# Patient Record
Sex: Female | Born: 1940 | Race: White | Hispanic: No | Marital: Married | Smoking: Never smoker
Health system: Southern US, Community
[De-identification: ages and names within clinical notes are randomized; demographics above are authoritative.]

## PROBLEM LIST (undated history)

## (undated) DIAGNOSIS — R32 Unspecified urinary incontinence: Secondary | ICD-10-CM

## (undated) DIAGNOSIS — H269 Unspecified cataract: Secondary | ICD-10-CM

## (undated) DIAGNOSIS — I10 Essential (primary) hypertension: Secondary | ICD-10-CM

## (undated) DIAGNOSIS — C801 Malignant (primary) neoplasm, unspecified: Secondary | ICD-10-CM

## (undated) DIAGNOSIS — M1990 Unspecified osteoarthritis, unspecified site: Secondary | ICD-10-CM

## (undated) DIAGNOSIS — J9809 Other diseases of bronchus, not elsewhere classified: Secondary | ICD-10-CM

## (undated) DIAGNOSIS — G629 Polyneuropathy, unspecified: Secondary | ICD-10-CM

## (undated) DIAGNOSIS — E785 Hyperlipidemia, unspecified: Secondary | ICD-10-CM

## (undated) DIAGNOSIS — I2699 Other pulmonary embolism without acute cor pulmonale: Secondary | ICD-10-CM

## (undated) DIAGNOSIS — R269 Unspecified abnormalities of gait and mobility: Secondary | ICD-10-CM

## (undated) DIAGNOSIS — E119 Type 2 diabetes mellitus without complications: Secondary | ICD-10-CM

## (undated) HISTORY — PX: BACK SURGERY: SHX140

## (undated) HISTORY — DX: Type 2 diabetes mellitus without complications: E11.9

## (undated) HISTORY — DX: Essential (primary) hypertension: I10

## (undated) HISTORY — PX: BLADDER SURGERY: SHX569

## (undated) HISTORY — DX: Hyperlipidemia, unspecified: E78.5

## (undated) HISTORY — DX: Unspecified osteoarthritis, unspecified site: M19.90

## (undated) HISTORY — PX: ABDOMINAL HYSTERECTOMY: SHX81

## (undated) HISTORY — PX: CHOLECYSTECTOMY: SHX55

## (undated) HISTORY — PX: EYE SURGERY: SHX253

## (undated) HISTORY — DX: Unspecified cataract: H26.9

## (undated) HISTORY — PX: APPENDECTOMY: SHX54

## (undated) HISTORY — PX: JOINT REPLACEMENT: SHX530

## (undated) HISTORY — PX: KNEE SURGERY: SHX244

---

## 2003-10-02 ENCOUNTER — Other Ambulatory Visit: Payer: Self-pay

## 2005-03-09 ENCOUNTER — Other Ambulatory Visit: Payer: Self-pay

## 2005-03-09 ENCOUNTER — Inpatient Hospital Stay: Payer: Self-pay | Admitting: Internal Medicine

## 2006-03-28 ENCOUNTER — Ambulatory Visit: Payer: Self-pay | Admitting: Podiatry

## 2006-03-28 ENCOUNTER — Other Ambulatory Visit: Payer: Self-pay

## 2006-04-01 ENCOUNTER — Ambulatory Visit: Payer: Self-pay | Admitting: Podiatry

## 2008-02-08 ENCOUNTER — Emergency Department: Payer: Self-pay | Admitting: Emergency Medicine

## 2009-01-10 ENCOUNTER — Ambulatory Visit: Payer: Self-pay | Admitting: General Surgery

## 2009-06-02 ENCOUNTER — Ambulatory Visit: Payer: Self-pay | Admitting: General Surgery

## 2009-09-28 ENCOUNTER — Ambulatory Visit: Payer: Self-pay | Admitting: Gynecologic Oncology

## 2009-09-30 ENCOUNTER — Ambulatory Visit: Payer: Self-pay | Admitting: Gynecologic Oncology

## 2009-10-29 ENCOUNTER — Ambulatory Visit: Payer: Self-pay | Admitting: Gynecologic Oncology

## 2009-11-04 ENCOUNTER — Ambulatory Visit: Payer: Self-pay | Admitting: Gynecologic Oncology

## 2009-11-06 ENCOUNTER — Observation Stay: Payer: Self-pay | Admitting: Obstetrics & Gynecology

## 2009-11-15 ENCOUNTER — Inpatient Hospital Stay: Payer: Self-pay | Admitting: Internal Medicine

## 2009-11-28 ENCOUNTER — Ambulatory Visit: Payer: Self-pay | Admitting: Gynecologic Oncology

## 2009-12-16 ENCOUNTER — Ambulatory Visit: Payer: Self-pay | Admitting: Gynecologic Oncology

## 2009-12-29 ENCOUNTER — Ambulatory Visit: Payer: Self-pay | Admitting: Gynecologic Oncology

## 2010-06-04 ENCOUNTER — Ambulatory Visit: Payer: Self-pay | Admitting: General Surgery

## 2010-08-27 ENCOUNTER — Ambulatory Visit: Payer: Self-pay | Admitting: Family Medicine

## 2010-09-01 ENCOUNTER — Ambulatory Visit: Payer: Self-pay | Admitting: Family Medicine

## 2010-09-11 ENCOUNTER — Ambulatory Visit: Payer: Self-pay | Admitting: Family Medicine

## 2010-09-24 ENCOUNTER — Ambulatory Visit: Payer: Self-pay | Admitting: Pain Medicine

## 2010-10-05 ENCOUNTER — Ambulatory Visit: Payer: Self-pay | Admitting: Pain Medicine

## 2010-11-10 ENCOUNTER — Ambulatory Visit: Payer: Self-pay | Admitting: Pain Medicine

## 2010-11-16 ENCOUNTER — Ambulatory Visit: Payer: Self-pay | Admitting: Pain Medicine

## 2010-12-09 ENCOUNTER — Other Ambulatory Visit (HOSPITAL_COMMUNITY): Payer: Self-pay | Admitting: Neurological Surgery

## 2010-12-09 ENCOUNTER — Encounter (HOSPITAL_COMMUNITY)
Admission: RE | Admit: 2010-12-09 | Discharge: 2010-12-09 | Disposition: A | Discharge status: Home / Self Care | Payer: Medicare Other | Source: Ambulatory Visit | Attending: Neurological Surgery | Admitting: Neurological Surgery

## 2010-12-09 ENCOUNTER — Ambulatory Visit (HOSPITAL_COMMUNITY)
Admission: RE | Admit: 2010-12-09 | Discharge: 2010-12-09 | Disposition: A | Discharge status: Home / Self Care | Payer: Medicare Other | Source: Ambulatory Visit | Attending: Neurological Surgery | Admitting: Neurological Surgery

## 2010-12-09 DIAGNOSIS — M5416 Radiculopathy, lumbar region: Secondary | ICD-10-CM

## 2010-12-09 DIAGNOSIS — M48061 Spinal stenosis, lumbar region without neurogenic claudication: Secondary | ICD-10-CM

## 2010-12-09 DIAGNOSIS — IMO0002 Reserved for concepts with insufficient information to code with codable children: Secondary | ICD-10-CM | POA: Insufficient documentation

## 2010-12-09 DIAGNOSIS — I517 Cardiomegaly: Secondary | ICD-10-CM | POA: Insufficient documentation

## 2010-12-09 DIAGNOSIS — Z01812 Encounter for preprocedural laboratory examination: Secondary | ICD-10-CM | POA: Insufficient documentation

## 2010-12-09 DIAGNOSIS — Z01818 Encounter for other preprocedural examination: Secondary | ICD-10-CM | POA: Insufficient documentation

## 2010-12-09 LAB — CBC
Hemoglobin: 14.1 g/dL (ref 12.0–15.0)
Platelets: 325 10*3/uL (ref 150–400)
RBC: 4.77 MIL/uL (ref 3.87–5.11)
WBC: 12.7 10*3/uL — ABNORMAL HIGH (ref 4.0–10.5)

## 2010-12-09 LAB — BASIC METABOLIC PANEL
CO2: 28 mEq/L (ref 19–32)
Calcium: 9.5 mg/dL (ref 8.4–10.5)
Chloride: 101 mEq/L (ref 96–112)
Glucose, Bld: 87 mg/dL (ref 70–99)
Potassium: 4.4 mEq/L (ref 3.5–5.1)
Sodium: 139 mEq/L (ref 135–145)

## 2010-12-09 LAB — SURGICAL PCR SCREEN
MRSA, PCR: POSITIVE — AB
Staphylococcus aureus: POSITIVE — AB

## 2010-12-10 ENCOUNTER — Inpatient Hospital Stay (HOSPITAL_COMMUNITY)
Admission: RE | Admit: 2010-12-10 | Discharge: 2010-12-14 | DRG: 491 | Disposition: A | Discharge status: Rehabilitation Facility | Payer: Medicare Other | Source: Ambulatory Visit | Attending: Neurological Surgery | Admitting: Neurological Surgery

## 2010-12-10 ENCOUNTER — Inpatient Hospital Stay (HOSPITAL_COMMUNITY): Payer: Medicare Other

## 2010-12-10 DIAGNOSIS — Z7982 Long term (current) use of aspirin: Secondary | ICD-10-CM

## 2010-12-10 DIAGNOSIS — E1142 Type 2 diabetes mellitus with diabetic polyneuropathy: Secondary | ICD-10-CM | POA: Diagnosis present

## 2010-12-10 DIAGNOSIS — M47817 Spondylosis without myelopathy or radiculopathy, lumbosacral region: Principal | ICD-10-CM | POA: Diagnosis present

## 2010-12-10 DIAGNOSIS — M48062 Spinal stenosis, lumbar region with neurogenic claudication: Secondary | ICD-10-CM | POA: Diagnosis present

## 2010-12-10 DIAGNOSIS — Z79899 Other long term (current) drug therapy: Secondary | ICD-10-CM

## 2010-12-10 DIAGNOSIS — I10 Essential (primary) hypertension: Secondary | ICD-10-CM | POA: Diagnosis present

## 2010-12-10 DIAGNOSIS — E1149 Type 2 diabetes mellitus with other diabetic neurological complication: Secondary | ICD-10-CM | POA: Diagnosis present

## 2010-12-10 DIAGNOSIS — Z01812 Encounter for preprocedural laboratory examination: Secondary | ICD-10-CM

## 2010-12-10 DIAGNOSIS — R339 Retention of urine, unspecified: Secondary | ICD-10-CM | POA: Diagnosis not present

## 2010-12-10 LAB — GLUCOSE, CAPILLARY
Glucose-Capillary: 102 mg/dL — ABNORMAL HIGH (ref 70–99)
Glucose-Capillary: 104 mg/dL — ABNORMAL HIGH (ref 70–99)
Glucose-Capillary: 96 mg/dL (ref 70–99)
Glucose-Capillary: 119 mg/dL — ABNORMAL HIGH (ref 70–99)

## 2010-12-11 DIAGNOSIS — IMO0002 Reserved for concepts with insufficient information to code with codable children: Secondary | ICD-10-CM

## 2010-12-11 DIAGNOSIS — M47817 Spondylosis without myelopathy or radiculopathy, lumbosacral region: Secondary | ICD-10-CM

## 2010-12-11 LAB — GLUCOSE, CAPILLARY
Glucose-Capillary: 154 mg/dL — ABNORMAL HIGH (ref 70–99)
Glucose-Capillary: 131 mg/dL — ABNORMAL HIGH (ref 70–99)
Glucose-Capillary: 164 mg/dL — ABNORMAL HIGH (ref 70–99)

## 2010-12-12 LAB — GLUCOSE, CAPILLARY: Glucose-Capillary: 169 mg/dL — ABNORMAL HIGH (ref 70–99)

## 2010-12-13 LAB — GLUCOSE, CAPILLARY
Glucose-Capillary: 139 mg/dL — ABNORMAL HIGH (ref 70–99)
Glucose-Capillary: 124 mg/dL — ABNORMAL HIGH (ref 70–99)
Glucose-Capillary: 109 mg/dL — ABNORMAL HIGH (ref 70–99)

## 2010-12-14 ENCOUNTER — Inpatient Hospital Stay (HOSPITAL_COMMUNITY)
Admission: AD | Admit: 2010-12-14 | Discharge: 2010-12-25 | DRG: 945 | Disposition: A | Discharge status: Home Health | Payer: Medicare Other | Source: Ambulatory Visit | Attending: Physical Medicine & Rehabilitation | Admitting: Physical Medicine & Rehabilitation

## 2010-12-14 DIAGNOSIS — G609 Hereditary and idiopathic neuropathy, unspecified: Secondary | ICD-10-CM | POA: Diagnosis present

## 2010-12-14 DIAGNOSIS — M48061 Spinal stenosis, lumbar region without neurogenic claudication: Secondary | ICD-10-CM | POA: Diagnosis present

## 2010-12-14 DIAGNOSIS — E118 Type 2 diabetes mellitus with unspecified complications: Secondary | ICD-10-CM | POA: Diagnosis present

## 2010-12-14 DIAGNOSIS — R339 Retention of urine, unspecified: Secondary | ICD-10-CM | POA: Diagnosis present

## 2010-12-14 DIAGNOSIS — N390 Urinary tract infection, site not specified: Secondary | ICD-10-CM | POA: Diagnosis not present

## 2010-12-14 DIAGNOSIS — K6289 Other specified diseases of anus and rectum: Secondary | ICD-10-CM | POA: Diagnosis not present

## 2010-12-14 DIAGNOSIS — IMO0002 Reserved for concepts with insufficient information to code with codable children: Secondary | ICD-10-CM | POA: Diagnosis present

## 2010-12-14 DIAGNOSIS — A4902 Methicillin resistant Staphylococcus aureus infection, unspecified site: Secondary | ICD-10-CM | POA: Diagnosis present

## 2010-12-14 DIAGNOSIS — Z5189 Encounter for other specified aftercare: Principal | ICD-10-CM

## 2010-12-14 DIAGNOSIS — I10 Essential (primary) hypertension: Secondary | ICD-10-CM | POA: Diagnosis present

## 2010-12-14 DIAGNOSIS — Z7982 Long term (current) use of aspirin: Secondary | ICD-10-CM

## 2010-12-14 DIAGNOSIS — M1710 Unilateral primary osteoarthritis, unspecified knee: Secondary | ICD-10-CM | POA: Diagnosis present

## 2010-12-14 DIAGNOSIS — F411 Generalized anxiety disorder: Secondary | ICD-10-CM | POA: Diagnosis not present

## 2010-12-14 LAB — URINALYSIS, ROUTINE W REFLEX MICROSCOPIC
Bilirubin Urine: NEGATIVE
Glucose, UA: NEGATIVE mg/dL
Nitrite: NEGATIVE
Specific Gravity, Urine: 1.014 (ref 1.005–1.030)
pH: 6 (ref 5.0–8.0)

## 2010-12-14 LAB — CBC
MCV: 90 fL (ref 78.0–100.0)
Platelets: 290 10*3/uL (ref 150–400)
RBC: 3.89 MIL/uL (ref 3.87–5.11)
WBC: 9.3 10*3/uL (ref 4.0–10.5)

## 2010-12-14 LAB — BASIC METABOLIC PANEL
CO2: 30 mEq/L (ref 19–32)
Chloride: 102 mEq/L (ref 96–112)
Sodium: 138 mEq/L (ref 135–145)

## 2010-12-14 LAB — GLUCOSE, CAPILLARY

## 2010-12-15 DIAGNOSIS — K592 Neurogenic bowel, not elsewhere classified: Secondary | ICD-10-CM

## 2010-12-15 DIAGNOSIS — IMO0002 Reserved for concepts with insufficient information to code with codable children: Secondary | ICD-10-CM

## 2010-12-15 DIAGNOSIS — M47817 Spondylosis without myelopathy or radiculopathy, lumbosacral region: Secondary | ICD-10-CM

## 2010-12-15 DIAGNOSIS — N319 Neuromuscular dysfunction of bladder, unspecified: Secondary | ICD-10-CM

## 2010-12-15 LAB — GLUCOSE, CAPILLARY: Glucose-Capillary: 149 mg/dL — ABNORMAL HIGH (ref 70–99)

## 2010-12-15 LAB — URINE CULTURE
Culture  Setup Time: 201207161439
Culture: NO GROWTH
Special Requests: NEGATIVE

## 2010-12-15 LAB — COMPREHENSIVE METABOLIC PANEL
ALT: 25 U/L (ref 0–35)
CO2: 31 mEq/L (ref 19–32)
Calcium: 9.4 mg/dL (ref 8.4–10.5)
Creatinine, Ser: 0.75 mg/dL (ref 0.50–1.10)
GFR calc Af Amer: >60 mL/min (ref >60)
GFR calc non Af Amer: >60 mL/min (ref >60)
Glucose, Bld: 118 mg/dL — ABNORMAL HIGH (ref 70–99)

## 2010-12-15 LAB — CBC
HCT: 37.5 % (ref 36.0–46.0)
Hemoglobin: 12.4 g/dL (ref 12.0–15.0)
MCH: 29.9 pg (ref 26.0–34.0)
MCV: 90.4 fL (ref 78.0–100.0)
RBC: 4.15 MIL/uL (ref 3.87–5.11)

## 2010-12-15 LAB — DIFFERENTIAL
Lymphs Abs: 3.7 10*3/uL (ref 0.7–4.0)
Monocytes Relative: 7 % (ref 3–12)
Neutro Abs: 4.9 10*3/uL (ref 1.7–7.7)
Neutrophils Relative %: 49 % (ref 43–77)

## 2010-12-16 DIAGNOSIS — K592 Neurogenic bowel, not elsewhere classified: Secondary | ICD-10-CM

## 2010-12-16 DIAGNOSIS — M47817 Spondylosis without myelopathy or radiculopathy, lumbosacral region: Secondary | ICD-10-CM

## 2010-12-16 DIAGNOSIS — N319 Neuromuscular dysfunction of bladder, unspecified: Secondary | ICD-10-CM

## 2010-12-16 DIAGNOSIS — IMO0002 Reserved for concepts with insufficient information to code with codable children: Secondary | ICD-10-CM

## 2010-12-16 LAB — GLUCOSE, CAPILLARY
Glucose-Capillary: 133 mg/dL — ABNORMAL HIGH (ref 70–99)
Glucose-Capillary: 127 mg/dL — ABNORMAL HIGH (ref 70–99)
Glucose-Capillary: 133 mg/dL — ABNORMAL HIGH (ref 70–99)

## 2010-12-17 LAB — GLUCOSE, CAPILLARY: Glucose-Capillary: 128 mg/dL — ABNORMAL HIGH (ref 70–99)

## 2010-12-18 ENCOUNTER — Inpatient Hospital Stay (HOSPITAL_COMMUNITY): Payer: Medicare Other

## 2010-12-18 DIAGNOSIS — K592 Neurogenic bowel, not elsewhere classified: Secondary | ICD-10-CM

## 2010-12-18 DIAGNOSIS — M47817 Spondylosis without myelopathy or radiculopathy, lumbosacral region: Secondary | ICD-10-CM

## 2010-12-18 DIAGNOSIS — IMO0002 Reserved for concepts with insufficient information to code with codable children: Secondary | ICD-10-CM

## 2010-12-18 DIAGNOSIS — N319 Neuromuscular dysfunction of bladder, unspecified: Secondary | ICD-10-CM

## 2010-12-18 LAB — GLUCOSE, CAPILLARY
Glucose-Capillary: 115 mg/dL — ABNORMAL HIGH (ref 70–99)
Glucose-Capillary: 108 mg/dL — ABNORMAL HIGH (ref 70–99)

## 2010-12-19 ENCOUNTER — Inpatient Hospital Stay (HOSPITAL_COMMUNITY): Payer: Medicare Other

## 2010-12-19 LAB — CBC
HCT: 36.8 % (ref 36.0–46.0)
Hemoglobin: 12.4 g/dL (ref 12.0–15.0)
RDW: 14 % (ref 11.5–15.5)
WBC: 12.4 10*3/uL — ABNORMAL HIGH (ref 4.0–10.5)

## 2010-12-19 LAB — URINALYSIS, ROUTINE W REFLEX MICROSCOPIC
Bilirubin Urine: NEGATIVE
Glucose, UA: NEGATIVE mg/dL
Hgb urine dipstick: NEGATIVE
Specific Gravity, Urine: 1.014 (ref 1.005–1.030)
pH: 6.5 (ref 5.0–8.0)

## 2010-12-19 LAB — COMPREHENSIVE METABOLIC PANEL
ALT: 22 U/L (ref 0–35)
AST: 19 U/L (ref 0–37)
Albumin: 2.7 g/dL — ABNORMAL LOW (ref 3.5–5.2)
Alkaline Phosphatase: 66 U/L (ref 39–117)
BUN: 20 mg/dL (ref 6–23)
Chloride: 101 mEq/L (ref 96–112)
Potassium: 3.9 mEq/L (ref 3.5–5.1)
Sodium: 138 mEq/L (ref 135–145)
Total Bilirubin: 0.6 mg/dL (ref 0.3–1.2)
Total Protein: 6.1 g/dL (ref 6.0–8.3)

## 2010-12-19 LAB — SEDIMENTATION RATE: Sed Rate: 27 mm/hr — ABNORMAL HIGH (ref 0–22)

## 2010-12-19 LAB — GLUCOSE, CAPILLARY
Glucose-Capillary: 141 mg/dL — ABNORMAL HIGH (ref 70–99)
Glucose-Capillary: 145 mg/dL — ABNORMAL HIGH (ref 70–99)

## 2010-12-19 LAB — URINE MICROSCOPIC-ADD ON

## 2010-12-19 LAB — CK TOTAL AND CKMB (NOT AT ARMC): Relative Index: 1.6 (ref 0.0–2.5)

## 2010-12-20 LAB — GLUCOSE, CAPILLARY
Glucose-Capillary: 123 mg/dL — ABNORMAL HIGH (ref 70–99)
Glucose-Capillary: 122 mg/dL — ABNORMAL HIGH (ref 70–99)
Glucose-Capillary: 139 mg/dL — ABNORMAL HIGH (ref 70–99)

## 2010-12-21 DIAGNOSIS — K592 Neurogenic bowel, not elsewhere classified: Secondary | ICD-10-CM

## 2010-12-21 DIAGNOSIS — M47817 Spondylosis without myelopathy or radiculopathy, lumbosacral region: Secondary | ICD-10-CM

## 2010-12-21 DIAGNOSIS — N319 Neuromuscular dysfunction of bladder, unspecified: Secondary | ICD-10-CM

## 2010-12-21 DIAGNOSIS — IMO0002 Reserved for concepts with insufficient information to code with codable children: Secondary | ICD-10-CM

## 2010-12-21 LAB — URINALYSIS, ROUTINE W REFLEX MICROSCOPIC
Hgb urine dipstick: NEGATIVE
Nitrite: NEGATIVE
Protein, ur: NEGATIVE mg/dL
Specific Gravity, Urine: 1.023 (ref 1.005–1.030)
Urobilinogen, UA: 1 mg/dL (ref 0.0–1.0)

## 2010-12-21 LAB — GLUCOSE, CAPILLARY
Glucose-Capillary: 112 mg/dL — ABNORMAL HIGH (ref 70–99)
Glucose-Capillary: 120 mg/dL — ABNORMAL HIGH (ref 70–99)
Glucose-Capillary: 137 mg/dL — ABNORMAL HIGH (ref 70–99)

## 2010-12-22 DIAGNOSIS — N319 Neuromuscular dysfunction of bladder, unspecified: Secondary | ICD-10-CM

## 2010-12-22 DIAGNOSIS — IMO0002 Reserved for concepts with insufficient information to code with codable children: Secondary | ICD-10-CM

## 2010-12-22 DIAGNOSIS — M47817 Spondylosis without myelopathy or radiculopathy, lumbosacral region: Secondary | ICD-10-CM

## 2010-12-22 DIAGNOSIS — K592 Neurogenic bowel, not elsewhere classified: Secondary | ICD-10-CM

## 2010-12-22 LAB — GLUCOSE, CAPILLARY: Glucose-Capillary: 137 mg/dL — ABNORMAL HIGH (ref 70–99)

## 2010-12-22 LAB — BASIC METABOLIC PANEL
BUN: 20 mg/dL (ref 6–23)
CO2: 25 mEq/L (ref 19–32)
Calcium: 9.4 mg/dL (ref 8.4–10.5)
Chloride: 104 mEq/L (ref 96–112)
Creatinine, Ser: 0.68 mg/dL (ref 0.50–1.10)

## 2010-12-22 LAB — CBC
HCT: 38.2 % (ref 36.0–46.0)
MCH: 29.3 pg (ref 26.0–34.0)
MCV: 89.5 fL (ref 78.0–100.0)
RBC: 4.27 MIL/uL (ref 3.87–5.11)
RDW: 14 % (ref 11.5–15.5)
WBC: 12.4 10*3/uL — ABNORMAL HIGH (ref 4.0–10.5)

## 2010-12-22 LAB — URINE CULTURE
Culture  Setup Time: 201207231920
Culture: NO GROWTH

## 2010-12-23 DIAGNOSIS — K592 Neurogenic bowel, not elsewhere classified: Secondary | ICD-10-CM

## 2010-12-23 DIAGNOSIS — M47817 Spondylosis without myelopathy or radiculopathy, lumbosacral region: Secondary | ICD-10-CM

## 2010-12-23 DIAGNOSIS — IMO0002 Reserved for concepts with insufficient information to code with codable children: Secondary | ICD-10-CM

## 2010-12-23 DIAGNOSIS — N319 Neuromuscular dysfunction of bladder, unspecified: Secondary | ICD-10-CM

## 2010-12-23 LAB — GLUCOSE, CAPILLARY
Glucose-Capillary: 201 mg/dL — ABNORMAL HIGH (ref 70–99)
Glucose-Capillary: 109 mg/dL — ABNORMAL HIGH (ref 70–99)

## 2010-12-24 LAB — GLUCOSE, CAPILLARY
Glucose-Capillary: 106 mg/dL — ABNORMAL HIGH (ref 70–99)
Glucose-Capillary: 181 mg/dL — ABNORMAL HIGH (ref 70–99)

## 2010-12-25 DIAGNOSIS — IMO0002 Reserved for concepts with insufficient information to code with codable children: Secondary | ICD-10-CM

## 2010-12-25 DIAGNOSIS — N319 Neuromuscular dysfunction of bladder, unspecified: Secondary | ICD-10-CM

## 2010-12-25 DIAGNOSIS — M47817 Spondylosis without myelopathy or radiculopathy, lumbosacral region: Secondary | ICD-10-CM

## 2010-12-25 DIAGNOSIS — K592 Neurogenic bowel, not elsewhere classified: Secondary | ICD-10-CM

## 2011-01-04 NOTE — Op Note (Signed)
NAME:  Susan Pratt, Susan Pratt NO.:  [id number]  MEDICAL RECORD NO.:  [id number]  LOCATION:  3528                         FACILITY:  [facility]  PHYSICIAN:  Stefani Dama, M.D.  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  PROCEDURE:  Bilateral laminotomies and foraminotomies L1-2, L2-3, L3-4, decompression of L1, L2, L3, and L4 nerve roots using microdissection technique and operating microscope.  SURGEON:  Stefani Dama, MD  FIRST ASSISTANT:  Cristi Loron, MD  ANESTHESIA:  General endotracheal.  INDICATIONS:  Susan Pratt is a 70 year old individual who has had significant and progressive bilateral leg weakness and pain that has been getting worse steadily over a long number of months.  She had recently an MRI completed, demonstrated severe spondylitic stenosis at L1-2, L2-3, and L3-4 and previously at L4-5, she had a decompression and this area appears patent nonetheless because of the severity of the spondylitic stenosis.  She is advised ultimately regarding surgical decompression via bilateral laminotomies and foraminotomies.  PROCEDURE:  The patient was brought to the operating room supine on the stretcher.  After smooth induction of general endotracheal anesthesia, she was turned prone.  The back was prepped with alcohol and DuraPrep and draped in a sterile fashion.  Midline incision was created in the lower lumbar spine.  This was carried down to the lumbar dorsal fascia, which was opened on either side of the midline and dissection then was taken down to the interlaminar space, was identified as L2-L3, L3-L4, and L1-2  after two localizing radiographs.  Interlaminar spaces were then isolated and a large curette was used to remove the outer layer of thickened redundant fascial tissue and some hypertrophied capsular material from the facet joints then laminotomies were created removing the inferior margin lamina of the respective vertebrae above out to the medial wall of the facet and a partial facetectomy was performed. Yellow ligament was taken up and at the level of L3-4, there was noted be some significant spondylitic changes.  At L1-2 bilaterally, there was a significant bony ridge with what appeared to be markedly degenerated disk material anteriorly.  The disk material itself was not disrupted. A generous foraminotomy was obtained and then ultimately, the individual nerve roots at L1-2, which was L1 and in the L2 nerve roots and then at L2-3, the L3 nerve root and the L2 nerve root again, and then at L3-4, the L3 nerve root and the L4 nerve root were each decompressed.  This was done under the operating microscope with Dr. Lovell Sheehan maintaining some retraction while I worked with the drill and 2 and 3-mm Kerrison punch to an adequate decompression.  In the end, the common dural tube and the L1, L2, L3, and L4 nerve roots were well decompressed out into lateral gutters.  Hemostasis was then carefully checked.  Central canal was also noted to be well decompressed and then  after copious irrigation with antibiotic irrigating solution, the lumbodorsal fascia was closed with #1 Vicryl in interrupted fashion, 2-0 Vicryl was used subcuticularly, and 3-0 Vicryl in the subcuticular skin.  The patient tolerated the procedure well, was returned to recovery room in stable condition.     Stefani Dama, M.D.     Merla Riches  D:  12/10/2010  T:  12/11/2010  Job:  [id number]  Electronically Signed by Barnett Abu M.D. on 01/04/2011 07:31:22 AM

## 2011-01-06 NOTE — Discharge Summary (Signed)
NAME:  Susan Pratt, Susan Pratt NO.:  [id number]  MEDICAL RECORD NO.:  [id number]  LOCATION:  4008                         FACILITY:  [facility]  PHYSICIAN:  Ranelle Oyster, M.D.DATE OF BIRTH:  1941-02-03  DATE OF ADMISSION:  12/14/2010 DATE OF DISCHARGE:  12/25/2010                              DISCHARGE SUMMARY   DISCHARGE DIAGNOSES: 1. Lumbar stenosis with radiculopathy status post bilateral     laminotomies, foraminotomies and decompression, December 10, 2010. 2. Pain management. 3. Anxiety. 4. Urinary retention. 5. Malignant hypertension. 6. 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 7. Diabetes mellitus.  HISTORY:  A 70 year old white female, admitted July 12, progressive low back pain, radiating lower extremities.  X-ray showed lumbar stenosis, radiculopathy, lumbar L1, L4, underwent bilateral laminotomies, foraminotomies, decompression of lumbar L1, L3 on July 12 per Dr. Danielle Pratt.  Postoperative pain management.  Placed on contact precautions for MRSA of nasal nares as well as Bactroban treatment through July 17, bouts of urinary retention.  A catheter had been replaced due to inability to void.  She is admitted for comprehensive rehab program.  PAST MEDICAL HISTORY:  See discharge diagnoses.  No alcohol or tobacco.  SOCIAL HISTORY:  She is married, retired.  Husband can assist.  No other local family.  Three-level home with bedroom upstairs.  ALLERGIES:  None.  HOME MEDICATIONS:  Lyrica, atenolol, Glucophage, Norvasc, oxybutynin, Pravachol, tramadol, aspirin.  FUNCTIONAL MOBILITY PRIOR TO HOSPITAL ADMISSION:  Independent with cane, walker.  FUNCTIONAL STATUS UPON ADMISSION REHAB SERVICES:  Moderate to max assist for functional mobility, activities of daily living.  PHYSICAL EXAMINATION:  VITAL SIGNS:  Blood pressure 136/80, pulse 74, respirations 18, temperature 98.5. GENERAL:  This was an alert female,  anxious. LUNGS:  Clear to auscultation. CARDIAC:  Regular rate and rhythm.  Strength 4-/5, upper extremities. Lower extremities, grossly 1-2/5 proximal with lot of pain inhibition, 3/5 distally with again some pain inhibiting her mobility.  HOSPITAL REHAB COURSE:  The patient was admitted to inpatient rehab services with therapies initiated on a 3-hour daily basis consisting of physical therapy, occupational therapy, and rehabilitation nursing.  The following issues were addressed during the patient's rehabilitation stay.  Pertaining to Susan Pratt' lumbar stenosis, radiculopathy, had undergone bilateral laminotomies, foraminotomies, decompression on July 12 per Dr. Danielle Pratt.  Surgical site healing nicely.  No signs of infection.  Functional mobility continued to improve, however, remained somewhat slow.  She remained on Robaxin for muscle spasm as well as oxycodone 5 mg 1-2 tablets every 6 hours as needed for her pain with relative good results.  She was scheduled OxyContin for better pain management 20 mg every 12 hours and tapered accordingly.  Topamax added 25 mg at bedtime for headaches with good results.  She did have some urinary retention, placed on Urecholine which was tapered again accordingly as she continued to show better voids.  Her blood pressures were well controlled on atenolol and Norvasc with no orthostatic changes noted.  She had been placed on Lyrica twice daily 300 mg with aid in her overall course.  Her blood sugars were well controlled.  She did have some anxiety related to her  urinary retention, received full counseling in regards to this and her recent back surgery, which discussed with the patient and her husband.  LATEST LABORATORIES:  Urine study to be no growth.  Sodium 137, potassium 4.5, BUN 20, creatinine 0.68.  Hemoglobin of 12.5, hematocrit 38.2, platelet 359,000.  The patient received weekly collaborative interdisciplinary team conferences to discuss  estimated length of stay, family teaching and any barriers to discharge.  She is minimal assist for her mobility.  Her functional status continued to improve.  She needed some encouragement at times to participate needing minimal assist for lower body activities of daily living.  DISCHARGE MEDICATIONS: 1. Robaxin 500 mg every 6 hours as needed muscle spasms, dispense of     60 tablets. 2. Oxycodone immediate release 5 mg 1-2 tablets every 6 hours as     needed pain, dispense of 60 tablets. 3. Tylenol as needed. 4. Colace 100 mg twice daily. 5. OxyContin sustained release 20 mg every 12 hours and taper as     directed. 6. Topamax 25 mg at bedtime. 7. Urecholine 50 mg 3 times daily and taper as directed. 8. Meloxicam 15 mg daily. 9. Atenolol 50 mg daily. 10.Norvasc 10 mg daily. 11.Aspirin 81 mg daily. 12.Pravachol 40 mg daily. 13.Lyrica 300 mg twice daily. 14.Glucophage 500 mg daily.  Her diet was a diabetic diet.  SPECIAL INSTRUCTIONS:  Back precautions as advised.  The patient should follow up with Dr. Barnett Pratt, Neurosurgery, call for appointment; Dr. Faith Pratt at the outpatient rehab service office as needed.  Home health therapies had been arranged as per rehab services.     Susan Pratt, P.A.   ______________________________ Ranelle Oyster, M.D.    DA/MEDQ  D:  12/29/2010  T:  12/29/2010  Job:  [id number]  cc:   Ranelle Oyster, M.D. Stefani Dama, M.D.  Electronically Signed by Susan Pratt P.A. on 12/30/2010 07:21:45 AM Electronically Signed by Susan Pratt M.D. on 01/06/2011 09:54:00 AM

## 2011-01-06 NOTE — H&P (Signed)
NAME:  Susan Pratt, Susan Pratt NO.:  [id number]  MEDICAL RECORD NO.:  [id number]  LOCATION:  4008                         FACILITY:  [facility]  PHYSICIAN:  Ranelle Oyster, M.D.DATE OF BIRTH:  26-Sep-1940  DATE OF ADMISSION:  12/14/2010 DATE OF DISCHARGE:                             HISTORY & PHYSICAL   CHIEF COMPLAINT:  Low back and leg pain.  HISTORY OF PRESENT ILLNESS:  This is a 70 year old white female admitted on December 10, 2010, with progressive low back pain radiating to the legs. X-rays showed lumbar stenosis, radiculopathy L1-L4.  She underwent bilateral laminotomies and foraminotomies and decompression of L1-L3 on December 10, 2010, by Dr. Danielle Dess.  Postoperatively, she has had problems with pain control and she tested positive for MRSA and has been put on contact precautions.  She is treated with Bactroban until December 15, 2010. She has had bouts of urinary retention and catheter was replaced today due to inability to void.  I was asked to evaluate her for rehab admission and felt she could benefit from the inpatient stay.  REVIEW OF SYSTEMS:  Notable for weakness, numbness, low back pain, and incontinence.  Full 12-point review is in the written H and P.  PAST MEDICAL HISTORY:  Positive for diabetes, hypertension, hyperlipidemia, osteoarthritis of the knees, hysterectomy, foot surgery.  She denies alcohol and tobacco.  FAMILY HISTORY:  Positive for CAD and diabetes.  SOCIAL HISTORY:  The patient is married and retired.  Husband can assist at home.  She has no other local family to assist.  She has three-level house with bedroom upstairs.  ALLERGIES:  None.  HOME MEDICATIONS:  Lyrica, atenolol, metformin, Norvasc, oxybutynin, Pravachol, tramadol, and aspirin.  LABORATORY DATA:  Hemoglobin 14, white count 12, platelets 325.  Sodium 139, potassium 4.4, BUN 26, creatinine 0.8.  PHYSICAL EXAMINATION:  VITAL SIGNS:  Blood pressure is 136/80, pulse  74, respiratory rate 18, temperature 98.5. GENERAL:  The patient is generally pleasant and some mild distress. HEENT:  Pupils equal, round, and reactive to light.  She is obese.  Nose and throat exam is unremarkable with fair dentition.  Pink moist mucosa. Neck is supple without JVD or lymphadenopathy. CHEST:  Clear to auscultation bilaterally without wheezes, rales, or rhonchi. HEART:  Regular rate and rhythm without murmur, rubs, or gallops. ABDOMEN:  Soft, nontender.  Bowel sounds are positive. BACK:  Well-approximated intact with no drainage seen today. NEUROLOGIC:  Cranial nerves II-XII normal.  Reflexes 1+.  Sensation is grossly intact.  Judgment, orientation, and memory were all appropriate. Mood was flat. MUSCULOSKELETAL:  Strength is 4-5/5 in the upper extremities.  No gross or focal findings were noted.  Lower extremity strength is grossly 1-2/5 proximally with lot of pain inhibition.  She is around 3/5 distally with again some pain inhibition noted.  Sensation was diminished in stocking- glove distribution over the distal limbs generally from below the knee down.  POST ADMISSION PHYSICIAN EVALUATION: 1. Functional deficit secondary to lumbar stenosis with radiculopathy     status post bilateral laminotomies, foraminotomies, and     decompression on December 10, 2010.  The patient likely with     preexisting peripheral neuropathy  exacerbating this condition as     well as arthritis in both of her knees. 2. The patient is admitted to receive collaborative interdisciplinary     care between the physiatrist rehab nursing staff and therapy team. 3. The patient's level of medical complexity and substantial therapy     needs in context of that medical necessity cannot be provided at a     lesser intensity of care.  Medical complexity in this case includes     patient's significant pain, urinary retention bleed, hypertension,     MRSA testing which has been positive and diabetes. 4.  The patient has experienced substantial functional loss from her     baseline.  Upon functional assessment within last 24 hours, she is     mod assist bed mobility and min to mod assist transfers, plus 2     total assist gait 15-25 feet, rolling walker, min assist upper     body, total assist lower body ADLs.  Previously, she was     independent with walking, but had been experiencing a decline for     several months.  Judging by patient's diagnosis, physical exam, and     functional history, she has potential for functional progress which     will result in measurable gains while an inpatient rehab.  The     gains will be of substantial and practical use upon discharge to     home facilitating mobility and self-care.  Interim changes in     medical status since my consult are detailed above. 5. The physiatrist provided 24-hour management of medical needs as     well as oversight of the therapy plans/treatment and provided     guidance as appropriate regarding interaction of the two.  Medical     problem list and plan are below. 6. A 24-hour rehab nursing team will assist in the management of     patient's skin care needs as well as bowel and bladder function,     safety awareness, nutrition, and integration of therapy concepts     techniques. 7. PT will assess and treat for lower extremity strength, range of     motion, adaptive techniques, equipment, functional mobility, safety     with goals modified independent to min assist. 8. OT will assess and treat for upper extremity use, ADLs, adaptive     techniques, equipment, functional mobility, safety upper extremity     strength, ADLs with goals, modified independent to min assist. 9. Case management and social worker will assess and treat for     psychosocial issues and discharge planning. 10.Team conference will be held weekly to assess progress towards     goals and to determine barriers at discharge. 11.The patient has demonstrated  sufficient medical stability and     exercise capacity to tolerate at least 3 hours therapy per day at     least 5 days per week. 12.Estimated length of stay is 2 weeks to 2-1/2 weeks.  Prognosis is     good.  The patient seems to be fairly motivated.  MEDICAL PROBLEM LIST AND PLAN: 1. Deep vein thrombosis prophylaxis, thigh-high TED hose and aspirin.     Follow up for any signs and symptoms of thrombus.  Encourage     mobility.  Consider screening Doppler test. 2. Pain management with meloxicam, Robaxin, Lyrica, and oxycodone.     Consider long-acting agent or re-scheduling of her narcotics to     improve activity  tolerance. 3. Mood:  The patient will need some ego supportive dialogue from     staff. 4. Urinary retention:  This may be partly neurogenic.  Also may be     induced by medications.  We will stop patient's Ditropan which was     on previously.  Check PVRs.  Discontinue Foley in the morning or     shortly thereafter. 5. Malignant hypertension:  Norvasc and atenolol on board.  We will     monitor with pain levels as well as increased activity and adjust     as needed. 6. Positive methicillin-resistant Staphylococcus aureus, nares:     Contact precautions until discharge. 7. Diabetes mellitus:  Check CBCs, a.c. and at bedtime.  Follow up for     intake and activity effects on glucose levels.  Glucophage 500 mg     daily on board.     Ranelle Oyster, M.D.     ZTS/MEDQ  D:  12/14/2010  T:  12/15/2010  Job:  [id number]  Electronically Signed by Faith Rogue M.D. on 01/06/2011 09:54:16 AM

## 2011-01-12 DIAGNOSIS — I10 Essential (primary) hypertension: Secondary | ICD-10-CM | POA: Insufficient documentation

## 2011-01-12 DIAGNOSIS — M159 Polyosteoarthritis, unspecified: Secondary | ICD-10-CM | POA: Insufficient documentation

## 2011-01-12 DIAGNOSIS — E1140 Type 2 diabetes mellitus with diabetic neuropathy, unspecified: Secondary | ICD-10-CM | POA: Insufficient documentation

## 2011-01-12 DIAGNOSIS — G629 Polyneuropathy, unspecified: Secondary | ICD-10-CM | POA: Insufficient documentation

## 2011-01-12 DIAGNOSIS — R32 Unspecified urinary incontinence: Secondary | ICD-10-CM | POA: Insufficient documentation

## 2011-01-12 DIAGNOSIS — M549 Dorsalgia, unspecified: Secondary | ICD-10-CM | POA: Insufficient documentation

## 2011-01-12 DIAGNOSIS — G609 Hereditary and idiopathic neuropathy, unspecified: Secondary | ICD-10-CM | POA: Insufficient documentation

## 2011-01-12 DIAGNOSIS — G4700 Insomnia, unspecified: Secondary | ICD-10-CM | POA: Insufficient documentation

## 2011-01-12 DIAGNOSIS — E1142 Type 2 diabetes mellitus with diabetic polyneuropathy: Secondary | ICD-10-CM | POA: Insufficient documentation

## 2011-03-23 ENCOUNTER — Ambulatory Visit: Payer: Self-pay | Admitting: Family Medicine

## 2011-04-20 ENCOUNTER — Ambulatory Visit: Payer: Self-pay | Admitting: Family Medicine

## 2011-06-08 ENCOUNTER — Ambulatory Visit: Payer: Self-pay | Admitting: General Surgery

## 2011-07-13 ENCOUNTER — Ambulatory Visit: Payer: Self-pay | Admitting: Orthopedic Surgery

## 2011-08-09 ENCOUNTER — Ambulatory Visit: Payer: Self-pay | Admitting: Orthopedic Surgery

## 2011-08-09 LAB — CBC
Platelet: 283 10*3/uL (ref 150–440)
RBC: 4.74 10*6/uL (ref 3.80–5.20)
RDW: 14.3 % (ref 11.5–14.5)
WBC: 10.2 10*3/uL (ref 3.6–11.0)

## 2011-08-09 LAB — BASIC METABOLIC PANEL
Anion Gap: 11 (ref 7–16)
BUN: 24 mg/dL — ABNORMAL HIGH (ref 7–18)
Co2: 27 mmol/L (ref 21–32)
Creatinine: 0.7 mg/dL (ref 0.60–1.30)
EGFR (African American): >60
EGFR (Non-African Amer.): >60
Glucose: 100 mg/dL — ABNORMAL HIGH (ref 65–99)

## 2011-08-09 LAB — PROTIME-INR: INR: 0.8

## 2011-08-26 ENCOUNTER — Inpatient Hospital Stay: Payer: Self-pay | Admitting: Orthopedic Surgery

## 2011-08-26 LAB — CREATININE, SERUM
Creatinine: 0.86 mg/dL (ref 0.60–1.30)
EGFR (Non-African Amer.): >60

## 2011-08-26 LAB — HEMOGLOBIN: HGB: 12.7 g/dL (ref 12.0–16.0)

## 2011-08-27 LAB — BASIC METABOLIC PANEL
Anion Gap: 10 (ref 7–16)
Co2: 24 mmol/L (ref 21–32)
Osmolality: 277 (ref 275–301)
Potassium: 4.7 mmol/L (ref 3.5–5.1)

## 2011-08-27 LAB — HEMOGLOBIN: HGB: 11.8 g/dL — ABNORMAL LOW (ref 12.0–16.0)

## 2011-08-30 ENCOUNTER — Encounter: Payer: Self-pay | Admitting: Internal Medicine

## 2011-09-29 ENCOUNTER — Encounter: Payer: Self-pay | Admitting: Internal Medicine

## 2011-12-07 ENCOUNTER — Encounter: Payer: Self-pay | Admitting: Orthopedic Surgery

## 2011-12-13 ENCOUNTER — Emergency Department: Payer: Self-pay | Admitting: Emergency Medicine

## 2011-12-13 LAB — URINALYSIS, COMPLETE
Blood: NEGATIVE
Nitrite: NEGATIVE
Ph: 8 (ref 4.5–8.0)
Protein: 30
Specific Gravity: 1.011 (ref 1.003–1.030)
WBC UR: 11 /HPF (ref 0–5)

## 2011-12-15 LAB — URINE CULTURE

## 2011-12-18 ENCOUNTER — Ambulatory Visit: Payer: Self-pay | Admitting: Urology

## 2011-12-18 ENCOUNTER — Inpatient Hospital Stay: Payer: Self-pay | Admitting: Internal Medicine

## 2011-12-18 LAB — URINALYSIS, COMPLETE
Glucose,UR: NEGATIVE mg/dL (ref 0–75)
Ketone: NEGATIVE
Nitrite: NEGATIVE
Specific Gravity: 1.004 (ref 1.003–1.030)
Squamous Epithelial: 30
WBC UR: 2737 /HPF (ref 0–5)

## 2011-12-18 LAB — CBC
Platelet: 390 10*3/uL (ref 150–440)
RDW: 15 % — ABNORMAL HIGH (ref 11.5–14.5)
WBC: 12.1 10*3/uL — ABNORMAL HIGH (ref 3.6–11.0)

## 2011-12-18 LAB — COMPREHENSIVE METABOLIC PANEL
Alkaline Phosphatase: 94 U/L (ref 50–136)
Bilirubin,Total: 0.8 mg/dL (ref 0.2–1.0)
Calcium, Total: 9.1 mg/dL (ref 8.5–10.1)
Co2: 33 mmol/L — ABNORMAL HIGH (ref 21–32)
EGFR (Non-African Amer.): >60
SGPT (ALT): 22 U/L
Sodium: 141 mmol/L (ref 136–145)

## 2011-12-19 LAB — CBC WITH DIFFERENTIAL/PLATELET
Basophil #: 0.1 10*3/uL (ref 0.0–0.1)
Eosinophil #: 0.2 10*3/uL (ref 0.0–0.7)
Lymphocyte %: 25.5 %
MCH: 28.2 pg (ref 26.0–34.0)
MCHC: 31.9 g/dL — ABNORMAL LOW (ref 32.0–36.0)
MCV: 88 fL (ref 80–100)
Neutrophil %: 63.9 %
Platelet: 352 10*3/uL (ref 150–440)
RBC: 4.62 10*6/uL (ref 3.80–5.20)
RDW: 14.7 % — ABNORMAL HIGH (ref 11.5–14.5)

## 2011-12-24 LAB — CULTURE, BLOOD (SINGLE)

## 2012-02-01 DIAGNOSIS — N3946 Mixed incontinence: Secondary | ICD-10-CM | POA: Insufficient documentation

## 2012-02-01 DIAGNOSIS — R339 Retention of urine, unspecified: Secondary | ICD-10-CM | POA: Insufficient documentation

## 2012-02-07 DIAGNOSIS — N3000 Acute cystitis without hematuria: Secondary | ICD-10-CM | POA: Insufficient documentation

## 2012-06-26 ENCOUNTER — Emergency Department: Payer: Self-pay | Admitting: Emergency Medicine

## 2012-06-26 LAB — COMPREHENSIVE METABOLIC PANEL
Calcium, Total: 9.4 mg/dL (ref 8.5–10.1)
EGFR (Non-African Amer.): >60
Glucose: 99 mg/dL (ref 65–99)
Osmolality: 279 (ref 275–301)
Sodium: 139 mmol/L (ref 136–145)
Total Protein: 7.5 g/dL (ref 6.4–8.2)

## 2012-06-26 LAB — URINALYSIS, COMPLETE
Glucose,UR: NEGATIVE mg/dL (ref 0–75)
Ketone: NEGATIVE
Nitrite: NEGATIVE
Ph: 7 (ref 4.5–8.0)
Protein: NEGATIVE

## 2012-06-26 LAB — CBC
MCHC: 32.6 g/dL (ref 32.0–36.0)
MCV: 90 fL (ref 80–100)
Platelet: 359 10*3/uL (ref 150–440)
WBC: 12.1 10*3/uL — ABNORMAL HIGH (ref 3.6–11.0)

## 2012-09-04 ENCOUNTER — Emergency Department: Payer: Self-pay | Admitting: Emergency Medicine

## 2012-09-04 LAB — COMPREHENSIVE METABOLIC PANEL
Albumin: 3.1 g/dL — ABNORMAL LOW (ref 3.4–5.0)
Alkaline Phosphatase: 79 U/L (ref 50–136)
Bilirubin,Total: 1 mg/dL (ref 0.2–1.0)
Creatinine: 0.69 mg/dL (ref 0.60–1.30)
Osmolality: 278 (ref 275–301)
SGOT(AST): 17 U/L (ref 15–37)
Total Protein: 6.8 g/dL (ref 6.4–8.2)

## 2012-09-04 LAB — MAGNESIUM: Magnesium: 1.9 mg/dL

## 2012-09-04 LAB — CBC
RBC: 4.9 10*6/uL (ref 3.80–5.20)
RDW: 13.9 % (ref 11.5–14.5)
WBC: 10.7 10*3/uL (ref 3.6–11.0)

## 2012-09-04 LAB — PRO B NATRIURETIC PEPTIDE: B-Type Natriuretic Peptide: 105 pg/mL (ref 0–125)

## 2012-11-09 DIAGNOSIS — Z8542 Personal history of malignant neoplasm of other parts of uterus: Secondary | ICD-10-CM | POA: Insufficient documentation

## 2012-11-09 DIAGNOSIS — E785 Hyperlipidemia, unspecified: Secondary | ICD-10-CM | POA: Insufficient documentation

## 2012-11-09 DIAGNOSIS — R7303 Prediabetes: Secondary | ICD-10-CM | POA: Insufficient documentation

## 2014-01-24 DIAGNOSIS — M7710 Lateral epicondylitis, unspecified elbow: Secondary | ICD-10-CM | POA: Insufficient documentation

## 2014-01-24 DIAGNOSIS — M75100 Unspecified rotator cuff tear or rupture of unspecified shoulder, not specified as traumatic: Secondary | ICD-10-CM | POA: Insufficient documentation

## 2014-01-24 DIAGNOSIS — J9809 Other diseases of bronchus, not elsewhere classified: Secondary | ICD-10-CM

## 2014-01-24 DIAGNOSIS — J398 Other specified diseases of upper respiratory tract: Secondary | ICD-10-CM | POA: Insufficient documentation
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
## 2014-09-17 NOTE — H&P (Signed)
PATIENT NAME:  Susan Pratt, Susan Pratt MR#:  [id number] DATE OF BIRTH:  01/17/1941  DATE OF ADMISSION:  12/18/2011  PRIMARY CARE PHYSICIAN: Ezequiel Kayser, MD  CHIEF COMPLAINT: Urinary incontinence.   HISTORY OF PRESENT ILLNESS: This is a 74 year old female who is pretty much bedbound after back surgery and knee surgery who presented actually on 12/13/2011 to the ER with urinary incontinence. She was diagnosed with a urinary tract infection and sent home on ciprofloxacin. Urine cultures from that time are growing out Escherichia coli and Providencia, both sensitive to ciprofloxacin. However, she continues to have ongoing urinary incontinence so was sent here for further evaluation. In the Emergency Room, a urinalysis was performed. It does appear that she still has a urinary tract infection and she has more white blood cells than previously.   In the Emergency Room, a Foley was placed and she had 50 mL of frank pus with approximately 300 mL of retained urine.   REVIEW OF SYSTEMS: CONSTITUTIONAL: She denies any fever or fatigue. EYES: No blurred or double vision. Positive cataracts. ENT: No ear pain, hearing loss, seasonal allergies, postnasal drip, or snoring. RESPIRATORY: Denies any coughing, wheezing, hemoptysis, dyspnea, or chronic obstructive pulmonary disease. CARDIOVASCULAR: Denies chest pain, orthopnea, edema, arrhythmia, palpitations, or syncope. GI: No nausea, vomiting, diarrhea, abdominal pain, melena, or ulcers. GENITOURINARY: She has no dysuria. She cannot feel from her belly button down. She does have frank pus, as mentioned, and urinary incontinence. ENDOCRINE: No polyuria or polydipsia. HEME/LYMPH: No easy bruising. SKIN: No rash or lesions. MUSCULOSKELETAL: She is bedbound. She has peripheral neuropathy. NEURO: No history of CVA, transient ischemic attack, or seizures.   PAST MEDICAL HISTORY:  1. Diabetes.  2. Bedbound.  3. Hyperlipidemia.  4. History of MRSA.  5. Endometrial cancer.   6. Arthritis.  7. Hypertension.  8. Peripheral neuropathy.   PAST SURGICAL HISTORY:  1. Right total knee.  2. Carpal tunnel release.  3. Cataract surgery.  4. Foot surgery, both right and left.  5. Cholecystectomy.  6. Tubal ligation.  7. Three back surgeries.   ALLERGIES: Lisinopril causes swelling.   SOCIAL HISTORY: No tobacco, alcohol, or drug use. She lives with her husband.   FAMILY HISTORY: Positive for coronary artery disease at age 4. Her mother lived to be 56.   PHYSICAL EXAMINATION:   VITAL SIGNS: Temperature 97.7, pulse 88, respirations 18, blood pressure 94/66, and saturation 98% on room air.   GENERAL: The patient is alert and oriented, not in acute distress.   HEENT: Head is atraumatic. Pupils are round and reactive. Sclerae anicteric. Mucous membranes are moist. Oropharynx is clear.   NECK: Supple without JVD, carotid bruit, or enlarged thyroid.  CARDIOVASCULAR: Regular rate and rhythm. No murmurs, gallops, or rubs. PMI is not displaced.  ABDOMEN: Obese, bowel sounds are positive, nontender. Hard to appreciate organomegaly due to body habitus.   EXTREMITIES: No clubbing, cyanosis, or edema.   NEURO: Cranial nerves II through XII grossly intact.   SENSATION: The patient has no sensation from her waist down. She is unable to move her legs or wiggle her toes. Upper extremities are 4/5 bilaterally and symmetrically.   SKIN: No rash or lesions.   LABORATORY, DIAGNOSTIC AND RADIOLOGIC DATA: Urinalysis shows 1+ leukocyte esterase, 2737 white blood cells, 110 red blood cells, and 3+ bacteria.  Sodium 141, potassium 3.8, chloride 103, bicarbonate 33, BUN 11, creatinine 0.74, and glucose 124. ALT 22, AST 25, total protein 7.2, and albumin 2.9.   White blood cells  12, hemoglobin 15.1, hematocrit 46, and platelets 390.   Urine cultures from 12/13/2011 were positive for Escherichia coli and Providencia sensitive to ciprofloxacin and Rocephin.   ASSESSMENT AND  PLAN: This is a 74 year old female who is bed bound, who came in on 12/13/2011 with urinary continence and found to have Escherichia coli and Providencia urinary tract infection and was sent home on ciprofloxacin. She presents again with urinary incontinence and again found to have a urinary tract infection and SIRS syndrome.  1. SIRS secondary to urinary tract infection. We will provide some IV fluids and antibiotics to treat the urinary tract infection.  2. Urinary tract infection. The patient had urine cultures just on 12/13/2011. She was on ciprofloxacin which both of these seemed sensitive to. I will go ahead and start Rocephin and repeat the urine culture and due to her low blood pressure we will also go ahead and order two blood cultures. We will also consult urology since she has some pus in her urine. She may need a chronic Foley. This is to be decided by the urologist.  3. Diabetes. For now I will place the patient on an ADA diet and sliding scale insulin. She does not know her medications. So, once we obtain those, we can restart her medications.  4. Hypertension. The patient is now hypotensive. Obviously we will hold any medications and provide IV fluids for her low blood pressure.  5. Peripheral neuropathy. We will continue her outpatient medication once known.  6. Hyperlipidemia. Again once we know the medication we may restart this.      CODE STATUS: The patient is DO NOT RESUSCITATE status.   TIME SPENT: Approximately 45 minutes.  ____________________________ Donell Beers. Benjie Karvonen, MD spm:slb D: 12/18/2011 14:26:40 ET T: 12/18/2011 14:53:41 ET JOB#: [id number]  cc: Marquavis Hannen P. Benjie Karvonen, MD, <Dictator> Christena Flake. Raechel Ache, MD Donell Beers Demetra Moya MD ELECTRONICALLY SIGNED 12/18/2011 18:32

## 2014-09-17 NOTE — Consult Note (Signed)
Brief Urology Consultation Report for Consultation: "UTI with Pus in the Bladder" MD: Sital P. Electra, M.D.Ezequiel Kayser, M.D. MD: Darcella Cheshire, M.D.  UTI/Pyocystis - may be due to chronic voiding inefficiency. Urinary Incontinence - this is the reason the pt presented to the ER today (worsening enuresis last night)with a 2 month h/o progressive urge urinary incontinence (4ppd), exacerbated by the E. coli UTI for the past 2 weeks -pt reports incomplete bladder emptying with an I&O cath 2 months ago, as well as a h/o incomplete bladder emptying following her 3rd lumbar back surgery last summer  -the incomplete bladder emptying is likely multifactorial (DM, DDD, immobility) Inefficient Voiding (see #2)  Keep foley for now.remains afebrile, consider voiding trial in the AM with serial bladder scans (or I&O cath's).remain catheter-free in the PVR is <184mL and the PVR is <=50% of the voided volume. the pt does not pass the voiding trial, then consider intermittent catheterization bid (or more often as needed to prevent urge incontinence and prevent bladder overdistention >564mL (voided volume + PVR); husband already knows how to perform) Complete 7-10 days of appropriate antibacterial therapy. F/U with Dr. Charyl Dancer of [place] [phone]) you for the opportunity to participate in the care of your patient.follow with you.  Electronic Signatures: Darcella Cheshire (MD)  (Signed on 20-Jul-13 18:59)  Authored  Last Updated: 20-Jul-13 18:59 by Darcella Cheshire (MD)

## 2014-09-17 NOTE — Consult Note (Signed)
Chief Complaint:   Subjective/Chief Complaint Urology F/U  Pt without new complaints.   VITAL SIGNS/ANCILLARY NOTES: **Vital Signs.:   21-Jul-13 07:15   Vital Signs Type Routine   Temperature Temperature (F) 98.2   Celsius 36.7   Temperature Source oral   Pulse Pulse 82   Respirations Respirations 18   Systolic BP Systolic BP 580   Diastolic BP (mmHg) Diastolic BP (mmHg) 82   Mean BP 102   Pulse Ox % Pulse Ox % 96   Pulse Ox Activity Level  At rest   Oxygen Delivery Room Air/ 21 %  *Intake and Output.:   Daily 21-Jul-13 07:00   Grand Totals Intake:   Output:  1250    Net:  -1250 24 Hr.:  -1250   Urine ml     Out:  1250   Length of Stay Totals Intake:   Output:  1250    Net:  -1250    Shift 15:00   Grand Totals Intake:   Output:  1375    Net:  -9983 38 Hr.:  -1375   Urine ml     Out:  1375   Length of Stay Totals Intake:   Output:  2625    Net:  -2625   Brief Assessment:   Additional Physical Exam WDWN WF in NAD  nL Resp Effort   Lab Results: Hepatic:  20-Jul-13 11:37    Bilirubin, Total 0.8   Alkaline Phosphatase 94   SGPT (ALT) 22 (12-78 NOTE: NEW REFERENCE RANGE 04/23/2011)   SGOT (AST) 25   Total Protein, Serum 7.2   Albumin, Serum  2.9  Routine Micro:  20-Jul-13 12:02    Micro Text Report URINE CULTURE   ORGANISM 1                >100,000 CFU/ML UNIDENTIFIED ORGANISM   COMMENT                   ID TO FOLLOW SENSITIVITIES TO FOLLOW   ANTIBIOTIC                        Culture Comment ID TO FOLLOW SENSITIVITIES TO FOLLOW  Result(s) reported on 19 Dec 2011 at 01:28PM.   Organism Name UNIDENTIFIED ORGANISM   Specimen Source CLEAN CATCH URINE   Organism 1 >100,000 CFU/ML UNIDENTIFIED ORGANISM    13:25    Micro Text Report BLOOD CULTURE   COMMENT                   NO GROWTH IN 18-24 HOURS   ANTIBIOTIC                        Culture Comment NO GROWTH IN 18-24 HOURS  Result(s) reported on 19 Dec 2011 at 07:50AM.    13:35    Micro Text  Report BLOOD CULTURE   COMMENT                   NO GROWTH IN 18-24 HOURS   ANTIBIOTIC                        Culture Comment NO GROWTH IN 18-24 HOURS  Result(s) reported on 19 Dec 2011 at 07:50AM.  Routine Chem:  20-Jul-13 11:37    Glucose, Serum  124   BUN 11   Creatinine (comp) 0.74   Sodium, Serum 141   Potassium, Serum 3.8  Chloride, Serum 103   CO2, Serum  33   Calcium (Total), Serum 9.1   Osmolality (calc) 282   eGFR (African American) >60   eGFR (Non-African American) >60 (eGFR values <52m/min/1.73 m2 may be an indication of chronic kidney disease (CKD). Calculated eGFR is useful in patients with stable renal function. The eGFR calculation will not be reliable in acutely ill patients when serum creatinine is changing rapidly. It is not useful in  patients on dialysis. The eGFR calculation may not be applicable to patients at the low and high extremes of body sizes, pregnant women, and vegetarians.)   Anion Gap  5  Routine UA:  20-Jul-13 12:02    Color (UA) Colorless   Clarity (UA) Turbid   Glucose (UA) Negative   Bilirubin (UA) Negative   Ketones (UA) Negative   Specific Gravity (UA) 1.004   Blood (UA) 1+   pH (UA) 7.0   Protein (UA) 100 mg/dL   Nitrite (UA) Negative   Leukocyte Esterase (UA) 1+ (Result(s) reported on 18 Dec 2011 at 12:35PM.)   RBC (UA) 110 /HPF   WBC (UA) 2737 /HPF   Bacteria (UA) 3+   Epithelial Cells (UA) 30 /HPF   Mucous (UA) PRESENT   Amorphous Crystal (UA) PRESENT (Result(s) reported on 18 Dec 2011 at 12:35PM.)  Routine Hem:  20-Jul-13 11:37    WBC (CBC)  12.1   RBC (CBC) 5.19   Hemoglobin (CBC) 15.1   Hematocrit (CBC) 45.9   Platelet Count (CBC) 390 (Result(s) reported on 18 Dec 2011 at 12:08PM.)   MCV 88   MCH 29.0   MCHC 32.8   RDW  15.0   Assessment/Plan:  Invasive Device Daily Assessment of Necessity:   Does the patient currently have any of the following indwelling devices? foley    Indwelling Urinary Catheter  continued, requirement due to    Reason to continue Indwelling Urinary Catheter inability to void as documented by bladder scanning  long term catheterization has already been initiated   Assessment/Plan:   Assessment 1. UTI/Pyocystis - responding well to antibiotics with foley catheter bladder decompression.  2. Urinary Incontinence - likely due to chronic voiding inefficiency and exacerbated by acute cystitis  3. Inefficient Voiding - likely multifactorial (DM, DDD, immobility, pharmacologic)     -pt now has her medications that she had been taking at home prior to admission      -these include Toviaz 810mpo qHS, Imipramine 2539mo qHS, and Cyclobenzaprine 5mg8m q8hrs prn       -d/w the pt, the potential negative effect of these medications on bladder emptying and recommended gradual, serial discontinuation of these medications (medications on discharge after her TR[facility] [place]013 that she is no longer taking include Dulculax supp, Antacid, Senekot, Xarelto, Oxycontin, Oxycodone, Tylenol prn) -re-d/w pt, the management of her urinary retention as the medications that inhibit bladder emptying are gradually discontinued - she has decided to stay with an indwelling foley with monthly changes/voiding trials  -she understands that the risk of recurrent UTI's is greater with an indwelling catheter vs intermittent catheterization   -the pt also informs me today that she had seen Dr. BriaEdrick Oh month ago and would like to continue long-term f/u with him after discharge.    Plan 1. Keep foley (pt opts for a chronic, indwelling foley catheter for now) 2. Complete a 14 day course of antibiotics (please change the foley right before discharge) 3. Discontinue Toviaz (d/w pt) 4. Follow up with Dr.  Edrick Oh after discharge.  Will sign off.   Electronic Signatures: Darcella Cheshire (MD)  (Signed 21-Jul-13 14:34)  Authored: Chief Complaint, VITAL SIGNS/ANCILLARY NOTES, Brief Assessment, Lab Results,  Assessment/Plan   Last Updated: 21-Jul-13 14:34 by Darcella Cheshire (MD)

## 2014-09-17 NOTE — Discharge Summary (Signed)
PATIENT NAME:  Susan Pratt, Susan Pratt MR#:  [id number] DATE OF BIRTH:  1941-04-19  DATE OF ADMISSION:  12/18/2011 DATE OF DISCHARGE:  12/21/2011  PRIMARY CARE PHYSICIAN:  Ezequiel Kayser, MD  CONSULTANTS: Delma Officer, MD - Urology.  DISCHARGE DIAGNOSES:  1. Urinary tract infection and pyocystitis. 2. Urinary incontinence.  3. Hypertension.  4. Diabetes.   CONDITION: Stable.   MEDICATIONS: Please refer to the [facility] physician discharge instructions.  NEW MEDICATION: Augmentin 500 mg p.o. twice a day for 14 days.  DISCHARGE INSTRUCTIONS: The patient will need home health.   ACTIVITY: As tolerated.   DISCHARGE FOLLOWUP: Followup with PCP within 1 to 2 weeks. Follow-up with Dr. Jacqlyn Larsen, urology, within 1 to 2 weeks.   HOSPITAL COURSE: The patient is a 74 year old Caucasian female with history of back and knee surgery, hypertension, and bedbound who presented to the ED with urinary incontinence. The patient was diagnosed with UTI and sent home with Cipro. Urine culture showed Escherichia coli and Providencia both sensitive to Cipro; however, she continues to have ongoing urinary incontinence so she came to the ED for further evaluation. She had pus in the urine, in the ED, and has been treated with Rocephin IVPB. Dr. Maudie Mercury evaluated the patient and recommended chronic Foley catheter placement and follow up with Dr. Jacqlyn Larsen as an outpatient. In addition, the patient needs to change the Foley once a month. After admission the patient's urine became clear.  She denies any fever or chills. Her initial urinalysis showed WBCs 2737 with 110 red cells and 3+ bacteria. Urine culture showed resistant to Cipro but sensitive to ampicillin so the patient will be discharged with Augmentin for two weeks.   For diabetes the patient has been treated with ADA diet and sliding scale.   Hypertension has been controlled. The patient is clinically stable and will be discharged home today. I discussed the discharge plan with the patient,  the patient's husband, case Freight forwarder and the nurse.   TIME SPENT: About 35 minutes.  ____________________________ Demetrios Loll, MD qc:slb D: 12/21/2011 13:58:38 ET T: 12/22/2011 10:21:16 ET JOB#: [id number]  cc: Demetrios Loll, MD, <Dictator> Christena Flake. Raechel Ache, MD Demetrios Loll MD ELECTRONICALLY SIGNED 12/22/2011 14:50

## 2014-09-22 NOTE — Op Note (Signed)
PATIENT NAME:  Susan Pratt, Susan Pratt MR#:  [id number] DATE OF BIRTH:  06/27/1940  DATE OF PROCEDURE:  08/26/2011  PREOPERATIVE DIAGNOSIS: Severe right knee osteoarthritis.   POSTOPERATIVE DIAGNOSIS: Severe right knee osteoarthritis.   PROCEDURE: Right total knee replacement.   ANESTHESIA: General.   SURGEON: Laurene Footman, MD   ASSISTANT: Dorthula Matas, PA-C   DESCRIPTION OF PROCEDURE: The patient was brought to the operating room and after adequate anesthesia was obtained, the right leg was prepped and draped in the usual sterile fashion with a tourniquet applied to the upper thigh and the Alvarado legholder in place. After appropriate patient identification and time-out procedures were completed, the leg was exsanguinated with use of an Esmarch and the tourniquet raised to 300 mmHg. A midline skin incision was made followed by medial parapatellar arthrotomy. Inspection of the knee revealed extensive degenerative change particularly the medial compartment with eburnated bone and bone loss in the posterior medial aspect of the tibia and significant bone erosion of the patella and lateral femoral trochlear and facet. ACL was intact. The anterior horns of the menisci and ACL were excised and the proximal tibia exposed to allow for placement of the Medacta cutting block. With Medacta block in place, pins were placed and checking alignment appeared appropriate, appropriate resection. Proximal tibia cut was carried out with residual menisci excised. Next, the distal femur drill holes were made after making sure the cutting block was applied appropriately with distal femoral drill holes as well for the subsequent cutting block. The distal femoral cut was carried out and the #4 cutting block applied. It appeared to be the appropriate size and the anterior, posterior, and chamfer cuts carried out with no notching. The tibia trial was then inserted and placed in the appropriate rotation based on a pin left in  place with the cutting guide. The pins were used to hold it in place and the V-cut made to hold it for trialing. The #4 femoral component was impacted into place and that with the 10 mm insert there was good stability and full extension possible. The distal femoral drill holes were then made along with the notch and the trochlea with the appropriate guide. These trial components were removed at this point and the toe addressed first removing extensive spurs and then using the cutting guide to perform resection. The lateral facet of the patella was very sclerotic and drill holes were made and bone fixation. It was sized to a size 2 after drill holes were made. Bony surfaces were thoroughly irrigated and dried at this point. Bone cement was used with antibiotics and the tibial component was cemented into place first at the end of the polyethylene insert followed by the femoral component being impacted and excess cement removed. The knee was held in extension as the patellar button was clamped into place. Again, all components cemented. After the cement had set and the knee was thoroughly irrigated, 0.5% Sensorcaine 30 mL and 10 mg of morphine were infiltrated around the capsule to aid in postop analgesia. The wound was then closed with a heavy quill suture for the capsule, 2-0 quill subcutaneously, and skin staples. A Wound VAC was applied along with a knee immobilizer and Polar Care. The patient was then sent to the recovery room in stable condition. Tourniquet was let down after dressing was applied. Tourniquet time was 89 minutes at 300 mmHg.   ESTIMATED BLOOD LOSS: 50.   COMPLICATIONS: None.   IMPLANTS: Medacta GMK size 4 right standard  femoral component, size 3 right fixed tibial tray from the [place] primary system, a 10 mm size 3 standard tibial insert with a size 2 resurfacing patella with Simplex P with antibiotic bone cement.   SPECIMEN: Cut ends of bone.   ____________________________ Laurene Footman, MD mjm:drc D: 08/26/2011 19:26:40 ET T: 08/28/2011 10:36:55 ET JOB#: [id number]  cc: Laurene Footman, MD, <Dictator> Laurene Footman MD ELECTRONICALLY SIGNED 08/29/2011 12:29

## 2014-09-22 NOTE — Discharge Summary (Signed)
PATIENT NAME:  Susan Pratt, Susan Pratt MR#:  [id number] DATE OF BIRTH:  06/13/1940  DATE OF ADMISSION:  08/26/2011 DATE OF DISCHARGE:  08/30/2011  ADMITTING DIAGNOSIS: Status post right total knee arthroplasty for degenerative arthritis.   DISCHARGE DIAGNOSES:  1. Status post right total knee arthroplasty for degenerative arthritis.  2. Complaint of left knee, shoulder, and rib and back pain. The patient has chronic pain.   ATTENDING: Hessie Knows, M.D. ([place])   PROCEDURES: On 08/26/2011 the patient underwent right total knee replacement by Dr. Rudene Christians with assistant Dorthula Matas, PA-C.   ANESTHESIA: General.   ESTIMATED BLOOD LOSS: 50 mL.  TOURNIQUET TIME: 89 minutes.   SPECIMENS SENT: Cut ends of bone.   OPERATIVE FINDINGS: Severe degenerative joint disease with bone loss medially and patella eburnation.   IMPLANTS: Medacta My Knee.   DRAINS: Wound VAC drain was placed.  COMPLICATIONS: There were no complications encountered.  PATIENT HISTORY: Mrs. Kirksey is a 74 year old with chronic knee pain. She has had significant back and leg pain and underwent previous spinal fusion with resolution of lower back and leg pain but continued to have severe knee pain. It kept her from being an ambulator outside of her home for the last year and a half. She has been using a walker to get around her home and uses a wheelchair when traveling in the community. She has pain at rest and pain with activity. She had very short-term relief with steroid injection. She has failed exercise program as well as other nonoperative measures, including pain medication, anti-inflammatory medicine, and narcotic pain medication, she is now being admitted for knee replacement.   ALLERGIES AND ADVERSE REACTIONS: Salicylates which cause GI upset, also lisinopril.   PAST MEDICAL HISTORY:  1. Osteoarthritis. 2. Hypertension. 3. Non-insulin-dependent diabetes. 4. Peripheral neuropathy. 5. Reactive  airway disease.  6. Previous cholecystectomy. 7. Appendectomy.  8. Left foot hammertoe correction. 9. Hysterectomy for uterine cancer.   PHYSICAL EXAMINATION: GENERAL: Well-developed, well-nourished white female who appears her stated age in mild distress secondary to knee pain. HEENT: Remarkable for dentures. HEART: Regular rate and rhythm. LUNGS: Clear to auscultation. RIGHT KNEE: Positive varus deformity. Range of motion is 10 to 95 degrees with marked crepitation at the medial and patellofemoral joints. The varus deformity is passively correctable. There is moderate effusion with a Baker's cyst present. Distally she has trace dorsalis pedis pulse with some diminished sensation secondary to neuropathy.   RADIOLOGIC DATA: Prior x-rays revealed severe tricompartmental osteoarthritis and preoperative CT shows severe bone erosion in the medial compartment on both the femoral and tibial sides with extensive spurring and wear of the patellofemoral joint as well.   HOSPITAL COURSE: The patient underwent the aforementioned procedure on 26/83/4196 without complication and was transferred to the postanesthesia care unit and then the orthopedic floor in stable condition. She had quite a bit of pain initially postoperatively, but then she would complain that her left knee was hurting worse than her right. She does have a history of left knee arthritis. While she was in the hospital, I did inject her left knee with a mixture of about 4 mL bupivacaine and 1 mL of Depo-Medrol. A Band-Aid was applied and there was no significant complication. She underwent removal of her wound VAC by Dr. Rudene Christians on 08/30/2011 and he found her incision intact with staples and no sign of infection. Covaderm would be applied. The patient's Foley catheter was removed in good time. The patient really did have a stable hospital  course, but complained often of pain in multiple areas. The patient's hemoglobin was 11.8 on 08/26/2009 and no  transfusion was deemed necessary. Most of her blood sugars have been well under 200. A chest x-ray taken for rib pain was read as showing no definite acute cardiopulmonary disease. The patient was given magnesium citrate on 08/30/2011 in order to try to have a bowel movement. The patient worked with physical therapy on multiple occasions and worked on transfers, but really was not ambulating while here. She will need encouragement to do this. Active assisted range of motion of the surgical knee was 6 to 86 degrees, on 08/29/2011.   CONDITION AT DISCHARGE: Stable.   DISPOSITION: [facility].   DISCHARGE MEDICATIONS:  1. Dulcolax suppository 10 mg rectal daily as needed for constipation.  2. Milk of Magnesia 30 mL oral twice a day as needed for constipation.  3. Antacid DS suspension 30 mL oral every six hours as needed for heartburn.  4. Senokot-S 1 tablet oral twice a day. 5. Norvasc 10 mg oral daily.  6. Pravastatin 40 mg oral at bedtime. 7. Xarelto 10 mg daily, stop in 10 days from 08/30/2011 and then start aspirin 81 mg daily.  8. Tramadol 50 mg 1 to 2 every six hours as needed for pain.  9. Celebrex 200 mg oral twice a day. This may be stopped when leaving rehab, but she can go to taking her home meloxicam prescription. 10. Metformin 500 mg oral with breakfast.  11. Lyrica 300 mg oral twice a day. 12. Atenolol 50 mg oral daily. 13. Nystatin topical powder apply to affected area several times a day. 14. OxyContin ER 10 mg every 12 hours. 15. Oxycodone 5 to 10 mg every six hours as needed for pain. 16. One multivitamin daily.  17. Tylenol 500 to 1000 mg every six hours as needed for pain or fever.   DISCHARGE INSTRUCTIONS AND FOLLOW-UP: 1. Physical therapy and occupational therapy consult. She is weight-bearing as tolerated on her right knee. She may also need some exercises for her back.  2. 2000 calorie carbohydrate controlled diet.  3. She is to wear TED hose thigh-high daily for  four weeks status post surgery. These may be removed at night or for bathing.  4. Polar Care is to be applied to her right knee while sitting or lying down.  5. Right knee dressing changes as needed, in sterile fashion, as needed for drainage.  6. Soapsuds enema as needed. 7. Apply a K pad or heating pad to the low back as desired for pain.  8. Of note, the patient was on isolation contact while at [facility] because of the positive MRSA naris screen.  9. Universal skin precautions.  10. Please call [facility] to confirm a two week follow-up appointment for right knee staple removal or for any further questions please do call our office. ____________________________ Jerrel Ivory. [place], [place] jrp:slb D: 08/30/2011 09:23:34 ET T: 08/30/2011 09:35:06 ET JOB#: [id number]  cc: Jerrel Ivory. Charlett Nose, [place], <Dictator> [place] PA ELECTRONICALLY SIGNED 09/13/2011 12:52

## 2014-09-22 NOTE — H&P (Signed)
PATIENT NAME:  Susan Pratt, Susan Pratt MR#:  [id number] DATE OF BIRTH:  [birth year]  DATE OF ADMISSION:  08/26/2011  CHIEF COMPLAINT: Right knee pain.   HISTORY OF PRESENT ILLNESS: The patient is a 74 year old who has had chronic knee pain. She has had significant back and leg pain and underwent spinal fusion with resolution of most of her back and leg pain but continued to have severe knee pain.  It has kept her from being an ambulator outside of her home for the last year and a half. She has been using a walker to get around her home and uses a wheelchair when traveling in the community. She has pain at rest and pain with activity. She had very short-term relief with corticosteroid injection and unrelenting pain. She has failed exercise program as well as other nonoperative measures including pain medication, anti-inflammatory, narcotic pain medication, corticosteroid injection, and is now being admitted for knee replacement.   MEDICATIONS ON ADMISSION:   1. Amlodipine 10 mg daily.  2. Atenolol 50 mg daily.  3. Pravastatin 40 mg p.o. daily.  4. Tramadol 50 mg every six hours p.r.n.  5. Metformin 500 mg daily.  6. Aspirin 81 mg daily. 7. Lyrica 300 mg t.i.d.  8. Multivitamin daily.    9. Folic acid 1 mg daily.  10. Meloxicam 15 mg daily with meals.  ALLERGIES:  Salicylates- GI upset, drug reaction and not really true allergy.   PAST MEDICAL HISTORY:  1. Osteoarthritis.  2. Hypertension.  3. Non-insulin-dependent diabetes.  4. Peripheral neuropathy.  5. Reactive airway disease.   PAST SURGICAL HISTORY: 1. Lumbar spine surgery times two. 2. Cholecystectomy.  3. Appendectomy.  4. Left foot hammertoe correction.  5. Hysterectomy for uterine cancer.   SOCIAL HISTORY: The patient is married. She is a nondrinker, nonsmoker. She is disabled and now retired.   FAMILY HISTORY: Positive for diabetes in her parents and her father dying of a heart attack. She has siblings with diabetes and breast  cancer.  REVIEW OF SYSTEMS:  Negative except for her significant leg pain.   PHYSICAL EXAMINATION:  GENERAL: Well-developed, well-nourished white female who appears her stated age in mild distress secondary to knee pain.   HEENT: Remarkable for dentures.   NECK: Range of motion is normal.   LUNGS: Clear to auscultation.   HEART: Regular rate and rhythm.   ABDOMEN: Soft, nontender.   EXTREMITIES: Remarkable for the varus deformity to the right knee. Range of motion is 10 to 95 degrees with marked crepitation at the medial and patellofemoral joints. The varus deformity is mostly passively correctable. There is a moderate effusion with a Baker's cyst present. Distally she has trace dorsalis pedis pulse with some diminished sensation secondary to neuropathy.   Prior x-rays reveal severe tricompartmental osteoarthritis and preoperative CT shows severe erosion of bone in the medial compartment on both femoral and tibial sides with extensive spurring and wear at the patellofemoral joint as well.   CLINICAL IMPRESSION: Severe right knee osteoarthritis. Failure of nonoperative measures necessitates total knee replacement to try to regain community ambulator status.   PLAN: Right knee replacement today and probably will need rehab postoperatively because of her deconditioning related to being unable to ambulate much over the last year.    ____________________________ Laurene Footman, MD mjm:bjt D: 08/26/2011 06:19:18 ET T: 08/26/2011 06:57:37 ET JOB#: [id number]  cc: Laurene Footman, MD, <Dictator> Laurene Footman MD ELECTRONICALLY SIGNED 08/26/2011 7:18

## 2015-04-01 LAB — HM DIABETES EYE EXAM

## 2015-06-18 DIAGNOSIS — E7800 Pure hypercholesterolemia, unspecified: Secondary | ICD-10-CM | POA: Diagnosis not present

## 2015-06-18 DIAGNOSIS — R7302 Impaired glucose tolerance (oral): Secondary | ICD-10-CM | POA: Diagnosis not present

## 2015-06-18 DIAGNOSIS — E1340 Other specified diabetes mellitus with diabetic neuropathy, unspecified: Secondary | ICD-10-CM | POA: Diagnosis not present

## 2015-06-18 DIAGNOSIS — Z9181 History of falling: Secondary | ICD-10-CM | POA: Insufficient documentation

## 2015-06-18 DIAGNOSIS — I10 Essential (primary) hypertension: Secondary | ICD-10-CM | POA: Diagnosis not present

## 2015-06-18 LAB — HEMOGLOBIN A1C: Hemoglobin A1C: 5.9

## 2015-06-19 ENCOUNTER — Ambulatory Visit (INDEPENDENT_AMBULATORY_CARE_PROVIDER_SITE_OTHER): Payer: Medicare Other | Admitting: Family Medicine

## 2015-06-19 ENCOUNTER — Encounter: Payer: Self-pay | Admitting: Family Medicine

## 2015-06-19 VITALS — BP 128/78 | HR 76 | Resp 16 | Ht 68.0 in | Wt 257.0 lb

## 2015-06-19 DIAGNOSIS — E785 Hyperlipidemia, unspecified: Secondary | ICD-10-CM

## 2015-06-19 DIAGNOSIS — M1990 Unspecified osteoarthritis, unspecified site: Secondary | ICD-10-CM | POA: Insufficient documentation

## 2015-06-19 DIAGNOSIS — Z23 Encounter for immunization: Secondary | ICD-10-CM | POA: Diagnosis not present

## 2015-06-19 DIAGNOSIS — J9809 Other diseases of bronchus, not elsewhere classified: Secondary | ICD-10-CM | POA: Diagnosis not present

## 2015-06-19 DIAGNOSIS — E1142 Type 2 diabetes mellitus with diabetic polyneuropathy: Secondary | ICD-10-CM

## 2015-06-19 DIAGNOSIS — E538 Deficiency of other specified B group vitamins: Secondary | ICD-10-CM

## 2015-06-19 DIAGNOSIS — M069 Rheumatoid arthritis, unspecified: Secondary | ICD-10-CM | POA: Diagnosis not present

## 2015-06-19 DIAGNOSIS — M159 Polyosteoarthritis, unspecified: Secondary | ICD-10-CM

## 2015-06-19 DIAGNOSIS — E669 Obesity, unspecified: Secondary | ICD-10-CM

## 2015-06-19 DIAGNOSIS — E559 Vitamin D deficiency, unspecified: Secondary | ICD-10-CM

## 2015-06-19 DIAGNOSIS — I10 Essential (primary) hypertension: Secondary | ICD-10-CM | POA: Diagnosis not present

## 2015-06-19 DIAGNOSIS — Z8542 Personal history of malignant neoplasm of other parts of uterus: Secondary | ICD-10-CM | POA: Diagnosis not present

## 2015-06-19 DIAGNOSIS — N3946 Mixed incontinence: Secondary | ICD-10-CM | POA: Diagnosis not present

## 2015-06-19 NOTE — Patient Instructions (Signed)
Stop aspirin and Zanaflex. Increase gabapentin to 300 mg 2 tablets three times daily.

## 2015-06-20 DIAGNOSIS — J9809 Other diseases of bronchus, not elsewhere classified: Secondary | ICD-10-CM | POA: Insufficient documentation

## 2015-06-20 LAB — LIPID PANEL
CHOLESTEROL TOTAL: 182 mg/dL (ref 100–199)
Chol/HDL Ratio: 2.9 ratio units (ref 0.0–4.4)
HDL: 63 mg/dL (ref >39)
LDL Calculated: 93 mg/dL (ref 0–99)
Triglycerides: 128 mg/dL (ref 0–149)
VLDL Cholesterol Cal: 26 mg/dL (ref 5–40)

## 2015-06-20 LAB — COMPREHENSIVE METABOLIC PANEL
A/G RATIO: 1.7 (ref 1.1–2.5)
ALBUMIN: 4.4 g/dL (ref 3.5–4.8)
ALK PHOS: 70 IU/L (ref 39–117)
ALT: 13 IU/L (ref 0–32)
AST: 14 IU/L (ref 0–40)
BILIRUBIN TOTAL: 0.8 mg/dL (ref 0.0–1.2)
BUN / CREAT RATIO: 31 — AB (ref 11–26)
BUN: 27 mg/dL (ref 8–27)
CHLORIDE: 98 mmol/L (ref 96–106)
CO2: 25 mmol/L (ref 18–29)
Calcium: 10.4 mg/dL — ABNORMAL HIGH (ref 8.7–10.3)
Creatinine, Ser: 0.88 mg/dL (ref 0.57–1.00)
GFR calc Af Amer: 74 mL/min/{1.73_m2} (ref >59)
GFR calc non Af Amer: 64 mL/min/{1.73_m2} (ref >59)
GLUCOSE: 110 mg/dL — AB (ref 65–99)
Globulin, Total: 2.6 g/dL (ref 1.5–4.5)
POTASSIUM: 4.8 mmol/L (ref 3.5–5.2)
SODIUM: 140 mmol/L (ref 134–144)
Total Protein: 7 g/dL (ref 6.0–8.5)

## 2015-06-20 LAB — TSH: TSH: 1.73 u[IU]/mL (ref 0.450–4.500)

## 2015-06-20 LAB — VITAMIN B12: VITAMIN B 12: 1345 pg/mL — AB (ref 211–946)

## 2015-06-20 LAB — VITAMIN D 25 HYDROXY (VIT D DEFICIENCY, FRACTURES): Vit D, 25-Hydroxy: 22.9 ng/mL — ABNORMAL LOW (ref 30.0–100.0)

## 2015-06-20 NOTE — Progress Notes (Signed)
Date:  06/19/2015   Name:  Susan Pratt   DOB:  08-08-1940   MRN:  [id number]  PCP:  PROVIDER NOT IN SYSTEM    Chief Complaint: Establish Care   History of Present Illness:  This is a 75 y.o. female with MMP to establish primary care. Walked out of [facility] yesterday because they made her wait too long. Hx T2DM, last a1c 5.9% yesterday, told to stop metformin but still takes on occasion, checking BG's 3x/wk. HTN well controlled on amlodpine/atenolol. Weight improves when she avoid bread. RA/OA followed by Dr. Jefm Bryant on MTX and Plaquenil, takes tramadol 5-6/d to control pain, also on Mobic. S/p back surgery 2013 and uterine ca surgery 2012. Hx asthma well controlled on Symbicort bid and prn albuterol, can usually stop meds during summer, nonsmoker but lots of secondhand smoke in past. Gets CP at night but thinks radiating from L shoulder which has been injected in past. Takes gabapentin for diabetic neuropathy, marginal control, has not tried higher dose. Uses Nystatin/Kenalog bid to prevent yeast infections under breasts and pannus. Hx MRSA, none recently. Not taking pravachol, unsure if was helping. Takes oxybutynin XL for incontinence, bladder stimulator placed in past but doesn't help. Sees podiatrist for foot care, saw optho in Nov. Takes vit D and B12 supplements, on B12 shots in past. No flu imm this year, unsure of pneumo imms, tetanus imm 2012. Denies known cardiac or cerebrovascular dz.  Review of Systems:  Review of Systems  Constitutional: Negative for fever.  Respiratory: Negative for shortness of breath.   Gastrointestinal: Negative for abdominal pain, diarrhea and constipation.  Endocrine: Negative for polyuria.  Genitourinary: Negative for dysuria.  Skin: Negative for rash.  Neurological: Negative for syncope and light-headedness.    Patient Active Problem List   Diagnosis Date Noted  . Bronchospastic airway disease 06/20/2015  . Arthritis, degenerative 06/19/2015  .  Rheumatoid arthritis ([place]) 06/19/2015  . Obesity, Class II, BMI 35-39.9 06/19/2015  . At risk for falling 06/18/2015  . Calcification of bronchus or trachea 01/24/2014  . Rotator cuff syndrome 01/24/2014  . Backhand tennis elbow 01/24/2014  . 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 Hyperlipidemia 11/09/2012  . Acute cystitis 02/07/2012  . Incomplete bladder emptying 02/01/2012  . Mixed incontinence 02/01/2012  . Back ache 01/12/2011  . Diabetes mellitus ([place]) 01/12/2011  . Generalized OA 01/12/2011  . Hereditary and idiopathic neuropathy 01/12/2011  . BP (high blood pressure) 01/12/2011  . Cannot sleep 01/12/2011  . Diabetic peripheral neuropathy ([place]) 01/12/2011    Prior to Admission medications   Medication Sig Start Date End Date Taking? Authorizing Provider  albuterol (PROAIR HFA) 108 (90 Base) MCG/ACT inhaler Inhale into the lungs. 12/31/13  Yes Historical Provider, MD  amLODipine (NORVASC) 10 MG tablet Take by mouth. 04/15/11  Yes Historical Provider, MD  atenolol (TENORMIN) 50 MG tablet Take by mouth. 04/15/11  Yes Historical Provider, MD  budesonide-formoterol (SYMBICORT) 160-4.5 MCG/ACT inhaler  12/11/13  Yes Historical Provider, MD  folic acid (FOLVITE) 1 MG tablet Take by mouth. 10/14/14 10/14/15 Yes Historical Provider, MD  gabapentin (NEURONTIN) 300 MG capsule Take 2 capsules by mouth 3 (three) times daily. 10/10/13  Yes Historical Provider, MD  glucose blood (BAYER CONTOUR NEXT TEST) test strip  01/19/13  Yes Historical Provider, MD  hydroxychloroquine (PLAQUENIL) 200 MG tablet Take by mouth. 10/14/14  Yes Historical Provider, MD  meloxicam (MOBIC) 15 MG tablet TAKE 1 TABLEY BY MOUTH ONCE DAILY AS NEEDED FOR PAIN 10/10/13  Yes Historical Provider, MD  metFORMIN (GLUCOPHAGE) 500 MG tablet TAKE ONE (1) TABLET EACH DAY FOR SUGAR  CONTROL 06/01/12  Yes Historical Provider, MD  methotrexate (RHEUMATREX) 2.5 MG tablet Take by mouth. 03/25/15  Yes Historical Provider, MD   nystatin cream (MYCOSTATIN) Apply topically. 03/12/15 03/11/16 Yes Historical Provider, MD  oxybutynin (DITROPAN) 5 MG tablet Take 5 mg by mouth. 03/20/15  Yes Historical Provider, MD  traMADol (ULTRAM) 50 MG tablet 1-2 BY MOUTH EVERY 6 HOURS PRN PAIN. NO MORE THAN 8 PILLS PER DAY. 01/26/11  Yes Historical Provider, MD  triamcinolone cream (KENALOG) 0.1 % Apply topically. 03/12/15 03/11/16 Yes Historical Provider, MD    Allergies  Allergen Reactions  . Lisinopril Swelling    Causes tongue swelling    Past Surgical History  Procedure Laterality Date  . Knee surgery    . Eye surgery    . Abdominal hysterectomy      Social History  Substance Use Topics  . Smoking status: Never Smoker   . Smokeless tobacco: Never Used  . Alcohol Use: No    Family History  Problem Relation Age of Onset  . Cancer Mother   . Diabetes Mother   . Heart attack Father     Medication list has been reviewed and updated.  Physical Examination: BP 128/78 mmHg  Pulse 76  Resp 16  Ht 5\' 8"  (1.727 m)  Wt 257 lb (116.574 kg)  BMI 39.09 kg/m2  SpO2 97%  Physical Exam  Constitutional: She is oriented to person, place, and time. She appears well-developed and well-nourished.  HENT:  Head: Normocephalic and atraumatic.  Right Ear: External ear normal.  Left Ear: External ear normal.  Nose: Nose normal.  Mouth/Throat: Oropharynx is clear and moist.  TM's clear  Eyes: Conjunctivae and EOM are normal. Pupils are equal, round, and reactive to light. No scleral icterus.  Neck: Neck supple. No thyromegaly present.  Cardiovascular: Normal rate, regular rhythm and normal heart sounds.   Pulmonary/Chest: Effort normal and breath sounds normal.  Abdominal: Soft. She exhibits no distension and no mass. There is no tenderness.  Musculoskeletal:  1+ BLE edema  Lymphadenopathy:    She has no cervical adenopathy.  Neurological: She is alert and oriented to person, place, and time. Coordination normal.   Skin: Skin is warm and dry.  Psychiatric: She has a normal mood and affect. Her behavior is normal.  Nursing note and vitals reviewed.   Assessment and Plan:  1. Type 2 diabetes mellitus with diabetic polyneuropathy, without long-term current use of insulin (HCC) Well controlled, agree with d/c metformin  2. Rheumatoid arthritis, involving unspecified site, unspecified rheumatoid factor presence ([place]) Followed by Dr. Jefm Bryant, cont MTX/Plaquenil/Mobic/tramadol per his office, d/c Zanaflex  3. Essential hypertension Well controlled, cont atenolo/amlodipine, d/c asa given no established CV dz - Comprehensive Metabolic Panel (CMET)  4. Vitamin D deficiency On supplementation - Vitamin D (25 hydroxy)  5. B12 deficiency On supplementation - B12  6. Hyperlipidemia Off Pravachol - Lipid Profile  7. Generalized OA Followed by Dr. Jefm Bryant  8. Diabetic peripheral neuropathy (HCC) Marginal control, increase gabapentin to 600 mg tid  9. Mixed incontinence Followed [facility] urology, cont oxybutynin XL  10. Bronchospastic airway disease Asthma vs. COPD unclear but well controlled, cont Symbicort/prn albuterol  11. Obesity, Class II, BMI 35-39.9 - TSH  12. Hx of cancer of endometrium  13. Need for influenza vaccination - Flu Vaccine QUAD 36+ mos PF IM (Fluarix & Fluzone Quad PF)  14.  Need for pneumococcal vaccination - Pneumococcal conjugate vaccine 13-valent  Return in about 4 weeks (around 07/17/2015). 45 minutes spent with patient, over half of time in counseling.  Satira Anis. Burt Ek MD [facility]  06/20/2015

## 2015-06-23 DIAGNOSIS — L03032 Cellulitis of left toe: Secondary | ICD-10-CM | POA: Diagnosis not present

## 2015-06-23 DIAGNOSIS — L02612 Cutaneous abscess of left foot: Secondary | ICD-10-CM | POA: Diagnosis not present

## 2015-07-04 ENCOUNTER — Ambulatory Visit (INDEPENDENT_AMBULATORY_CARE_PROVIDER_SITE_OTHER): Payer: Medicare Other | Admitting: Family Medicine

## 2015-07-04 ENCOUNTER — Encounter: Payer: Self-pay | Admitting: Family Medicine

## 2015-07-04 VITALS — BP 143/84 | HR 96 | Resp 16 | Ht 69.0 in | Wt 262.2 lb

## 2015-07-04 DIAGNOSIS — I10 Essential (primary) hypertension: Secondary | ICD-10-CM

## 2015-07-04 DIAGNOSIS — E559 Vitamin D deficiency, unspecified: Secondary | ICD-10-CM | POA: Diagnosis not present

## 2015-07-04 DIAGNOSIS — N3946 Mixed incontinence: Secondary | ICD-10-CM | POA: Diagnosis not present

## 2015-07-04 DIAGNOSIS — M069 Rheumatoid arthritis, unspecified: Secondary | ICD-10-CM | POA: Diagnosis not present

## 2015-07-04 DIAGNOSIS — E538 Deficiency of other specified B group vitamins: Secondary | ICD-10-CM | POA: Insufficient documentation

## 2015-07-04 DIAGNOSIS — J9809 Other diseases of bronchus, not elsewhere classified: Secondary | ICD-10-CM | POA: Diagnosis not present

## 2015-07-04 DIAGNOSIS — E1142 Type 2 diabetes mellitus with diabetic polyneuropathy: Secondary | ICD-10-CM | POA: Diagnosis not present

## 2015-07-04 DIAGNOSIS — E669 Obesity, unspecified: Secondary | ICD-10-CM

## 2015-07-04 DIAGNOSIS — E66812 Obesity, class 2: Secondary | ICD-10-CM

## 2015-07-04 DIAGNOSIS — Z86711 Personal history of pulmonary embolism: Secondary | ICD-10-CM | POA: Diagnosis not present

## 2015-07-04 NOTE — Progress Notes (Signed)
Date:  07/04/2015   Name:  Susan Pratt   DOB:  12/17/1940   MRN:  [id number]  PCP:  PROVIDER NOT IN SYSTEM    Chief Complaint: Arthritis   History of Present Illness:  This is a 75 y.o. female followed by Dr. Jefm Bryant for RA/OA on MTX/Mobic/Plaquenil/tramadol, has appt with him in two weeks. Requesting tramadol refill here today. Blood work last visit ok except for vit D def, was not taking vit D supp but is now, unsure dose. Weight up 5# since last month but says wearing heavier shoes. Neuropathic pain improved on increased gabapentin, a1c 5.9 last month, off metformin. Reports hx PE two years ago she didn't mention last visit so stayed on asa. Occ BLE edema improves at night. Urinary and respiratory sxs about the same. No new concerns.  Review of Systems:  Review of Systems  Respiratory: Negative for shortness of breath.   Cardiovascular: Negative for chest pain.  Endocrine: Negative for polyuria.  Genitourinary: Negative for difficulty urinating.  Neurological: Negative for syncope and light-headedness.    Patient Active Problem List   Diagnosis Date Noted  . Vitamin D deficiency 07/04/2015  . Vitamin B12 deficiency 07/04/2015  . Bronchospastic airway disease 06/20/2015  . Rheumatoid arthritis ([place]) 06/19/2015  . Obesity, Class II, BMI 35-39.9 06/19/2015  . At risk for falling 06/18/2015  . Calcification of bronchus or trachea 01/24/2014  . Hx of cancer of endometrium 11/09/2012  . Hyperlipidemia 11/09/2012  . Incomplete bladder emptying 02/01/2012  . Mixed incontinence 02/01/2012  . Diabetes mellitus ([place]) 01/12/2011  . Generalized OA 01/12/2011  . BP (high blood pressure) 01/12/2011  . Diabetic peripheral neuropathy ([place]) 01/12/2011    Prior to Admission medications   Medication Sig Start Date End Date Taking? Authorizing Provider  albuterol (PROAIR HFA) 108 (90 Base) MCG/ACT inhaler Inhale into the lungs. 12/31/13  Yes Historical Provider, MD  amLODipine (NORVASC) 10  MG tablet Take by mouth. 04/15/11  Yes Historical Provider, MD  atenolol (TENORMIN) 50 MG tablet Take by mouth. 04/15/11  Yes Historical Provider, MD  budesonide-formoterol (SYMBICORT) 160-4.5 MCG/ACT inhaler  12/11/13  Yes Historical Provider, MD  cholecalciferol (VITAMIN D) 1000 units tablet Take 1,000 Units by mouth daily.   Yes Historical Provider, MD  folic acid (FOLVITE) 1 MG tablet Take by mouth. 10/14/14 10/14/15 Yes Historical Provider, MD  gabapentin (NEURONTIN) 300 MG capsule Take 2 capsules by mouth 3 (three) times daily. 10/10/13  Yes Historical Provider, MD  glucose blood (BAYER CONTOUR NEXT TEST) test strip  01/19/13  Yes Historical Provider, MD  hydroxychloroquine (PLAQUENIL) 200 MG tablet Take 1 tablet by mouth 2 (two) times daily. 10/14/14  Yes Historical Provider, MD  meloxicam (MOBIC) 15 MG tablet TAKE 1 TABLEY BY MOUTH ONCE DAILY AS NEEDED FOR PAIN 10/10/13  Yes Historical Provider, MD  methotrexate (RHEUMATREX) 2.5 MG tablet Take 6 tablets by mouth once a week. 03/25/15  Yes Historical Provider, MD  Multiple Vitamin (MULTIVITAMIN) capsule Take 1 capsule by mouth daily.   Yes Historical Provider, MD  nystatin cream (MYCOSTATIN) Apply topically. 03/12/15 03/11/16 Yes Historical Provider, MD  oxybutynin (DITROPAN) 5 MG tablet Take 5 mg by mouth. 03/20/15  Yes Historical Provider, MD  triamcinolone cream (KENALOG) 0.1 % Apply topically. 03/12/15 03/11/16 Yes Historical Provider, MD  vitamin B-12 (CYANOCOBALAMIN) 1000 MCG tablet Take 1,000 mcg by mouth daily.   Yes Historical Provider, MD  traMADol Veatrice Bourbon) 50 MG tablet Reported on 07/04/2015 01/26/11   Historical Provider, MD  Allergies  Allergen Reactions  . Lisinopril Swelling    Causes tongue swelling    Past Surgical History  Procedure Laterality Date  . Knee surgery    . Eye surgery    . Abdominal hysterectomy      Social History  Substance Use Topics  . Smoking status: Never Smoker   . Smokeless tobacco: Never Used   . Alcohol Use: No    Family History  Problem Relation Age of Onset  . Cancer Mother   . Diabetes Mother   . Heart attack Father     Medication list has been reviewed and updated.  Physical Examination: BP 143/84 mmHg  Pulse 96  Resp 16  Ht 5\' 9"  (1.753 m)  Wt 262 lb 3.2 oz (118.933 kg)  BMI 38.70 kg/m2  SpO2 95%  Physical Exam  Constitutional: She appears well-developed and well-nourished.  Cardiovascular: Normal rate, regular rhythm and normal heart sounds.   Pulmonary/Chest: Effort normal and breath sounds normal.  Musculoskeletal:  Trace BLE edema  Neurological: She is alert.  Skin: Skin is warm and dry.  Psychiatric: She has a normal mood and affect. Her behavior is normal.  Nursing note and vitals reviewed.   Assessment and Plan:  1. Essential hypertension Marginal control today, well controlled last visit, cont atenolol/amlodipine  2. Bronchospastic airway disease Stable, cont Symbicort/albuterol  3. Type 2 diabetes mellitus with diabetic polyneuropathy, without long-term current use of insulin (HCC) Well controlled, neuropathy sxs improved, monitor off metformin, cont gabapentin  4. Rheumatoid arthritis, involving unspecified site, unspecified rheumatoid factor presence ([place]) Followed by Dr. Jefm Bryant, advised pt to contact his office for tramadol refill  5. Obesity, Class II, BMI 35-39.9 Weight loss discussed  6. Mixed incontinence Stable, followed by [facility] urology, cont Ditropan  7. Vitamin D deficiency On supplement  8. Vitamin B12 deficiency On supplement, consider d/c folate  9. Hx PE per pt Cont asa  Return in about 3 months (around 10/01/2015).  Satira Anis. [facility]  07/04/2015

## 2015-07-16 DIAGNOSIS — M0579 Rheumatoid arthritis with rheumatoid factor of multiple sites without organ or systems involvement: Secondary | ICD-10-CM | POA: Diagnosis not present

## 2015-07-17 ENCOUNTER — Ambulatory Visit: Payer: Medicare Other | Admitting: Family Medicine

## 2015-07-18 ENCOUNTER — Ambulatory Visit: Payer: Medicare Other | Admitting: Family Medicine

## 2015-07-23 DIAGNOSIS — G8929 Other chronic pain: Secondary | ICD-10-CM | POA: Diagnosis not present

## 2015-07-23 DIAGNOSIS — M25512 Pain in left shoulder: Secondary | ICD-10-CM | POA: Diagnosis not present

## 2015-07-23 DIAGNOSIS — M0579 Rheumatoid arthritis with rheumatoid factor of multiple sites without organ or systems involvement: Secondary | ICD-10-CM | POA: Diagnosis not present

## 2015-07-23 DIAGNOSIS — M542 Cervicalgia: Secondary | ICD-10-CM | POA: Diagnosis not present

## 2015-07-23 DIAGNOSIS — M150 Primary generalized (osteo)arthritis: Secondary | ICD-10-CM | POA: Diagnosis not present

## 2015-08-11 DIAGNOSIS — M1712 Unilateral primary osteoarthritis, left knee: Secondary | ICD-10-CM | POA: Diagnosis not present

## 2015-08-11 DIAGNOSIS — M25562 Pain in left knee: Secondary | ICD-10-CM | POA: Diagnosis not present

## 2015-08-14 ENCOUNTER — Other Ambulatory Visit: Payer: Self-pay | Admitting: Orthopedic Surgery

## 2015-08-14 DIAGNOSIS — M1712 Unilateral primary osteoarthritis, left knee: Secondary | ICD-10-CM

## 2015-08-14 DIAGNOSIS — M19012 Primary osteoarthritis, left shoulder: Secondary | ICD-10-CM

## 2015-08-22 DIAGNOSIS — J9801 Acute bronchospasm: Secondary | ICD-10-CM | POA: Insufficient documentation

## 2015-09-02 ENCOUNTER — Ambulatory Visit: Admission: RE | Admit: 2015-09-02 | Payer: Medicare Other | Source: Ambulatory Visit

## 2015-09-03 DIAGNOSIS — M150 Primary generalized (osteo)arthritis: Secondary | ICD-10-CM | POA: Diagnosis not present

## 2015-09-03 DIAGNOSIS — M25512 Pain in left shoulder: Secondary | ICD-10-CM | POA: Diagnosis not present

## 2015-09-03 DIAGNOSIS — Z79899 Other long term (current) drug therapy: Secondary | ICD-10-CM | POA: Diagnosis not present

## 2015-09-03 DIAGNOSIS — L723 Sebaceous cyst: Secondary | ICD-10-CM | POA: Diagnosis not present

## 2015-09-03 DIAGNOSIS — M0579 Rheumatoid arthritis with rheumatoid factor of multiple sites without organ or systems involvement: Secondary | ICD-10-CM | POA: Diagnosis not present

## 2015-09-04 ENCOUNTER — Other Ambulatory Visit: Payer: Self-pay | Admitting: Orthopedic Surgery

## 2015-09-04 DIAGNOSIS — M25512 Pain in left shoulder: Secondary | ICD-10-CM

## 2015-09-11 DIAGNOSIS — M795 Residual foreign body in soft tissue: Secondary | ICD-10-CM | POA: Diagnosis not present

## 2015-09-11 DIAGNOSIS — N3941 Urge incontinence: Secondary | ICD-10-CM | POA: Diagnosis not present

## 2015-09-11 DIAGNOSIS — L304 Erythema intertrigo: Secondary | ICD-10-CM | POA: Diagnosis not present

## 2015-09-18 ENCOUNTER — Ambulatory Visit
Admission: RE | Admit: 2015-09-18 | Discharge: 2015-09-18 | Disposition: A | Discharge status: Home / Self Care | Payer: Medicare Other | Source: Ambulatory Visit | Attending: Orthopedic Surgery | Admitting: Orthopedic Surgery

## 2015-09-18 DIAGNOSIS — M25512 Pain in left shoulder: Secondary | ICD-10-CM | POA: Diagnosis not present

## 2015-09-18 DIAGNOSIS — M75102 Unspecified rotator cuff tear or rupture of left shoulder, not specified as traumatic: Secondary | ICD-10-CM | POA: Diagnosis not present

## 2015-09-18 DIAGNOSIS — M65812 Other synovitis and tenosynovitis, left shoulder: Secondary | ICD-10-CM | POA: Insufficient documentation

## 2015-09-18 DIAGNOSIS — M25412 Effusion, left shoulder: Secondary | ICD-10-CM | POA: Diagnosis not present

## 2015-10-02 ENCOUNTER — Ambulatory Visit: Payer: Medicare Other | Admitting: Family Medicine

## 2015-10-08 ENCOUNTER — Telehealth: Payer: Self-pay

## 2015-10-08 MED ORDER — AMLODIPINE BESYLATE 10 MG PO TABS: 10.0000 mg | ORAL_TABLET | Freq: Every day | ORAL | Status: AC

## 2015-10-08 NOTE — Telephone Encounter (Signed)
Sent to [place] 

## 2015-10-08 NOTE — Addendum Note (Signed)
Addended by: Adline Potter on: 10/08/2015 11:41 AM   Modules accepted: Orders

## 2015-10-10 DIAGNOSIS — M19019 Primary osteoarthritis, unspecified shoulder: Secondary | ICD-10-CM | POA: Insufficient documentation

## 2015-10-10 DIAGNOSIS — M12812 Other specific arthropathies, not elsewhere classified, left shoulder: Secondary | ICD-10-CM | POA: Diagnosis not present

## 2015-10-10 DIAGNOSIS — M19012 Primary osteoarthritis, left shoulder: Secondary | ICD-10-CM | POA: Diagnosis not present

## 2015-10-10 DIAGNOSIS — M12819 Other specific arthropathies, not elsewhere classified, unspecified shoulder: Secondary | ICD-10-CM | POA: Insufficient documentation

## 2015-10-16 ENCOUNTER — Encounter
Admission: RE | Admit: 2015-10-16 | Discharge: 2015-10-16 | Disposition: A | Discharge status: Home / Self Care | Payer: Medicare Other | Source: Ambulatory Visit | Attending: Surgery | Admitting: Surgery

## 2015-10-16 DIAGNOSIS — I10 Essential (primary) hypertension: Secondary | ICD-10-CM | POA: Diagnosis not present

## 2015-10-16 DIAGNOSIS — Z01810 Encounter for preprocedural cardiovascular examination: Secondary | ICD-10-CM | POA: Insufficient documentation

## 2015-10-16 DIAGNOSIS — Z01812 Encounter for preprocedural laboratory examination: Secondary | ICD-10-CM | POA: Diagnosis not present

## 2015-10-16 HISTORY — DX: Unspecified abnormalities of gait and mobility: R26.9

## 2015-10-16 HISTORY — DX: Polyneuropathy, unspecified: G62.9

## 2015-10-16 HISTORY — DX: Malignant (primary) neoplasm, unspecified: C80.1

## 2015-10-16 HISTORY — DX: Unspecified urinary incontinence: R32

## 2015-10-16 LAB — CBC
HEMATOCRIT: 45 % (ref 35.0–47.0)
HEMOGLOBIN: 14.8 g/dL (ref 12.0–16.0)
MCH: 29.6 pg (ref 26.0–34.0)
MCHC: 32.9 g/dL (ref 32.0–36.0)
MCV: 89.8 fL (ref 80.0–100.0)
Platelets: 371 10*3/uL (ref 150–440)
RBC: 5.01 MIL/uL (ref 3.80–5.20)
RDW: 13.8 % (ref 11.5–14.5)
WBC: 10.4 10*3/uL (ref 3.6–11.0)

## 2015-10-16 LAB — URINALYSIS COMPLETE WITH MICROSCOPIC (ARMC ONLY)
BILIRUBIN URINE: NEGATIVE
Glucose, UA: NEGATIVE mg/dL
HGB URINE DIPSTICK: NEGATIVE
Ketones, ur: NEGATIVE mg/dL
Nitrite: POSITIVE — AB
PH: 5 (ref 5.0–8.0)
Protein, ur: NEGATIVE mg/dL
Specific Gravity, Urine: 1.017 (ref 1.005–1.030)

## 2015-10-16 LAB — BASIC METABOLIC PANEL
Anion gap: 8 (ref 5–15)
BUN: 19 mg/dL (ref 6–20)
CALCIUM: 9.4 mg/dL (ref 8.9–10.3)
CO2: 28 mmol/L (ref 22–32)
CREATININE: 0.97 mg/dL (ref 0.44–1.00)
Chloride: 106 mmol/L (ref 101–111)
GFR calc Af Amer: >60 mL/min (ref >60)
GFR, EST NON AFRICAN AMERICAN: 55 mL/min — AB (ref >60)
GLUCOSE: 118 mg/dL — AB (ref 65–99)
POTASSIUM: 4.3 mmol/L (ref 3.5–5.1)
Sodium: 142 mmol/L (ref 135–145)

## 2015-10-16 LAB — MRSA PCR SCREENING: MRSA by PCR: NEGATIVE

## 2015-10-16 NOTE — Pre-Procedure Instructions (Addendum)
REQUEST FOR MEDICAL CLEARANCE/EKG AS INSTRUCTED BY DR Kayleen Memos CALLED AND FAXED TO DR Reeves.  SPOKE  WITH LINDA AT SURGEONS OFFICE AND LANDON AT DR [facility]. FAXED UA RESULTS TO DR Roland Rack

## 2015-10-16 NOTE — Patient Instructions (Signed)
  Your procedure is scheduled on:10/23/15 Report to Day Surgery. [facility] SECOND FLOOR To find out your arrival time please call [phone] between 1PM - 3PM on  10/22/15  Remember: Instructions that are not followed completely may result in serious medical risk, up to and including death, or upon the discretion of your surgeon and anesthesiologist your surgery may need to be rescheduled.    __X__ 1. Do not eat food or drink liquids after midnight. No gum chewing or hard candies.     __X__ 2. No Alcohol for 24 hours before or after surgery.   ____ 3. Bring all medications with you on the day of surgery if instructed.    _X___ 4. Notify your doctor if there is any change in your medical condition     (cold, fever, infections).     Do not wear jewelry, make-up, hairpins, clips or nail polish.  Do not wear lotions, powders, or perfumes. You may wear deodorant.  Do not shave 48 hours prior to surgery. Men may shave face and neck.  Do not bring valuables to the hospital.    [facility] is not responsible for any belongings or valuables.               Contacts, dentures or bridgework may not be worn into surgery.  Leave your suitcase in the car. After surgery it may be brought to your room.  For patients admitted to the hospital, discharge time is determined by your                treatment team.   Patients discharged the day of surgery will not be allowed to drive home.   Please read over the following fact sheets that you were given:   Surgical Site Infection Prevention/MRSA   __X__ Take these medicines the morning of surgery with A SIP OF WATER:    1.ATENOLOL  2. AMLODIPINE  3. GABAPENTIN  4.  5.  6.  ____ Fleet Enema (as directed)   _X___ Use CHG Soap as directed  _X___ Use inhalers on the day of surgery  __X__ Stop metformin 2 days prior to surgery    ____ Take 1/2 of usual insulin dose the night before surgery and none on the morning of surgery.   ____ Stop  Coumadin/Plavix/aspirin on         ALREADY STOPPED ASPIRIN AND MELOXICAM ____ Stop Anti-inflammatories on     ____ Stop supplements until after surgery.    ____ Bring C-Pap to the hospital.

## 2015-10-17 ENCOUNTER — Encounter: Payer: Self-pay | Admitting: Family Medicine

## 2015-10-17 ENCOUNTER — Ambulatory Visit (INDEPENDENT_AMBULATORY_CARE_PROVIDER_SITE_OTHER): Payer: Medicare Other | Admitting: Family Medicine

## 2015-10-17 VITALS — BP 128/76 | HR 77 | Temp 98.1°F | Resp 16 | Ht 69.0 in | Wt 251.0 lb

## 2015-10-17 DIAGNOSIS — E538 Deficiency of other specified B group vitamins: Secondary | ICD-10-CM | POA: Diagnosis not present

## 2015-10-17 DIAGNOSIS — R339 Retention of urine, unspecified: Secondary | ICD-10-CM | POA: Diagnosis not present

## 2015-10-17 DIAGNOSIS — I10 Essential (primary) hypertension: Secondary | ICD-10-CM | POA: Diagnosis not present

## 2015-10-17 DIAGNOSIS — Z86711 Personal history of pulmonary embolism: Secondary | ICD-10-CM | POA: Diagnosis not present

## 2015-10-17 DIAGNOSIS — E669 Obesity, unspecified: Secondary | ICD-10-CM | POA: Diagnosis not present

## 2015-10-17 DIAGNOSIS — E559 Vitamin D deficiency, unspecified: Secondary | ICD-10-CM | POA: Diagnosis not present

## 2015-10-17 DIAGNOSIS — Z01818 Encounter for other preprocedural examination: Secondary | ICD-10-CM | POA: Diagnosis not present

## 2015-10-17 DIAGNOSIS — N3000 Acute cystitis without hematuria: Secondary | ICD-10-CM

## 2015-10-17 DIAGNOSIS — E66812 Obesity, class 2: Secondary | ICD-10-CM

## 2015-10-17 DIAGNOSIS — J9809 Other diseases of bronchus, not elsewhere classified: Secondary | ICD-10-CM

## 2015-10-17 MED ORDER — SULFAMETHOXAZOLE-TRIMETHOPRIM 800-160 MG PO TABS: 1.0000 | ORAL_TABLET | Freq: Two times a day (BID) | ORAL | Status: DC

## 2015-10-17 NOTE — Progress Notes (Signed)
Date:  10/17/2015   Name:  Susan Pratt   DOB:  1940-10-02   MRN:  [id number]  PCP:  Adline Potter, MD    Chief Complaint: Medical Clearance   History of Present Illness:  This is a 75 y.o. female who is scheduled for shoulder replacement next week presents for medical clearance. Her T2DM is stable with a1c of 5.9% in Jan on daily metformin. Her asthma is well controlled on Symbicort and prn albuterol and HTN well controlled on amlodipine and atenolol. RA followed by Dr. Jefm Bryant and urinary retention by [facility] urology on bladder stimulator and Ditropan. Takes vit D and B12 supplements. Hx PE 2015 on chronic asa which she is holding for surgery. Blood work yesterday unremarkable but UA showed pos nitrite and 2+ leuk, cx pending.  Review of Systems:  Review of Systems  Constitutional: Negative for fever and fatigue.  Respiratory: Negative for cough and shortness of breath.   Cardiovascular: Negative for chest pain and leg swelling.  Endocrine: Negative for polyuria.  Neurological: Negative for syncope and light-headedness.    Patient Active Problem List   Diagnosis Date Noted  . Arthritis of shoulder region, degenerative 10/10/2015  . Rotator cuff arthropathy 10/10/2015  . Acute bronchospasm 08/22/2015  . Vitamin D deficiency 07/04/2015  . Vitamin B12 deficiency 07/04/2015  . Hx pulmonary embolism 07/04/2015  . Bronchospastic airway disease 06/20/2015  . Rheumatoid arthritis ([place]) 06/19/2015  . Obesity, Class II, BMI 35-39.9 06/19/2015  . At risk for falling 06/18/2015  . Calcification of bronchus or trachea 01/24/2014  . Hx of cancer of endometrium 11/09/2012  . Hyperlipidemia 11/09/2012  . Incomplete bladder emptying 02/01/2012  . Mixed incontinence 02/01/2012  . Type 2 diabetes, controlled, with neuropathy ([place]) 01/12/2011  . Generalized OA 01/12/2011  . Hypertension 01/12/2011  . Back ache 01/12/2011  . 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 Authorizing Provider  albuterol (PROAIR HFA) 108 (90 Base) MCG/ACT inhaler Inhale into the lungs. 12/31/13  Yes Historical Provider, MD  amLODipine (NORVASC) 10 MG tablet Take 1 tablet (10 mg total) by mouth daily. 10/08/15  Yes Adline Potter, MD  aspirin EC 81 MG tablet Take 81 mg by mouth daily.   Yes Historical Provider, MD  atenolol (TENORMIN) 50 MG tablet Take by mouth. 04/15/11  Yes Historical Provider, MD  cholecalciferol (VITAMIN D) 1000 units tablet Take 1,000 Units by mouth daily.   Yes Historical Provider, MD  glucose blood (BAYER CONTOUR NEXT TEST) test strip  01/19/13  Yes Historical Provider, MD  meloxicam (MOBIC) 15 MG tablet TAKE 1 TABLEY BY MOUTH ONCE DAILY AS NEEDED FOR PAIN 10/10/13  Yes Historical Provider, MD  metFORMIN (GLUCOPHAGE) 500 MG tablet Take by mouth daily with breakfast. If gluose more than 130 takes 0.5 pill   Yes Historical Provider, MD  Multiple Vitamin (MULTIVITAMIN) capsule Take 1 capsule by mouth daily.   Yes Historical Provider, MD  oxybutynin (DITROPAN) 5 MG tablet Take 5 mg by mouth. 03/20/15  Yes Historical Provider, MD  traMADol Veatrice Bourbon) 50 MG tablet Reported on 07/04/2015 01/26/11  Yes Historical Provider, MD  triamcinolone cream (KENALOG) 0.1 % Apply topically. 03/12/15 03/11/16 Yes Historical Provider, MD  vitamin B-12 (CYANOCOBALAMIN) 1000 MCG tablet Take 1,000 mcg by mouth daily.   Yes Historical Provider, MD  gabapentin (NEURONTIN) 300 MG capsule Take 2 capsules by mouth 3 (three) times daily. 10/10/13   Historical Provider, MD  nystatin cream (MYCOSTATIN) Apply topically. 03/12/15 03/11/16  Historical Provider, MD  sulfamethoxazole-trimethoprim (BACTRIM DS,SEPTRA DS) 800-160 MG tablet Take 1 tablet by mouth 2 (two) times daily. 10/17/15   Adline Potter, MD    Allergies  Allergen Reactions  . Lisinopril Swelling    Causes tongue swelling    Past Surgical History  Procedure Laterality Date  .  Knee surgery    . Eye surgery    . Abdominal hysterectomy    . Appendectomy    . Cholecystectomy    . Joint replacement    . Back surgery      2010  . Bladder surgery      STIMULATOR    Social History  Substance Use Topics  . Smoking status: Never Smoker   . Smokeless tobacco: Never Used  . Alcohol Use: No    Family History  Problem Relation Age of Onset  . Cancer Mother   . Diabetes Mother   . Heart attack Father     Medication list has been reviewed and updated.  Physical Examination: BP 128/76 mmHg  Pulse 77  Temp(Src) 98.1 F (36.7 C) (Oral)  Resp 16  Ht 5\' 9"  (1.753 m)  Wt 251 lb (113.853 kg)  BMI 37.05 kg/m2  SpO2 95%  Physical Exam  Constitutional: She appears well-developed and well-nourished.  Cardiovascular: Normal rate, regular rhythm and normal heart sounds.   Pulmonary/Chest: Effort normal and breath sounds normal.  Musculoskeletal: She exhibits no edema.  Neurological: She is alert.  Skin: Skin is warm and dry.  Psychiatric: She has a normal mood and affect. Her behavior is normal.  Nursing note and vitals reviewed.   Assessment and Plan:  1. Pre-op evaluation EKG unreadable due to bladder stimulator interference but chronic problems appear stable placing pt a moderate risk. With hx PE would consider anticoagulation during hospital stay. - EKG 12-Lead  2. Acute cystitis without hematuria Bactrim DS x 3d to clear prior to surgery, cx pending  3. Essential hypertension Well controlled on atenolol and amlodipine  4. Bronchospastic airway disease Stable on Symbicort/albuterol  5. Incomplete bladder emptying Bladder stim in place, followed by [facility] urology  6. Obesity, Class II, BMI 35-39.9 Weight down 11# from last visit  7. Vitamin D deficiency On supplement  8. Vitamin B12 deficiency On supplement  9. Hx pulmonary embolism Cont asa long term  Return in about 4 weeks (around 11/14/2015).  Satira Anis. [facility]  10/17/2015

## 2015-10-18 LAB — URINE CULTURE: Culture: >=100000 — AB

## 2015-10-20 ENCOUNTER — Encounter: Payer: Self-pay | Admitting: *Deleted

## 2015-10-20 NOTE — Pre-Procedure Instructions (Addendum)
CLEARED MODERATE RISK BY DR Vicente Masson 10/17/15. URINE CULTURE FAXED TO DR POGGI. OR NOTIFIED BLADDER STIMULATOR

## 2015-10-22 ENCOUNTER — Other Ambulatory Visit: Payer: Self-pay | Admitting: Family Medicine

## 2015-10-23 ENCOUNTER — Telehealth: Payer: Self-pay

## 2015-10-23 ENCOUNTER — Inpatient Hospital Stay: Payer: Medicare Other | Admitting: Anesthesiology

## 2015-10-23 ENCOUNTER — Encounter: Payer: Self-pay | Admitting: *Deleted

## 2015-10-23 ENCOUNTER — Encounter
Admission: RE | Disposition: A | Discharge status: Home Health | Payer: Self-pay | Source: Ambulatory Visit | Attending: Surgery

## 2015-10-23 ENCOUNTER — Inpatient Hospital Stay
Admission: RE | Admit: 2015-10-23 | Discharge: 2015-10-24 | DRG: 483 | Disposition: A | Discharge status: Home Health | Payer: Medicare Other | Source: Ambulatory Visit | Attending: Surgery | Admitting: Surgery

## 2015-10-23 ENCOUNTER — Inpatient Hospital Stay: Payer: Medicare Other

## 2015-10-23 DIAGNOSIS — M6281 Muscle weakness (generalized): Secondary | ICD-10-CM | POA: Diagnosis not present

## 2015-10-23 DIAGNOSIS — M25512 Pain in left shoulder: Secondary | ICD-10-CM | POA: Diagnosis not present

## 2015-10-23 DIAGNOSIS — Z96612 Presence of left artificial shoulder joint: Secondary | ICD-10-CM

## 2015-10-23 DIAGNOSIS — E785 Hyperlipidemia, unspecified: Secondary | ICD-10-CM | POA: Diagnosis present

## 2015-10-23 DIAGNOSIS — H269 Unspecified cataract: Secondary | ICD-10-CM | POA: Diagnosis present

## 2015-10-23 DIAGNOSIS — Z471 Aftercare following joint replacement surgery: Secondary | ICD-10-CM | POA: Diagnosis not present

## 2015-10-23 DIAGNOSIS — I10 Essential (primary) hypertension: Secondary | ICD-10-CM | POA: Diagnosis not present

## 2015-10-23 DIAGNOSIS — Z8541 Personal history of malignant neoplasm of cervix uteri: Secondary | ICD-10-CM | POA: Diagnosis not present

## 2015-10-23 DIAGNOSIS — E538 Deficiency of other specified B group vitamins: Secondary | ICD-10-CM | POA: Diagnosis not present

## 2015-10-23 DIAGNOSIS — M19012 Primary osteoarthritis, left shoulder: Secondary | ICD-10-CM | POA: Diagnosis not present

## 2015-10-23 DIAGNOSIS — Z7984 Long term (current) use of oral hypoglycemic drugs: Secondary | ICD-10-CM | POA: Diagnosis not present

## 2015-10-23 DIAGNOSIS — M75122 Complete rotator cuff tear or rupture of left shoulder, not specified as traumatic: Secondary | ICD-10-CM | POA: Diagnosis not present

## 2015-10-23 DIAGNOSIS — M1991 Primary osteoarthritis, unspecified site: Secondary | ICD-10-CM | POA: Diagnosis not present

## 2015-10-23 DIAGNOSIS — M069 Rheumatoid arthritis, unspecified: Secondary | ICD-10-CM | POA: Diagnosis not present

## 2015-10-23 DIAGNOSIS — Z96619 Presence of unspecified artificial shoulder joint: Secondary | ICD-10-CM

## 2015-10-23 DIAGNOSIS — J984 Other disorders of lung: Secondary | ICD-10-CM | POA: Diagnosis not present

## 2015-10-23 DIAGNOSIS — Z79899 Other long term (current) drug therapy: Secondary | ICD-10-CM | POA: Diagnosis not present

## 2015-10-23 DIAGNOSIS — Z888 Allergy status to other drugs, medicaments and biological substances status: Secondary | ICD-10-CM | POA: Diagnosis not present

## 2015-10-23 DIAGNOSIS — Z86711 Personal history of pulmonary embolism: Secondary | ICD-10-CM | POA: Diagnosis not present

## 2015-10-23 DIAGNOSIS — M75102 Unspecified rotator cuff tear or rupture of left shoulder, not specified as traumatic: Principal | ICD-10-CM | POA: Diagnosis present

## 2015-10-23 DIAGNOSIS — Z7401 Bed confinement status: Secondary | ICD-10-CM | POA: Diagnosis not present

## 2015-10-23 DIAGNOSIS — R262 Difficulty in walking, not elsewhere classified: Secondary | ICD-10-CM | POA: Diagnosis not present

## 2015-10-23 DIAGNOSIS — M1288 Other specific arthropathies, not elsewhere classified, other specified site: Secondary | ICD-10-CM | POA: Diagnosis not present

## 2015-10-23 DIAGNOSIS — E1140 Type 2 diabetes mellitus with diabetic neuropathy, unspecified: Secondary | ICD-10-CM | POA: Diagnosis present

## 2015-10-23 DIAGNOSIS — M12812 Other specific arthropathies, not elsewhere classified, left shoulder: Secondary | ICD-10-CM | POA: Diagnosis not present

## 2015-10-23 DIAGNOSIS — E119 Type 2 diabetes mellitus without complications: Secondary | ICD-10-CM | POA: Diagnosis present

## 2015-10-23 HISTORY — DX: Other pulmonary embolism without acute cor pulmonale: I26.99

## 2015-10-23 HISTORY — PX: REVERSE SHOULDER ARTHROPLASTY: SHX5054

## 2015-10-23 HISTORY — DX: Other diseases of bronchus, not elsewhere classified: J98.09

## 2015-10-23 LAB — GLUCOSE, CAPILLARY
Glucose-Capillary: 122 mg/dL — ABNORMAL HIGH (ref 65–99)
Glucose-Capillary: 135 mg/dL — ABNORMAL HIGH (ref 65–99)
Glucose-Capillary: 108 mg/dL — ABNORMAL HIGH (ref 65–99)

## 2015-10-23 SURGERY — ARTHROPLASTY, SHOULDER, TOTAL, REVERSE
Anesthesia: General | Laterality: Left | Wound class: Clean

## 2015-10-23 MED ORDER — TRANEXAMIC ACID 1000 MG/10ML IV SOLN
INTRAVENOUS | Status: AC
  Filled 2015-10-23: qty 10

## 2015-10-23 MED ORDER — SODIUM CHLORIDE 0.9 % IV SOLN
INTRAVENOUS | Status: DC | PRN
  Administered 2015-10-23: 60 mL

## 2015-10-23 MED ORDER — ONDANSETRON HCL 4 MG PO TABS: 4.0000 mg | ORAL_TABLET | Freq: Four times a day (QID) | ORAL | Status: DC | PRN

## 2015-10-23 MED ORDER — ACETAMINOPHEN 650 MG RE SUPP: 650.0000 mg | Freq: Four times a day (QID) | RECTAL | Status: DC | PRN

## 2015-10-23 MED ORDER — ONDANSETRON HCL 4 MG/2ML IJ SOLN: 4.0000 mg | Freq: Four times a day (QID) | INTRAMUSCULAR | Status: DC | PRN

## 2015-10-23 MED ORDER — CEFAZOLIN SODIUM-DEXTROSE 2-4 GM/100ML-% IV SOLN: 2.0000 g | Freq: Once | INTRAVENOUS | Status: DC

## 2015-10-23 MED ORDER — BUPIVACAINE LIPOSOME 1.3 % IJ SUSP
INTRAMUSCULAR | Status: AC
  Filled 2015-10-23: qty 20

## 2015-10-23 MED ORDER — BUPIVACAINE-EPINEPHRINE (PF) 0.5% -1:200000 IJ SOLN
INTRAMUSCULAR | Status: AC
  Filled 2015-10-23: qty 30

## 2015-10-23 MED ORDER — MIDAZOLAM HCL 2 MG/2ML IJ SOLN
INTRAMUSCULAR | Status: DC | PRN
  Administered 2015-10-23: 1 mg via INTRAVENOUS

## 2015-10-23 MED ORDER — ASPIRIN EC 81 MG PO TBEC
81.0000 mg | DELAYED_RELEASE_TABLET | Freq: Every day | ORAL | Status: DC
Start: 2015-10-23 — End: 2015-10-24
  Administered 2015-10-24: 81 mg via ORAL
  Filled 2015-10-23: qty 1

## 2015-10-23 MED ORDER — FENTANYL CITRATE (PF) 100 MCG/2ML IJ SOLN
INTRAMUSCULAR | Status: AC
  Filled 2015-10-23: qty 2

## 2015-10-23 MED ORDER — SULFAMETHOXAZOLE-TRIMETHOPRIM 800-160 MG PO TABS
1.0000 | ORAL_TABLET | Freq: Two times a day (BID) | ORAL | Status: DC
  Administered 2015-10-23 – 2015-10-24 (×2): 1 via ORAL
  Filled 2015-10-23 (×3): qty 1

## 2015-10-23 MED ORDER — PANTOPRAZOLE SODIUM 40 MG PO TBEC
40.0000 mg | DELAYED_RELEASE_TABLET | Freq: Two times a day (BID) | ORAL | Status: DC
  Administered 2015-10-23 – 2015-10-24 (×3): 40 mg via ORAL
  Filled 2015-10-23 (×3): qty 1

## 2015-10-23 MED ORDER — BUPIVACAINE LIPOSOME 1.3 % IJ SUSP
INTRAMUSCULAR | Status: AC
Start: 2015-10-23 — End: 2015-10-23
  Filled 2015-10-23: qty 20

## 2015-10-23 MED ORDER — HYDROMORPHONE HCL 1 MG/ML IJ SOLN
0.2500 mg | INTRAMUSCULAR | Status: DC | PRN
  Administered 2015-10-23: 0.5 mg via INTRAVENOUS

## 2015-10-23 MED ORDER — TRANEXAMIC ACID 1000 MG/10ML IV SOLN
INTRAVENOUS | Status: DC | PRN
  Administered 2015-10-23: 1000 mg via INTRAVENOUS

## 2015-10-23 MED ORDER — HYDROMORPHONE HCL 1 MG/ML IJ SOLN
1.0000 mg | INTRAMUSCULAR | Status: DC | PRN
  Administered 2015-10-23 (×2): 1 mg via INTRAVENOUS
  Filled 2015-10-23 (×2): qty 1

## 2015-10-23 MED ORDER — ONDANSETRON HCL 4 MG/2ML IJ SOLN
INTRAMUSCULAR | Status: DC | PRN
  Administered 2015-10-23: 4 mg via INTRAVENOUS

## 2015-10-23 MED ORDER — EPHEDRINE SULFATE 50 MG/ML IJ SOLN
INTRAMUSCULAR | Status: DC | PRN
  Administered 2015-10-23: 10 mg via INTRAVENOUS

## 2015-10-23 MED ORDER — MAGNESIUM HYDROXIDE 400 MG/5ML PO SUSP
30.0000 mL | Freq: Every day | ORAL | Status: DC | PRN
  Filled 2015-10-23: qty 30

## 2015-10-23 MED ORDER — FENTANYL CITRATE (PF) 100 MCG/2ML IJ SOLN
25.0000 ug | INTRAMUSCULAR | Status: DC | PRN
  Administered 2015-10-23 (×2): 50 ug via INTRAVENOUS

## 2015-10-23 MED ORDER — OXYCODONE HCL 5 MG/5ML PO SOLN: 5.0000 mg | Freq: Once | ORAL | Status: DC | PRN

## 2015-10-23 MED ORDER — HYDROMORPHONE HCL 1 MG/ML IJ SOLN
0.2500 mg | INTRAMUSCULAR | Status: DC | PRN
  Administered 2015-10-23 (×3): 0.5 mg via INTRAVENOUS

## 2015-10-23 MED ORDER — NEOMYCIN-POLYMYXIN B GU 40-200000 IR SOLN
Status: AC
  Filled 2015-10-23: qty 20

## 2015-10-23 MED ORDER — PROPOFOL 10 MG/ML IV BOLUS
INTRAVENOUS | Status: DC | PRN
  Administered 2015-10-23: 200 mg via INTRAVENOUS

## 2015-10-23 MED ORDER — ALBUTEROL SULFATE (2.5 MG/3ML) 0.083% IN NEBU: 2.5000 mg | INHALATION_SOLUTION | RESPIRATORY_TRACT | Status: DC | PRN

## 2015-10-23 MED ORDER — METOCLOPRAMIDE HCL 10 MG PO TABS: 5.0000 mg | ORAL_TABLET | Freq: Three times a day (TID) | ORAL | Status: DC | PRN

## 2015-10-23 MED ORDER — VITAMIN B-12 1000 MCG PO TABS
1000.0000 ug | ORAL_TABLET | Freq: Every day | ORAL | Status: DC
  Administered 2015-10-24: 1000 ug via ORAL
  Filled 2015-10-23: qty 1

## 2015-10-23 MED ORDER — ROCURONIUM BROMIDE 100 MG/10ML IV SOLN
INTRAVENOUS | Status: DC | PRN
  Administered 2015-10-23: 10 mg via INTRAVENOUS
  Administered 2015-10-23: 50 mg via INTRAVENOUS
  Administered 2015-10-23: 5 mg via INTRAVENOUS

## 2015-10-23 MED ORDER — ADULT MULTIVITAMIN W/MINERALS CH
1.0000 | ORAL_TABLET | Freq: Every day | ORAL | Status: DC
  Administered 2015-10-24: 1 via ORAL
  Filled 2015-10-23: qty 1

## 2015-10-23 MED ORDER — ARTIFICIAL TEARS OP OINT
TOPICAL_OINTMENT | OPHTHALMIC | Status: DC | PRN
  Administered 2015-10-23: 1 via OPHTHALMIC

## 2015-10-23 MED ORDER — CEFAZOLIN SODIUM-DEXTROSE 2-4 GM/100ML-% IV SOLN
2.0000 g | Freq: Four times a day (QID) | INTRAVENOUS | Status: AC
  Administered 2015-10-23 – 2015-10-24 (×3): 2 g via INTRAVENOUS
  Filled 2015-10-23 (×3): qty 100

## 2015-10-23 MED ORDER — METOCLOPRAMIDE HCL 5 MG/ML IJ SOLN: 5.0000 mg | Freq: Three times a day (TID) | INTRAMUSCULAR | Status: DC | PRN

## 2015-10-23 MED ORDER — METHOCARBAMOL 500 MG PO TABS
500.0000 mg | ORAL_TABLET | Freq: Three times a day (TID) | ORAL | Status: DC | PRN
  Administered 2015-10-24: 500 mg via ORAL
  Filled 2015-10-23: qty 1

## 2015-10-23 MED ORDER — SODIUM CHLORIDE 0.9 % IJ SOLN
INTRAMUSCULAR | Status: AC
  Filled 2015-10-23: qty 50

## 2015-10-23 MED ORDER — FAMOTIDINE 20 MG PO TABS
20.0000 mg | ORAL_TABLET | Freq: Once | ORAL | Status: AC
  Administered 2015-10-23: 20 mg via ORAL

## 2015-10-23 MED ORDER — BUPIVACAINE-EPINEPHRINE (PF) 0.5% -1:200000 IJ SOLN
INTRAMUSCULAR | Status: DC | PRN
  Administered 2015-10-23: 30 mL via PERINEURAL

## 2015-10-23 MED ORDER — SODIUM CHLORIDE 0.9 % IV SOLN
INTRAVENOUS | Status: DC
  Administered 2015-10-23 (×2): via INTRAVENOUS

## 2015-10-23 MED ORDER — ATENOLOL 50 MG PO TABS
50.0000 mg | ORAL_TABLET | Freq: Every day | ORAL | Status: DC
  Filled 2015-10-23: qty 1

## 2015-10-23 MED ORDER — HYDROMORPHONE HCL 1 MG/ML IJ SOLN
INTRAMUSCULAR | Status: AC
  Filled 2015-10-23: qty 1

## 2015-10-23 MED ORDER — OXYCODONE HCL 5 MG PO TABS: 5.0000 mg | ORAL_TABLET | Freq: Once | ORAL | Status: DC | PRN

## 2015-10-23 MED ORDER — AMLODIPINE BESYLATE 10 MG PO TABS
10.0000 mg | ORAL_TABLET | Freq: Every day | ORAL | Status: DC
  Filled 2015-10-23: qty 1

## 2015-10-23 MED ORDER — CEFAZOLIN SODIUM-DEXTROSE 2-4 GM/100ML-% IV SOLN
INTRAVENOUS | Status: AC
  Filled 2015-10-23: qty 100

## 2015-10-23 MED ORDER — FENTANYL CITRATE (PF) 100 MCG/2ML IJ SOLN
INTRAMUSCULAR | Status: DC | PRN
  Administered 2015-10-23: 25 ug via INTRAVENOUS
  Administered 2015-10-23: 50 ug via INTRAVENOUS
  Administered 2015-10-23: 25 ug via INTRAVENOUS

## 2015-10-23 MED ORDER — FAMOTIDINE 20 MG PO TABS
ORAL_TABLET | ORAL | Status: AC
  Filled 2015-10-23: qty 1

## 2015-10-23 MED ORDER — GLUCOSE BLOOD VI STRP: ORAL_STRIP | Status: AC

## 2015-10-23 MED ORDER — METFORMIN HCL 500 MG PO TABS
500.0000 mg | ORAL_TABLET | Freq: Every day | ORAL | Status: DC
  Administered 2015-10-24: 500 mg via ORAL
  Filled 2015-10-23: qty 1

## 2015-10-23 MED ORDER — ENOXAPARIN SODIUM 40 MG/0.4ML ~~LOC~~ SOLN
40.0000 mg | SUBCUTANEOUS | Status: DC
  Administered 2015-10-24: 40 mg via SUBCUTANEOUS
  Filled 2015-10-23: qty 0.4

## 2015-10-23 MED ORDER — CEFAZOLIN SODIUM-DEXTROSE 2-3 GM-% IV SOLR
INTRAVENOUS | Status: DC | PRN
  Administered 2015-10-23: 2 g via INTRAVENOUS

## 2015-10-23 MED ORDER — OXYCODONE HCL 5 MG PO TABS
5.0000 mg | ORAL_TABLET | ORAL | Status: DC | PRN
  Administered 2015-10-23: 5 mg via ORAL
  Administered 2015-10-24: 10 mg via ORAL
  Administered 2015-10-24 (×3): 5 mg via ORAL
  Filled 2015-10-23: qty 2
  Filled 2015-10-23 (×4): qty 1

## 2015-10-23 MED ORDER — KCL IN DEXTROSE-NACL 20-5-0.9 MEQ/L-%-% IV SOLN
INTRAVENOUS | Status: DC
  Administered 2015-10-23 – 2015-10-24 (×2): via INTRAVENOUS
  Filled 2015-10-23 (×4): qty 1000

## 2015-10-23 MED ORDER — KETOROLAC TROMETHAMINE 30 MG/ML IJ SOLN
INTRAMUSCULAR | Status: DC | PRN
  Administered 2015-10-23: 30 mg via INTRAVENOUS

## 2015-10-23 MED ORDER — LIDOCAINE HCL (CARDIAC) 20 MG/ML IV SOLN
INTRAVENOUS | Status: DC | PRN
  Administered 2015-10-23: 100 mg via INTRAVENOUS

## 2015-10-23 MED ORDER — ACETAMINOPHEN 325 MG PO TABS: 650.0000 mg | ORAL_TABLET | Freq: Four times a day (QID) | ORAL | Status: DC | PRN

## 2015-10-23 MED ORDER — BISACODYL 10 MG RE SUPP: 10.0000 mg | Freq: Every day | RECTAL | Status: DC | PRN

## 2015-10-23 MED ORDER — GABAPENTIN 300 MG PO CAPS
600.0000 mg | ORAL_CAPSULE | Freq: Three times a day (TID) | ORAL | Status: DC
  Administered 2015-10-23 – 2015-10-24 (×3): 600 mg via ORAL
  Filled 2015-10-23 (×3): qty 2

## 2015-10-23 MED ORDER — DIPHENHYDRAMINE HCL 12.5 MG/5ML PO ELIX
12.5000 mg | ORAL_SOLUTION | ORAL | Status: DC | PRN
  Filled 2015-10-23: qty 10

## 2015-10-23 MED ORDER — HYDROMORPHONE HCL 1 MG/ML IJ SOLN
INTRAMUSCULAR | Status: AC
Start: 2015-10-23 — End: 2015-10-24
  Filled 2015-10-23: qty 1

## 2015-10-23 MED ORDER — ALBUTEROL SULFATE HFA 108 (90 BASE) MCG/ACT IN AERS: 2.0000 | INHALATION_SPRAY | RESPIRATORY_TRACT | Status: DC | PRN

## 2015-10-23 MED ORDER — VITAMIN D 1000 UNITS PO TABS
1000.0000 [IU] | ORAL_TABLET | Freq: Every day | ORAL | Status: DC
  Administered 2015-10-24: 1000 [IU] via ORAL
  Filled 2015-10-23: qty 1

## 2015-10-23 MED ORDER — DOCUSATE SODIUM 100 MG PO CAPS
100.0000 mg | ORAL_CAPSULE | Freq: Two times a day (BID) | ORAL | Status: DC
Start: 2015-10-23 — End: 2015-10-24
  Administered 2015-10-23 – 2015-10-24 (×2): 100 mg via ORAL
  Filled 2015-10-23 (×2): qty 1

## 2015-10-23 MED ORDER — GLYCOPYRROLATE 0.2 MG/ML IJ SOLN
INTRAMUSCULAR | Status: DC | PRN
  Administered 2015-10-23: 0.6 mg via INTRAVENOUS

## 2015-10-23 MED ORDER — PHENYLEPHRINE HCL 10 MG/ML IJ SOLN
INTRAMUSCULAR | Status: DC | PRN
  Administered 2015-10-23 (×3): 50 ug via INTRAVENOUS
  Administered 2015-10-23: 200 ug via INTRAVENOUS

## 2015-10-23 MED ORDER — NEOMYCIN-POLYMYXIN B GU 40-200000 IR SOLN
Status: DC | PRN
  Administered 2015-10-23: 16 mL

## 2015-10-23 MED ORDER — FLEET ENEMA 7-19 GM/118ML RE ENEM: 1.0000 | ENEMA | Freq: Once | RECTAL | Status: DC | PRN

## 2015-10-23 MED ORDER — NEOSTIGMINE METHYLSULFATE 10 MG/10ML IV SOLN
INTRAVENOUS | Status: DC | PRN
Start: 2015-10-23 — End: 2015-10-23
  Administered 2015-10-23: 3 mg via INTRAVENOUS

## 2015-10-23 MED ORDER — OXYBUTYNIN CHLORIDE 5 MG PO TABS
5.0000 mg | ORAL_TABLET | Freq: Three times a day (TID) | ORAL | Status: DC
  Administered 2015-10-23 – 2015-10-24 (×2): 5 mg via ORAL
  Filled 2015-10-23 (×2): qty 1

## 2015-10-23 MED ORDER — TRAMADOL HCL 50 MG PO TABS
50.0000 mg | ORAL_TABLET | Freq: Four times a day (QID) | ORAL | Status: DC | PRN
  Administered 2015-10-23 – 2015-10-24 (×3): 50 mg via ORAL
  Filled 2015-10-23 (×3): qty 1

## 2015-10-23 MED ORDER — ACETAMINOPHEN 500 MG PO TABS
1000.0000 mg | ORAL_TABLET | Freq: Four times a day (QID) | ORAL | Status: AC
  Administered 2015-10-23 – 2015-10-24 (×3): 1000 mg via ORAL
  Filled 2015-10-23 (×3): qty 2

## 2015-10-23 SURGICAL SUPPLY — 71 items
BAG DECANTER FOR FLEXI CONT (MISCELLANEOUS) IMPLANT
BIT DRILL F/CENTRAL SCRW 3.2 (BIT) ×1
BIT DRILL F/CENTRAL SCRW 3.2MM (BIT) ×1 IMPLANT
BIT DRILL TWIST 2.7 (BIT) IMPLANT
BIT DRILL TWIST 2.7MM (BIT)
BLADE SAGITTAL WIDE XTHICK NO (BLADE) ×3 IMPLANT
BOWL CEMENT MIX W/ADAPTER (MISCELLANEOUS) IMPLANT
CANISTER SUCT 1200ML W/VALVE (MISCELLANEOUS) ×3 IMPLANT
CANISTER SUCT 3000ML PPV (MISCELLANEOUS) ×6 IMPLANT
CAPT SHLDR REVTOTAL 2 ×3 IMPLANT
CATH TRAY METER 16FR LF (MISCELLANEOUS) ×3 IMPLANT
CHLORAPREP W/TINT 26ML (MISCELLANEOUS) ×6 IMPLANT
COOLER POLAR GLACIER W/PUMP (MISCELLANEOUS) ×3 IMPLANT
DECANTER SPIKE VIAL GLASS SM (MISCELLANEOUS) ×9 IMPLANT
DRAPE FLUOR MINI C-ARM 54X84 (DRAPES) IMPLANT
DRAPE IMP U-DRAPE 54X76 (DRAPES) ×6 IMPLANT
DRAPE INCISE IOBAN 66X45 STRL (DRAPES) ×6 IMPLANT
DRAPE INCISE IOBAN 66X60 STRL (DRAPES) ×3 IMPLANT
DRAPE SHEET LG 3/4 BI-LAMINATE (DRAPES) ×6 IMPLANT
DRAPE TABLE BACK 80X90 (DRAPES) ×3 IMPLANT
DRILL BIT F/CENTRAL SCRW 3.2MM (BIT) ×2
DRSG OPSITE POSTOP 4X8 (GAUZE/BANDAGES/DRESSINGS) ×3 IMPLANT
ELECT CAUTERY BLADE 6.4 (BLADE) ×3 IMPLANT
GAUZE PACK 2X3YD (MISCELLANEOUS) ×3 IMPLANT
GLOVE BIO SURGEON STRL SZ7.5 (GLOVE) ×9 IMPLANT
GLOVE BIO SURGEON STRL SZ8 (GLOVE) ×6 IMPLANT
GLOVE BIOGEL PI IND STRL 8 (GLOVE) ×5 IMPLANT
GLOVE BIOGEL PI INDICATOR 8 (GLOVE) ×10
GLOVE INDICATOR 8.0 STRL GRN (GLOVE) ×3 IMPLANT
GOWN STRL REUS W/ TWL LRG LVL3 (GOWN DISPOSABLE) ×2 IMPLANT
GOWN STRL REUS W/ TWL XL LVL3 (GOWN DISPOSABLE) ×1 IMPLANT
GOWN STRL REUS W/TWL LRG LVL3 (GOWN DISPOSABLE) ×4
GOWN STRL REUS W/TWL XL LVL3 (GOWN DISPOSABLE) ×2
HANDPIECE SUCTION TUBG SURGILV (MISCELLANEOUS) ×3 IMPLANT
HOOD PEEL AWAY FLYTE STAYCOOL (MISCELLANEOUS) ×9 IMPLANT
KIT RM TURNOVER STRD PROC AR (KITS) ×3 IMPLANT
KIT STABILIZATION SHOULDER (MISCELLANEOUS) ×3 IMPLANT
MASK FACE SPIDER DISP (MASK) ×3 IMPLANT
NDL MAYO CATGUT SZ1 (NEEDLE) ×3
NDL MAYO CATGUT SZ5 (NEEDLE) ×2
NDL SAFETY 22GX1.5 (NEEDLE) ×3 IMPLANT
NDL SUT 5 .5 CRC TPR PNT MAYO (NEEDLE) ×1 IMPLANT
NEEDLE 18GX1X1/2 (RX/OR ONLY) (NEEDLE) ×3 IMPLANT
NEEDLE HYPO 25X1 1.5 SAFETY (NEEDLE) ×3 IMPLANT
NEEDLE MAYO CATGUT SZ1 (NEEDLE) ×1 IMPLANT
NEEDLE MAYO CATGUT SZ4 (NEEDLE) ×3 IMPLANT
NEEDLE SPNL 20GX3.5 QUINCKE YW (NEEDLE) ×3 IMPLANT
NS IRRIG 1000ML POUR BTL (IV SOLUTION) ×3 IMPLANT
PACK ARTHROSCOPY SHOULDER (MISCELLANEOUS) ×3 IMPLANT
PAD WRAPON POLAR SHDR UNIV (MISCELLANEOUS) ×1 IMPLANT
PIN THREADED REVERSE (PIN) ×3 IMPLANT
SLING ULTRA II LG (MISCELLANEOUS) ×3 IMPLANT
SLING ULTRA II M (MISCELLANEOUS) IMPLANT
SOL .9 NS 3000ML IRR  AL (IV SOLUTION) ×2
SOL .9 NS 3000ML IRR UROMATIC (IV SOLUTION) ×1 IMPLANT
SPONGE LAP 18X18 5 PK (GAUZE/BANDAGES/DRESSINGS) IMPLANT
STAPLER SKIN PROX 35W (STAPLE) ×3 IMPLANT
SUT ETHIBOND 0 MO6 C/R (SUTURE) ×3 IMPLANT
SUT ETHIBOND NAB CT1 #1 30IN (SUTURE) IMPLANT
SUT FIBERWIRE #2 38 BLUE 1/2 (SUTURE) ×12
SUT STEEL 5 (SUTURE) IMPLANT
SUT VIC AB 0 CT1 36 (SUTURE) ×3 IMPLANT
SUT VIC AB 2-0 CT1 27 (SUTURE) ×4
SUT VIC AB 2-0 CT1 TAPERPNT 27 (SUTURE) ×2 IMPLANT
SUTURE FIBERWR #2 38 BLUE 1/2 (SUTURE) ×4 IMPLANT
SYR 30ML LL (SYRINGE) ×9 IMPLANT
SYR TB 1ML LUER SLIP (SYRINGE) IMPLANT
SYRINGE 10CC LL (SYRINGE) ×3 IMPLANT
TUBE CONNECTING  6'X3/16 (MISCELLANEOUS) ×1
TUBE CONNECTING 6X3/16 (MISCELLANEOUS) ×2 IMPLANT
WRAPON POLAR PAD SHDR UNIV (MISCELLANEOUS) ×3

## 2015-10-23 NOTE — Transfer of Care (Signed)
Immediate Anesthesia Transfer of Care Note  Patient: Susan Pratt  Procedure(s) Performed: Procedure(s): REVERSE SHOULDER ARTHROPLASTY (Left)  Patient Location: PACU  Anesthesia Type:General  Level of Consciousness: awake, oriented and patient cooperative  Airway & Oxygen Therapy: Patient Spontanous Breathing and Patient connected to face mask oxygen  Post-op Assessment: Report given to RN and Post -op Vital signs reviewed and stable  Post vital signs: Reviewed and stable  Last Vitals:  Filed Vitals:   10/23/15 0734 10/23/15 1256  BP: 147/75 128/66  Pulse: 66 83  Temp: 35.7 C 36.3 C  Resp: 16 10    Last Pain:  Filed Vitals:   10/23/15 1258  PainSc: 10-Worst pain ever         Complications: No apparent anesthesia complications

## 2015-10-23 NOTE — Progress Notes (Signed)
Discussion with Winifred Olive, RN  and Legrand Pitts, RN - aware pt's bladder stim is on and she did not bring control to do so.  OK to proceed to OR per Winifred Olive, RN

## 2015-10-23 NOTE — Op Note (Signed)
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  Post-Op Diagnosis:   Same.  Procedure:   Reverse left total shoulder arthroplasty.  Surgeon:   Pascal Lux, MD  Assistant:   Cameron Proud, PA-C  Anesthesia:   General endotracheal with an interscalene block placed preoperatively by the anesthesiologist.  Findings:   As above.  Complications:   None  EBL:  600 cc  Fluids:   900 cc crystalloid  UOP:  200 cc  TT:   None  Drains:   None  Closure:   Staples  Implants:   All press-fit Biomet Comprehensive system with a #12 mini-humeral stem, a 44 mm humeral tray with a standard insert, and a standard baseplate with a 36 mm glenosphere.  Brief Clinical Note:   The patient is a 75 year old female with a long history of progressively worsening right shoulder pain. Her symptoms have progressed despite medications, activity modification, injections, etc. Her history and examination were consistent with severe cuff arthropathy with a high riding humeral head and significant osteophyte formation. These findings were verified by an arthro/CT scan. The patient presents at this time for a reverse left total shoulder arthroplasty.  Procedure:   The patient was brought into the operating room and lain in the supine position on the OR table. After adequate general endotracheal intubation and anesthesia was obtained, a Foley catheter was inserted and the patient repositioned in the beach chair position using the beach chair positioner. The patient was not deemed to be a candidate for a supraclavicular block by the anesthesiologist due to her underlying pulmonary issues. The left shoulder and upper extremity were prepped with ChloraPrep solution before being draped sterilely. Preoperative antibiotics were administered. A standard anterior approach to the shoulder was made through an  approximately 4-5 inch incision. The incision was carried down through the subcutaneous tissues to expose the deltopectoral fascia. The interval between the deltoid and pectoralis muscles was identified and this plane developed, retracting the cephalic vein laterally with the deltoid muscle. The conjoined tendon was identified. Its lateral margin was dissected and the Kolbel self-retraining retractor inserted. The "three sisters" were identified and cauterized. Bursal tissues were removed to improve visualization. The subscapularis tendon was released from its attachment to the lesser tuberosity 1 cm proximal to its insertion and several tagging sutures placed. The inferior capsule was released with care after identifying and protecting the axillary nerve. The proximal humeral cut was made at approximately 30 of retroversion using the extra-medullary guide.   Attention was redirected to the glenoid. The labrum was debrided circumferentially before the center of the glenoid was marked with electrocautery. The guidewire was drilled into the glenoid neck using the appropriate guide. After verifying its position, it was overreamed with the standard baseplate reamer to create a flat surface. The permanent standard baseplate was impacted into place. It was stabilized with a 25 x 6.5 mm central screw and four peripheral screws. Locking screws were placed superiorly and inferiorly while nonlocking screws were placed anteriorly and posteriorly. The permanent 36 mm glenosphere was then impacted into place and its Morse taper locking mechanism verified using manual distraction.  Attention was directed to the humeral side. The humeral canal was reamed sequentially beginning with the end-cutting reamer then progressing from a 4 mm reamer up to a 12 mm reamer. This provided excellent circumferential chatter. The canal was broached beginning with  a #5 broach and progressing to a #12 broach. This was left in place and a trial  reduction performed using the standard trial humeral platform. The arm demonstrated excellent range of motion as the hand could be brought across the chest to the opposite shoulder and brought to the top of the patient's head and to the patient's ear. The shoulder appeared stable throughout this range of motion. The joint was dislocated and the trial components removed. The permanent #12 mini-stem was impacted into place with care taken to maintain the appropriate version. The permanent 44 mm humeral platform with the standard insert was put together on the back table and impacted into place. Again, the [facility] taper locking mechanism was verified using manual distraction. The shoulder was relocated using two finger pressure and again placed through a range of motion with the findings as described above.  The wound was copiously irrigated with bacitracin saline solution using the jet lavage system before a total of 20 cc of Exparel diluted out to 60 cc with normal saline and 30 cc of 0.5% Sensorcaine with epinephrine was injected into the pericapsular and peri-incisional tissues to help with postoperative analgesia. The subscapularis tendon was reapproximated using #2 FiberWire interrupted sutures. The deltopectoral interval was closed using 2-0 Vicryl interrupted sutures before the subcutaneous tissues also were closed using 2-0 Vicryl interrupted sutures. The skin was closed using staples. Prior to closing the skin, 1 g of transexemic acid in 10 cc of normal saline was injected intra-articularly to help with postoperative bleeding. A sterile occlusive dressing was applied to the wound before the arm was placed into a shoulder immobilizer with an abduction pillow. A Polar Care system also was applied to the shoulder. The patient was then transferred back to a hospital bed before being awakened, extubated, and returned to the recovery room in satisfactory condition after tolerating the procedure well.

## 2015-10-23 NOTE — Anesthesia Procedure Notes (Signed)
Procedure Name: Intubation Date/Time: 10/23/2015 10:09 AM Performed by: Courtney Paris Pre-anesthesia Checklist: Patient identified, Emergency Drugs available, Suction available, Patient being monitored and Timeout performed Patient Re-evaluated:Patient Re-evaluated prior to inductionOxygen Delivery Method: Circle system utilized Preoxygenation: Pre-oxygenation with 100% oxygen Intubation Type: Combination inhalational/ intravenous induction and IV induction Ventilation: Mask ventilation without difficulty Laryngoscope Size: Miller and 3 Grade View: Grade II Tube type: Oral Tube size: 7.0 mm Number of attempts: 1 Airway Equipment and Method: Stylet Placement Confirmation: ETT inserted through vocal cords under direct vision,  positive ETCO2,  CO2 detector and breath sounds checked- equal and bilateral Secured at: 21 cm Tube secured with: Tape Dental Injury: Teeth and Oropharynx as per pre-operative assessment

## 2015-10-23 NOTE — Anesthesia Preprocedure Evaluation (Signed)
Anesthesia Evaluation  Patient identified by MRN, date of birth, ID band Patient awake    Reviewed: Allergy & Precautions, H&P , NPO status , Patient's Chart, lab work & pertinent test results  History of Anesthesia Complications Negative for: history of anesthetic complications  Airway Mallampati: III  TM Distance: >3 FB Neck ROM: limited    Dental  (+) Poor Dentition, Chipped, Upper Dentures, Partial Lower, Missing   Pulmonary neg shortness of breath, asthma , COPD,    Pulmonary exam normal breath sounds clear to auscultation       Cardiovascular Exercise Tolerance: Good hypertension, (-) angina(-) Past MI and (-) DOE Normal cardiovascular exam Rhythm:regular Rate:Normal     Neuro/Psych  Neuromuscular disease negative psych ROS   GI/Hepatic negative GI ROS, Neg liver ROS,   Endo/Other  diabetes, Type 2  Renal/GU negative Renal ROS  negative genitourinary   Musculoskeletal  (+) Arthritis ,   Abdominal   Peds  Hematology negative hematology ROS (+)   Anesthesia Other Findings Past Medical History:   Hypertension                                                 Hyperlipidemia                                               Cataract                                                     Diabetes mellitus without complication (HCC)                 Arthritis                                                      Comment:ra   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   Incontinence in female                                         Comment:urinary   Cancer ([place])                                                   Comment:cervical   Gait difficulty  Comment:limited mobility due to nerve damage   uses               walker   PE (pulmonary embolism)                                        Comment:2015   Bronchospastic airway disease                                Past Surgical History:   KNEE SURGERY                                                  EYE SURGERY                                                   ABDOMINAL HYSTERECTOMY                                        APPENDECTOMY                                                  CHOLECYSTECTOMY                                               JOINT REPLACEMENT                                             BACK SURGERY                                                    Comment:2010   BLADDER SURGERY                                                 Comment:STIMULATOR  BMI    Body Mass Index   37.04 kg/m 2    Patient endorses weakness in operative arm and shoulder  Reproductive/Obstetrics negative OB ROS                             Anesthesia Physical Anesthesia Plan  ASA: III  Anesthesia Plan: General ETT   Post-op Pain Management:  Regional for Post-op pain   Induction:   Airway Management Planned:   Additional Equipment:   Intra-op Plan:  Post-operative Plan:   Informed Consent: I have reviewed the patients History and Physical, chart, labs and discussed the procedure including the risks, benefits and alternatives for the proposed anesthesia with the patient or authorized representative who has indicated his/her understanding and acceptance.   Dental Advisory Given  Plan Discussed with: Anesthesiologist, CRNA and Surgeon  Anesthesia Plan Comments: (Concern for placing interscalene 2/2 to pulmonary history.  Consented for post op block prn.)        Anesthesia Quick Evaluation

## 2015-10-23 NOTE — Telephone Encounter (Signed)
Sent to [place] 

## 2015-10-23 NOTE — Progress Notes (Signed)
TC to [facility] for muscle spasms, new order for robaxin prn. Will continue to monitor

## 2015-10-23 NOTE — H&P (Signed)
Paper H&P to be scanned into permanent record. H&P reviewed. No changes. 

## 2015-10-23 NOTE — Addendum Note (Signed)
Addended by: Adline Potter on: 10/23/2015 01:40 PM   Modules accepted: Orders

## 2015-10-24 ENCOUNTER — Encounter: Payer: Self-pay | Admitting: Surgery

## 2015-10-24 ENCOUNTER — Encounter
Admission: RE | Admit: 2015-10-24 | Discharge: 2015-10-24 | Disposition: A | Discharge status: Home / Self Care | Payer: Medicare Other | Source: Ambulatory Visit | Attending: Internal Medicine | Admitting: Internal Medicine

## 2015-10-24 DIAGNOSIS — E119 Type 2 diabetes mellitus without complications: Secondary | ICD-10-CM | POA: Insufficient documentation

## 2015-10-24 DIAGNOSIS — M25512 Pain in left shoulder: Secondary | ICD-10-CM | POA: Diagnosis not present

## 2015-10-24 DIAGNOSIS — E538 Deficiency of other specified B group vitamins: Secondary | ICD-10-CM | POA: Diagnosis not present

## 2015-10-24 DIAGNOSIS — Z471 Aftercare following joint replacement surgery: Secondary | ICD-10-CM | POA: Diagnosis not present

## 2015-10-24 DIAGNOSIS — I10 Essential (primary) hypertension: Secondary | ICD-10-CM | POA: Diagnosis not present

## 2015-10-24 DIAGNOSIS — R252 Cramp and spasm: Secondary | ICD-10-CM | POA: Diagnosis not present

## 2015-10-24 DIAGNOSIS — E785 Hyperlipidemia, unspecified: Secondary | ICD-10-CM | POA: Diagnosis not present

## 2015-10-24 DIAGNOSIS — Z7984 Long term (current) use of oral hypoglycemic drugs: Secondary | ICD-10-CM | POA: Diagnosis not present

## 2015-10-24 DIAGNOSIS — E1140 Type 2 diabetes mellitus with diabetic neuropathy, unspecified: Secondary | ICD-10-CM | POA: Diagnosis not present

## 2015-10-24 DIAGNOSIS — J984 Other disorders of lung: Secondary | ICD-10-CM | POA: Diagnosis not present

## 2015-10-24 DIAGNOSIS — M6281 Muscle weakness (generalized): Secondary | ICD-10-CM | POA: Diagnosis not present

## 2015-10-24 DIAGNOSIS — M159 Polyosteoarthritis, unspecified: Secondary | ICD-10-CM | POA: Diagnosis not present

## 2015-10-24 DIAGNOSIS — M069 Rheumatoid arthritis, unspecified: Secondary | ICD-10-CM | POA: Diagnosis not present

## 2015-10-24 DIAGNOSIS — R262 Difficulty in walking, not elsewhere classified: Secondary | ICD-10-CM | POA: Diagnosis not present

## 2015-10-24 DIAGNOSIS — Z7401 Bed confinement status: Secondary | ICD-10-CM | POA: Diagnosis not present

## 2015-10-24 LAB — CBC WITH DIFFERENTIAL/PLATELET
Basophils Absolute: 0 10*3/uL (ref 0–0.1)
EOS ABS: 0.1 10*3/uL (ref 0–0.7)
HCT: 36.7 % (ref 35.0–47.0)
HEMOGLOBIN: 11.9 g/dL — AB (ref 12.0–16.0)
Lymphocytes Relative: 20 %
Lymphs Abs: 2.3 10*3/uL (ref 1.0–3.6)
MCH: 29.9 pg (ref 26.0–34.0)
MCHC: 32.5 g/dL (ref 32.0–36.0)
MCV: 92 fL (ref 80.0–100.0)
Monocytes Absolute: 1.1 10*3/uL — ABNORMAL HIGH (ref 0.2–0.9)
Neutro Abs: 8 10*3/uL — ABNORMAL HIGH (ref 1.4–6.5)
PLATELETS: 299 10*3/uL (ref 150–440)
RBC: 3.99 MIL/uL (ref 3.80–5.20)
RDW: 14.2 % (ref 11.5–14.5)
WBC: 11.5 10*3/uL — AB (ref 3.6–11.0)

## 2015-10-24 LAB — BASIC METABOLIC PANEL
Anion gap: 6 (ref 5–15)
BUN: 12 mg/dL (ref 6–20)
CALCIUM: 8.1 mg/dL — AB (ref 8.9–10.3)
CHLORIDE: 105 mmol/L (ref 101–111)
CO2: 27 mmol/L (ref 22–32)
CREATININE: 0.74 mg/dL (ref 0.44–1.00)
Glucose, Bld: 151 mg/dL — ABNORMAL HIGH (ref 65–99)
Potassium: 4.3 mmol/L (ref 3.5–5.1)
SODIUM: 138 mmol/L (ref 135–145)

## 2015-10-24 LAB — GLUCOSE, CAPILLARY: GLUCOSE-CAPILLARY: 133 mg/dL — AB (ref 65–99)

## 2015-10-24 MED ORDER — TRAMADOL HCL 50 MG PO TABS: 50.0000 mg | ORAL_TABLET | Freq: Four times a day (QID) | ORAL | Status: DC | PRN

## 2015-10-24 MED ORDER — OXYCODONE HCL 5 MG PO TABS: 5.0000 mg | ORAL_TABLET | ORAL | Status: DC | PRN

## 2015-10-24 NOTE — Progress Notes (Signed)
Susan Pratt to be tranfered  Skilled nursing facility per MD order.  Discussed with the patient and all questions fully answered.  VSS, Skin clean, dry and intact without evidence of skin break down, no evidence of skin tears noted. IV catheter discontinued intact. Site without signs and symptoms of complications. Dressing and pressure applied.   Patient transferred by ED.  Susan Pratt 10/24/2015 2:00 PM

## 2015-10-24 NOTE — Progress Notes (Signed)
Patient resting in bed at this time. Pt complained of significant pain this shift. Due to pain uncontrolled sitting patient on side of the bed was delayed until 0000 med pass and pain better controlled. This was discussed with patient at 2130 and patient was in agreement. Patient tolerated dangle well and additional pain medication given at that time and assisted back in bed. Husband at bedside and chair had to be moved to get to the iv pole as well as the patient. When finished and stepped out of the room I heard husband yell out "i was asleep" "I don't know why you have to "F....." wake people up in the middle of the night". I went back into the room to check on them. Patient stated "we are ok".

## 2015-10-24 NOTE — Anesthesia Postprocedure Evaluation (Signed)
Anesthesia Post Note  Patient: Susan Pratt  Procedure(s) Performed: Procedure(s) (LRB): REVERSE SHOULDER ARTHROPLASTY (Left)  Patient location during evaluation: PACU Anesthesia Type: General Level of consciousness: awake and alert Pain management: pain level controlled Vital Signs Assessment: post-procedure vital signs reviewed and stable Respiratory status: spontaneous breathing, nonlabored ventilation, respiratory function stable and patient connected to nasal cannula oxygen Cardiovascular status: blood pressure returned to baseline and stable Postop Assessment: no signs of nausea or vomiting Anesthetic complications: no    Last Vitals:  Filed Vitals:   10/23/15 1928 10/24/15 0413  BP: 115/54 108/61  Pulse: 77 86  Temp: 36.4 C 37.3 C  Resp: 18 19    Last Pain:  Filed Vitals:   10/24/15 0543  PainSc: [place]

## 2015-10-24 NOTE — Clinical Social Work Note (Signed)
Clinical Social Work Assessment  Patient Details  Name: Susan Pratt MRN: [id number] Date of Birth: 11-03-1940  Date of referral:  10/24/15               Reason for consult:  Facility Placement                Permission sought to share information with:  Family Supports Permission granted to share information::  Yes, Verbal Permission Granted  Name::     Erandi Lemma   Housing/Transportation Living arrangements for the past 2 months:  [place] of Information:  Patient, Spouse Patient Interpreter Needed:  None Criminal Activity/Legal Involvement Pertinent to Current Situation/Hospitalization:  No - Comment as needed Significant Relationships:  Spouse Lives with:  Spouse Do you feel safe going back to the place where you live?  No (Pt needs a STR at SNF. ) Need for family participation in patient care:  No (Coment)  Care giving concerns:  No care giving on concerns   Social Worker assessment / plan:  CSW met with pt and husband to address consult. PT is recommending SNF. CSW introduced herself and explained role of social work. CSW also explained the process of discharging to SNF. Pt would like to go to [facility] Facility is able to accept pt today. CSW sent all discharge information to facility. RN to call report and EMS will provide transportation. CSW is signing off as no further needs identified.   Employment status:  Retired Nurse, adult PT Recommendations:  [place] / Referral to community resources:  [place]  Patient/Family's Response to care:  Pt and family were appreciative of CSW support.   Patient/Family's Understanding of and Emotional Response to Diagnosis, Current Treatment, and Prognosis:  Pt understands that she will need STR prior to returning home.   Emotional Assessment Appearance:  Appears stated age Attitude/Demeanor/Rapport:  Other (Appropriate) Affect  (typically observed):  Accepting Orientation:  Oriented to Self, Oriented to Place, Oriented to  Time, Oriented to Situation Alcohol / Substance use:  Never Used Psych involvement (Current and /or in the community):  No (Comment)  Discharge Needs  Concerns to be addressed:  Denies Needs/Concerns at this time Readmission within the last 30 days:  No Current discharge risk:  None Barriers to Discharge:  No Barriers Identified   Darden Dates, LCSW 10/24/2015, 11:59 AM

## 2015-10-24 NOTE — Clinical Social Work Note (Signed)
CSW consulted for New SNF. Pt is ready for discharge today. Per PT and RNCM, pt and family have declined SNF placement in favor of returning home with home health. RNCM following for discharge planning needs. CSW is signing off as no further needs identified.   Darden Dates, MSW, LCSW Clinical Social Worker  [phone]

## 2015-10-24 NOTE — Discharge Instructions (Signed)
INSTRUCTIONS AFTER Surgery  o Remove items at home which could result in a fall. This includes throw rugs or furniture in walking pathways o ICE to the affected joint every three hours while awake for 30 minutes at a time, for at least the first 3-5 days, and then as needed for pain and swelling.  Continue to use ice for pain and swelling. You may notice swelling that will progress down to the foot and ankle.  This is normal after surgery.  Elevate your leg when you are not up walking on it.   o Continue to use the breathing machine you got in the hospital (incentive spirometer) which will help keep your temperature down.  It is common for your temperature to cycle up and down following surgery, especially at night when you are not up moving around and exerting yourself.  The breathing machine keeps your lungs expanded and your temperature down.   DIET:  As you were doing prior to hospitalization, we recommend a well-balanced diet.  DRESSING / WOUND CARE / SHOWERING  The bandage is a surgical bandage that is water proof. The patient is able to shower with the bandage in place. It can be changed when you return to the orthopedic clinic.  ACTIVITY  o Increase activity slowly as tolerated, but follow the weight bearing instructions below.   o No driving for 6 weeks or until further direction given by your physician.  You cannot drive while taking narcotics.  o No lifting or carrying greater than 10 lbs. until further directed by your surgeon. o Avoid periods of inactivity such as sitting longer than an hour when not asleep. This helps prevent blood clots.  o You may return to work once you are authorized by your doctor.     WEIGHT BEARING  Keep your shoulder and the shoulder immobilizer on the left.   EXERCISES The patient will do physical therapy at home and doing gentle wrist, elbow, and shoulder exercises with range of motion.  CONSTIPATION  Constipation is defined medically as fewer  than three stools per week and severe constipation as less than one stool per week.  Even if you have a regular bowel pattern at home, your normal regimen is likely to be disrupted due to multiple reasons following surgery.  Combination of anesthesia, postoperative narcotics, change in appetite and fluid intake all can affect your bowels.   YOU MUST use at least one of the following options; they are listed in order of increasing strength to get the job done.  They are all available over the counter, and you may need to use some, POSSIBLY even all of these options:    Drink plenty of fluids (prune juice may be helpful) and high fiber foods Colace 100 mg by mouth twice a day  Senokot for constipation as directed and as needed Dulcolax (bisacodyl), take with full glass of water  Miralax (polyethylene glycol) once or twice a day as needed.  If you have tried all these things and are unable to have a bowel movement in the first 3-4 days after surgery call either your surgeon or your primary doctor.    If you experience loose stools or diarrhea, hold the medications until you stool forms back up.  If your symptoms do not get better within 1 week or if they get worse, check with your doctor.  If you experience "the worst abdominal pain ever" or develop nausea or vomiting, please contact the office immediately for further recommendations  for treatment.   ITCHING:  If you experience itching with your medications, try taking only a single pain pill, or even half a pain pill at a time.  You can also use Benadryl over the counter for itching or also to help with sleep.   TED HOSE STOCKINGS:  Use stockings on both legs until for at least 2 weeks or as directed by physician office. They may be removed at night for sleeping.  MEDICATIONS:  See your medication summary on the After Visit Summary that nursing will review with you.  You may have some home medications which will be placed on hold until you complete  the course of blood thinner medication.  It is important for you to complete the blood thinner medication as prescribed.  PRECAUTIONS:  If you experience chest pain or shortness of breath - call 911 immediately for transfer to the hospital emergency department.   If you develop a fever greater that 101 F, purulent drainage from wound, increased redness or drainage from wound, foul odor from the wound/dressing, or calf pain - CONTACT YOUR SURGEON.                                                   FOLLOW-UP APPOINTMENTS:  If you do not already have a post-op appointment, please call the office for an appointment to be seen by your surgeon.  Guidelines for how soon to be seen are listed in your After Visit Summary, but are typically between 1-4 weeks after surgery.  OTHER INSTRUCTIONS:     MAKE SURE YOU:   Understand these instructions.   Get help right away if you are not doing well or get worse.    Thank you for letting us be a part of your medical care team.  It is a privilege we respect greatly.  We hope these instructions will help you stay on track for a fast and full recovery!

## 2015-10-24 NOTE — Discharge Summary (Addendum)
Physician Discharge Summary  Subjective: 1 Day Post-Op Procedure(s) (LRB): REVERSE SHOULDER ARTHROPLASTY (Left) Patient reports pain as moderate.   Patient seen in rounds with Dr. Roland Rack. Patient is well, and has had no acute complaints or problems Patient is ready to go Home with home health physical therapy.  Physician Discharge Summary  Patient ID: Susan Pratt MRN: [id number] DOB/AGE: May 20, 1941 75 y.o.  Admit date: 10/23/2015 Discharge date: 10/24/2015  Admission Diagnoses:  Discharge Diagnoses:  Active Problems:   Status post reverse total shoulder replacement   Discharged Condition: fair  Hospital Course: The patient is postop day 1 reverse total shoulder replacement done on the left shoulder by Dr. Roland Rack. The patient spent the first night working on pain control. She was able to sit up the edge of the bed. She did have some pain control with the medication. She will is going home with home health physical therapy in her shoulder immobilizer.  Treatments: surgery:  Procedure: Reverse left total shoulder arthroplasty.  Surgeon: Pascal Lux, MD  Assistant: Cameron Proud, PA-C  Anesthesia: General endotracheal with an interscalene block placed preoperatively by the anesthesiologist.  Findings: As above.  Complications: None  EBL: 600 cc  Fluids: 900 cc crystalloid  UOP: 200 cc  TT: None  Drains: None  Closure: Staples  Implants: All press-fit Bi[employer] Comprehensive system with a #12 mini-humeral stem, a 44 mm humeral tray with a standard insert, and a standard baseplate with a 36 mm glenosphere.  Discharge Exam: Blood pressure 108/61, pulse 86, temperature 99.1 F (37.3 C), temperature source Oral, resp. rate 19, height 5\' 9"  (1.753 m), weight 120.339 kg (265 lb 4.8 oz), SpO2 98 %.   Disposition:      Medication List    TAKE these medications        amLODipine 10 MG tablet  Commonly known as:  NORVASC  Take 1 tablet (10  mg total) by mouth daily.     aspirin EC 81 MG tablet  Take 81 mg by mouth daily.     atenolol 50 MG tablet  Commonly known as:  TENORMIN  Take by mouth.     BAYER CONTOUR NEXT TEST test strip  Generic drug:  glucose blood     glucose blood test strip  Use as instructed     cholecalciferol 1000 units tablet  Commonly known as:  VITAMIN D  Take 1,000 Units by mouth daily.     gabapentin 300 MG capsule  Commonly known as:  NEURONTIN  Take 2 capsules by mouth 3 (three) times daily.     meloxicam 15 MG tablet  Commonly known as:  MOBIC  TAKE 1 TABLEY BY MOUTH ONCE DAILY AS NEEDED FOR PAIN     metFORMIN 500 MG tablet  Commonly known as:  GLUCOPHAGE  Take by mouth daily with breakfast. If gluose more than 130 takes 0.5 pill     multivitamin capsule  Take 1 capsule by mouth daily.     nystatin cream  Commonly known as:  MYCOSTATIN  Apply topically.     oxybutynin 5 MG tablet  Commonly known as:  DITROPAN  Take 5 mg by mouth.     oxyCODONE 5 MG immediate release tablet  Commonly known as:  Oxy IR/ROXICODONE  Take 1-2 tablets (5-10 mg total) by mouth every 3 (three) hours as needed for breakthrough pain.     PROAIR HFA 108 (90 Base) MCG/ACT inhaler  Generic drug:  albuterol  Inhale into the lungs.  sulfamethoxazole-trimethoprim 800-160 MG tablet  Commonly known as:  BACTRIM DS,SEPTRA DS  Take 1 tablet by mouth 2 (two) times daily.     traMADol 50 MG tablet  Commonly known as:  ULTRAM  Reported on 07/04/2015     traMADol 50 MG tablet  Commonly known as:  ULTRAM  Take 1-2 tablets (50-100 mg total) by mouth every 6 (six) hours as needed for moderate pain.     triamcinolone cream 0.1 %  Commonly known as:  KENALOG  Apply topically.     vitamin B-12 1000 MCG tablet  Commonly known as:  CYANOCOBALAMIN  Take 1,000 mcg by mouth daily.         SignedPrescott Parma, TODD 10/24/2015, 5:51 AM   Objective: Vital signs in last 24 hours: Temp:  [96.3 F (35.7  C)-99.1 F (37.3 C)] 99.1 F (37.3 C) (05/26 0413) Pulse Rate:  [58-86] 86 (05/26 0413) Resp:  [10-19] 19 (05/26 0413) BP: (108-147)/(52-75) 108/61 mmHg (05/26 0413) SpO2:  [95 %-100 %] 98 % (05/26 0413) FiO2 (%):  [21 %] 21 % (05/25 1426) Weight:  [113.853 kg (251 lb)-120.339 kg (265 lb 4.8 oz)] 120.339 kg (265 lb 4.8 oz) (05/25 1445)  Intake/Output from previous day:  Intake/Output Summary (Last 24 hours) at 10/24/15 0551 Last data filed at 10/24/15 0421  Gross per 24 hour  Intake 3406.67 ml  Output   1775 ml  Net 1631.67 ml    Intake/Output this shift: Total I/O In: 1436.7 [P.O.:120; I.V.:1116.7; IV Piggyback:200] Out: 700 [Urine:700]  Labs: No results for input(s): HGB in the last 72 hours. No results for input(s): WBC, RBC, HCT, PLT in the last 72 hours. No results for input(s): NA, K, CL, CO2, BUN, CREATININE, GLUCOSE, CALCIUM in the last 72 hours. No results for input(s): LABPT, INR in the last 72 hours.  EXAM: General - Patient is Alert and Oriented Extremity - Neurovascular intact Sensation intact distally Incision - clean, dry, no drainage Motor Function -  the patient is in her shoulder immobilizer. She is able to move her fingers and has good sensation.  Assessment/Plan: 1 Day Post-Op Procedure(s) (LRB): REVERSE SHOULDER ARTHROPLASTY (Left) Procedure(s) (LRB): REVERSE SHOULDER ARTHROPLASTY (Left) Past Medical History  Diagnosis Date  . Hypertension   . Hyperlipidemia   . Cataract   . Diabetes mellitus without complication ([place])   . Arthritis     ra  . 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 Incontinence in female     urinary  . Cancer ([place])     cervical  . Gait difficulty     limited mobility due to nerve damage   uses walker  . PE (pulmonary embolism)     2015  . Bronchospastic airway disease    Active Problems:   Status post reverse total shoulder replacement  Estimated body mass index is 39.16 kg/(m^2) as calculated from the following:    Height as of this encounter: 5\' 9"  (1.753 m).   Weight as of this encounter: 120.339 kg (265 lb 4.8 oz). Advance diet Up with therapy D/C IV fluids Discharge home with home health Diet - Regular diet Follow up - in 2 weeks Activity - WBAT Disposition - Skilled nursing facility Condition Upon Discharge - Stable DVT Prophylaxis - TED hose  Reche Dixon, PA-C Orthopaedic Surgery 10/24/2015, 5:51 AM

## 2015-10-24 NOTE — Progress Notes (Signed)
BP 114/42 HR 79. BP meds scheduled this am. Dr Roland Rack paged to see if he wants to hold bp meds this am. Waiting on callback.

## 2015-10-24 NOTE — Progress Notes (Signed)
Spoke with Dr Roland Rack, hold BP meds this am. Continue to monitor.

## 2015-10-24 NOTE — Progress Notes (Signed)
Report given to Mateo Flow at [facility]. ED called for transport.

## 2015-10-24 NOTE — Care Management (Signed)
Todd PA called back and told us to page lance Pa. During that time Dr. Roland Rack called CSW back and he is aware that patient/husband agrees to SNF discharge now. Pending SNF orders for CSW. I have updated [facility]. No further RNCM needs.

## 2015-10-24 NOTE — NC FL2 (Signed)
[place] LEVEL OF CARE SCREENING TOOL     IDENTIFICATION  Patient Name: Susan Pratt Birthdate: 04/03/1941 Sex: female Admission Date (Current Location): 10/23/2015  [place] and [place] Number:  Engineering geologist and Address:  [facility], [address] [place], [place] [address]      Provider Number: [id number]  Attending Physician Name and Address:  Corky Mull, MD  Relative Name and Phone Number:       Current Level of Care: Hospital Recommended Level of Care: [place] Prior Approval Number:    Date Approved/Denied:   PASRR Number: [id number]  Discharge Plan: SNF    Current Diagnoses: Patient Active Problem List   Diagnosis Date Noted  . 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 Arthritis of shoulder region, degenerative 10/10/2015  . Rotator cuff arthropathy 10/10/2015  . Acute bronchospasm 08/22/2015  . Vitamin D deficiency 07/04/2015  . Vitamin B12 deficiency 07/04/2015  . Hx pulmonary embolism 07/04/2015  . Bronchospastic airway disease 06/20/2015  . Rheumatoid arthritis ([place]) 06/19/2015  . Obesity, Class II, BMI 35-39.9 06/19/2015  . At risk for falling 06/18/2015  . Calcification of bronchus or trachea 01/24/2014  . Hx of cancer of endometrium 11/09/2012  . Hyperlipidemia 11/09/2012  . Incomplete bladder emptying 02/01/2012  . Mixed incontinence 02/01/2012  . Type 2 diabetes, controlled, with neuropathy ([place]) 01/12/2011  . Generalized OA 01/12/2011  . Hypertension 01/12/2011  . Back ache 01/12/2011  . Peripheral nerve disease ([place]) 01/12/2011    Orientation RESPIRATION BLADDER Height & Weight     Self, Time, Situation, Place  Normal Continent Weight: 265 lb 4.8 oz (120.339 kg) Height:  5\' 9"  (175.3 cm)  BEHAVIORAL SYMPTOMS/MOOD NEUROLOGICAL BOWEL NUTRITION STATUS      Continent Diet (Carb Modified, Thin)  AMBULATORY STATUS COMMUNICATION OF NEEDS Skin   Extensive  Assist Verbally Normal                       Personal Care Assistance Level of Assistance  Bathing, Feeding, Dressing Bathing Assistance: Limited assistance Feeding assistance: Limited assistance Dressing Assistance: Limited assistance     Functional Limitations Info  Sight, Hearing, Speech Sight Info: Adequate Hearing Info: Adequate Speech Info: Adequate    SPECIAL CARE FACTORS FREQUENCY                       Contractures      Additional Factors Info  Code Status, Allergies Code Status Info: Full Code Allergies Info: Lisinopril           Current Medications (10/24/2015):  This is the current hospital active medication list Current Facility-Administered Medications  Medication Dose Route Frequency Provider Last Rate Last Dose  . acetaminophen (TYLENOL) tablet 650 mg  650 mg Oral Q6H PRN Corky Mull, MD       Or  . acetaminophen (TYLENOL) suppository 650 mg  650 mg Rectal Q6H PRN Corky Mull, MD      . acetaminophen (TYLENOL) tablet 1,000 mg  1,000 mg Oral Q6H Corky Mull, MD   1,000 mg at 10/24/15 [id number]  . albuterol (PROVENTIL) (2.5 MG/3ML) 0.083% nebulizer solution 2.5 mg  2.5 mg Nebulization Q4H PRN Corky Mull, MD      . amLODipine (NORVASC) tablet 10 mg  10 mg Oral Daily Corky Mull, MD   Stopped at 10/24/15 0818  . aspirin EC tablet 81 mg  81 mg  Oral Daily Corky Mull, MD   81 mg at 10/24/15 0818  . atenolol (TENORMIN) tablet 50 mg  50 mg Oral Daily Corky Mull, MD   Stopped at 10/24/15 [phone]  . bisacodyl (DULCOLAX) suppository 10 mg  10 mg Rectal Daily PRN Corky Mull, MD      . cholecalciferol (VITAMIN D) tablet 1,000 Units  1,000 Units Oral Daily Corky Mull, MD   1,000 Units at 10/24/15 [phone]  . diphenhydrAMINE (BENADRYL) 12.5 MG/5ML elixir 12.5-25 mg  12.5-25 mg Oral Q4H PRN Corky Mull, MD      . docusate sodium (COLACE) capsule 100 mg  100 mg Oral BID Corky Mull, MD   100 mg at 10/24/15 0815  . enoxaparin (LOVENOX) injection 40 mg  40 mg  Subcutaneous Q24H Corky Mull, MD   40 mg at 10/24/15 0815  . gabapentin (NEURONTIN) capsule 600 mg  600 mg Oral TID Corky Mull, MD   600 mg at 10/24/15 0815  . HYDROmorphone (DILAUDID) injection 1-2 mg  1-2 mg Intravenous Q2H PRN Corky Mull, MD   1 mg at 10/23/15 1855  . magnesium hydroxide (MILK OF MAGNESIA) suspension 30 mL  30 mL Oral Daily PRN Corky Mull, MD      . metFORMIN (GLUCOPHAGE) tablet 500 mg  500 mg Oral Q breakfast Corky Mull, MD   500 mg at 10/24/15 0817  . methocarbamol (ROBAXIN) tablet 500 mg  500 mg Oral Q8H PRN Lattie Corns, PA-C   500 mg at 10/24/15 0052  . metoCLOPramide (REGLAN) tablet 5-10 mg  5-10 mg Oral Q8H PRN Corky Mull, MD       Or  . metoCLOPramide (REGLAN) injection 5-10 mg  5-10 mg Intravenous Q8H PRN Corky Mull, MD      . multivitamin with minerals tablet 1 tablet  1 tablet Oral Daily Corky Mull, MD   1 tablet at 10/24/15 0815  . ondansetron (ZOFRAN) tablet 4 mg  4 mg Oral Q6H PRN Corky Mull, MD       Or  . ondansetron [facility]) injection 4 mg  4 mg Intravenous Q6H PRN Corky Mull, MD      . oxybutynin (DITROPAN) tablet 5 mg  5 mg Oral TID Corky Mull, MD   5 mg at 10/24/15 0817  . oxyCODONE (Oxy IR/ROXICODONE) immediate release tablet 5-10 mg  5-10 mg Oral Q3H PRN Corky Mull, MD   5 mg at 10/24/15 1002  . pantoprazole (PROTONIX) EC tablet 40 mg  40 mg Oral BID Corky Mull, MD   40 mg at 10/24/15 0816  . sodium phosphate (FLEET) 7-19 GM/118ML enema 1 enema  1 enema Rectal Once PRN Corky Mull, MD      . sulfamethoxazole-trimethoprim (BACTRIM DS,SEPTRA DS) 800-160 MG per tablet 1 tablet  1 tablet Oral BID Corky Mull, MD   1 tablet at 10/24/15 0817  . traMADol (ULTRAM) tablet 50-100 mg  50-100 mg Oral Q6H PRN Corky Mull, MD   50 mg at 10/23/15 2250  . vitamin B-12 (CYANOCOBALAMIN) tablet 1,000 mcg  1,000 mcg Oral Daily Corky Mull, MD   1,000 mcg at 10/24/15 [id number]     Discharge Medications: Please see discharge summary for a  list of discharge medications.  Relevant Imaging Results:  Relevant Lab Results:   Additional Information SSN:  [id number]  Darden Dates, LCSW

## 2015-10-24 NOTE — Evaluation (Signed)
Occupational Therapy Evaluation Patient Details Name: Susan Pratt MRN: [id number] DOB: [birth year] Today's Date: 10/24/2015    History of Present Illness Pt is 75year old woman s/p L reverse total shoulder by Dr Roland Rack and is post op day 1 today.  The patient spent the first night working on pain control. She was able to sit up the edge of the bed. She did have some pain control with the medication. Shoulder immobilizer on at all times.    Clinical Impression    Patient was evaluated this date by OT, she lives with her husband. Patient needed assistance for LB dressing from her husband prior to admission with limited mobility per pt report. Patient has orders for LUE immobilized per Dr Roland Rack. Patient presents with decreased strength, decreased ability to perform self care tasks and will need further instructions for managing daily self care tasks with use of one hand only.reviewed position of L shoulder in immobilizer and in bed to prevent any ER. Given written handout and adaptive equipment catalog to consider purchasing of a transfer tub bench to increase safety with bathing. Patient will benefit from skilled OT care to address these limitations and improve independence in daily tasks to return home with family. Patient would like to go [facility] at discharge and would benefit from continued OTservices.   Follow Up Recommendations  SNF    Equipment Recommendations  Tub/shower bench    Recommendations for Other Services       Precautions / Restrictions Precautions Precautions: Fall;Shoulder Shoulder Interventions: Shoulder sling/immobilizer Required Braces or Orthoses: Sling Restrictions Weight Bearing Restrictions: Yes LUE Weight Bearing: Non weight bearing      Mobility Bed Mobility                  Transfers                      Balance                                            ADL Overall ADL's : Needs assistance/impaired                                        General ADL Comments: Pt requires min assist for det up for grooming and eating due to no use of LUE, max assist for UB and LB dressing skills due to pain and decreased trunk flexion and limited endurance.       Vision     Perception     Praxis      Pertinent Vitals/Pain Pain Assessment: 0-10 Pain Score: 10-Worst pain ever Pain Descriptors / Indicators: Aching;Constant;Nagging;Sore Pain Intervention(s): Limited activity within patient's tolerance;Monitored during session;Premedicated before session;Ice applied     Hand Dominance Right   Extremity/Trunk Assessment Upper Extremity Assessment Upper Extremity Assessment: LUE deficits/detail LUE Deficits / Details: shoulder immobilizer on at all times except when bathing and given written instructions for shoulder and positioning to prevent any ER and to use pillows in bed for support. Good movement in L hand but stated even opening and closing hand hurts her shoullder.           Communication Communication Communication: No difficulties   Cognition Arousal/Alertness: Awake/alert Behavior During Therapy: WFL for tasks assessed/performed Overall Cognitive Status: Within  Functional Limits for tasks assessed                     General Comments       Exercises       Shoulder Instructions      Home Living Family/patient expects to be discharged to:: Private residence Living Arrangements: Spouse/significant other Available Help at Discharge: Family Type of Home: House             Bathroom Shower/Tub: Tub/shower unit Shower/tub characteristics: Architectural technologist: Handicapped height (has BSC over toilet) Bathroom Accessibility: Yes How Accessible: Accessible via wheelchair Home Equipment: Bedside commode;Grab bars - toilet;Shower seat   Additional Comments: rec a transfer tub bench vs a shower chair at home      Prior Functioning/Environment Level  of Independence: Needs assistance    ADL's / Homemaking Assistance Needed: Pt's huband was assisting with getting shoes on and sometimes socks if pain was bad.  Pt reports she was not walking very much at all.          OT Diagnosis: Generalized weakness;Acute pain   OT Problem List: Decreased strength;Decreased range of motion;Decreased activity tolerance;Pain   OT Treatment/Interventions: Self-care/ADL training;Patient/family education    OT Goals(Current goals can be found in the care plan section) Acute Rehab OT Goals Patient Stated Goal: "to go to [facility]" OT Goal Formulation: With patient/family Time For Goal Achievement: 10/24/15 Potential to Achieve Goals: Good  OT Frequency: Min 1X/week   Barriers to D/C:            Co-evaluation              End of Session    Activity Tolerance: Patient limited by pain Patient left: in chair;with call bell/phone within reach;with chair alarm set;with family/visitor present   Time: [id number] OT Time Calculation (min): 32 min Charges:  OT General Charges $OT Visit: 1 Procedure OT Evaluation $OT Eval Moderate Complexity: 1 Procedure OT Treatments $Self Care/Home Management : 8-22 mins G-Codes:     Susan Pratt, OTR/L ascom [phone]  Pratt,Susan 10/24/2015, 11:29 AM

## 2015-10-24 NOTE — Evaluation (Signed)
Physical Therapy Evaluation Patient Details Name: Susan Pratt MRN: [id number] DOB: [birth year] Today's Date: 10/24/2015   History of Present Illness  Pt is 75year old woman s/p L reverse total shoulder by Dr Roland Rack (10/23/15).  The patient spent the first night working on pain control. She was able to sit up the edge of the bed. She did have some pain control with medication. Shoulder immobilizer on at all times.   Clinical Impression  Pt having a lot of pain post L total shoulder.  She and her husband were initially adamant about going home but after seeing her difficulty with simple transfer to recliner agreed that going home would be unsafe.  Pt with L knee buckling (apparently this started relatively recently) and did not do well with standing/WBing during bed to recliner transfer.  Pt able to participate with ~10 minutes of exercises/education apart from the exam but is functionally very limited. Pt shows good effort (and does well getting to EOB with heavy rail use) but ultimately is not safe.   Follow Up Recommendations SNF    Equipment Recommendations   (hemiwalker)    Recommendations for Other Services       Precautions / Restrictions Precautions Precautions: Fall;Shoulder Shoulder Interventions: Shoulder sling/immobilizer Required Braces or Orthoses: Sling Restrictions Weight Bearing Restrictions: Yes LUE Weight Bearing: Non weight bearing      Mobility  Bed Mobility Overal bed mobility: Needs Assistance Bed Mobility: Supine to Sit     Supine to sit: Min assist;Mod assist     General bed mobility comments: heavy use of rail to assist to EOB, pt shows great effort but does need some assist  Transfers Overall transfer level: Needs assistance Equipment used: Hemi-walker Transfers: Sit to/from Stand Sit to Stand: Mod assist;Min assist         General transfer comment: Pt again shows good effort but struggles to get to standing, especially transition involving  balance of R UE from bed rail to AD  Ambulation/Gait             General Gait Details: Pt is unable to do any true ambulation.  She was able to drag/shuffle L LE to the side slightly, and shift hips toward the recliner, but with attempts at putting weight on the L LE her knee buckled and she needed assist to safely land on the recliner.   Stairs            Wheelchair Mobility    Modified Rankin (Stroke Patients Only)       Balance Overall balance assessment: Needs assistance   Sitting balance-Leahy Scale: Fair       Standing balance-Leahy Scale: Poor                               Pertinent Vitals/Pain Pain Assessment: 0-10 Pain Score: 10-Worst pain ever Pain Location: L shoulder Pain Descriptors / Indicators: Aching;Constant;Nagging;Sore Pain Intervention(s): Limited activity within patient's tolerance;Monitored during session;Premedicated before session;Ice applied    Home Living Family/patient expects to be discharged to:: Private residence Living Arrangements: Spouse/significant other Available Help at Discharge: Family Type of Home: House Home Access: Stairs to enter;Ramped entrance;Level entry (apparently she usually does the 4 steps with 2 rails)       Home Equipment: Bedside commode;Grab bars - toilet;Shower seat Additional Comments: rec a transfer tub bench vs a shower chair at home    Prior Function Level of Independence: Needs assistance  Gait / Transfers Assistance Needed: apparently she was very limited with ambulation but could get out of the house some (stairs, in home distances)   ADL's / Homemaking Assistance Needed: Pt's huband was assisting with getting shoes on and sometimes socks if pain was bad.  Pt reports she was not walking very much at all.    Comments: recently pt has started having weakness/buckling in the L knee     Hand Dominance   Dominant Hand: Right    Extremity/Trunk Assessment   Upper Extremity  Assessment: LUE deficits/detail       LUE Deficits / Details: shoulder immobilizer on at all times except when bathing and given written instructions for shoulder and positioning to prevent any ER and to use pillows in bed for support. Good movement in L hand but stated even opening and closing hand hurts her shoullder.   Lower Extremity Assessment: Generalized weakness (grossly 4/5 on the R, 4-/5 on the L)         Communication   Communication: No difficulties  Cognition Arousal/Alertness: Awake/alert Behavior During Therapy: WFL for tasks assessed/performed Overall Cognitive Status: Within Functional Limits for tasks assessed                      General Comments      Exercises        Assessment/Plan    PT Assessment Patient needs continued PT services  PT Diagnosis Difficulty walking;Generalized weakness;Acute pain   PT Problem List Decreased strength;Decreased range of motion;Decreased activity tolerance;Decreased balance;Decreased mobility;Decreased knowledge of use of DME;Decreased safety awareness;Pain  PT Treatment Interventions DME instruction;Gait training;Functional mobility training;Therapeutic activities;Therapeutic exercise;Balance training   PT Goals (Current goals can be found in the Care Plan section) Acute Rehab PT Goals Patient Stated Goal: "to go to [facility] PT Goal Formulation: With patient/family Time For Goal Achievement: 11/07/15 Potential to Achieve Goals: Fair    Frequency BID   Barriers to discharge        Co-evaluation               End of Session Equipment Utilized During Treatment: Gait belt Activity Tolerance: Patient limited by fatigue;Patient limited by pain Patient left: with call bell/phone within reach;with chair alarm set;with family/visitor present Nurse Communication: Mobility status         Time: 0930-1010 PT Time Calculation (min) (ACUTE ONLY): 40 min   Charges:   PT Evaluation $PT Eval Moderate  Complexity: 1 Procedure PT Treatments $Therapeutic Exercise: 8-22 mins   PT G Codes:       Wayne Both, PT, DPT [phone]   Kreg Shropshire 10/24/2015, 12:03 PM

## 2015-10-24 NOTE — Care Management Note (Signed)
Case Management Note  Patient Details  Name: Susan Pratt MRN: [id number] Date of Birth: August 31, 1940  Subjective/Objective:                  Spoke with patient's husband and he insists that patient remain in the hospital "one more night". Both have declined SNF but agree to HHPT and picked [facility] home [facility]. She is dependent on a wheelchair for the most part with limited mobility at baseline with a walker per husband (over last 6 years).   Action/Plan: List of home health agencies provide. Referral made to [facility]. RNCM will continue to follow.   Expected Discharge Date:                  Expected Discharge Plan:     In-House Referral:     Discharge planning Services     Post Acute Care Choice:    Choice offered to:  Patient, Spouse  DME Arranged:    DME Agency:     HH Arranged:  PT HH Agency:  [place]  Status of Service:  In process, will continue to follow  Medicare Important Message Given:    Date Medicare IM Given:    Medicare IM give by:    Date Additional Medicare IM Given:    Additional Medicare Important Message give by:     If discussed at [place] of Stay Meetings, dates discussed:    Additional Comments:  Marshell Garfinkel, RN 10/24/2015, 8:26 AM

## 2015-10-24 NOTE — Clinical Social Work Placement (Signed)
   CLINICAL SOCIAL WORK PLACEMENT  NOTE  Date:  10/24/2015  Patient Details  Name: Susan Pratt MRN: [id number] Date of Birth: 10-Aug-1940  Clinical Social Work is seeking post-discharge placement for this patient at the [facility] level of care (*CSW will initial, date and re-position this form in  chart as items are completed):  Yes   Patient/family provided with [facility] Work Department's list of facilities offering this level of care within the geographic area requested by the patient (or if unable, by the patient's family).  Yes   Patient/family informed of their freedom to choose among providers that offer the needed level of care, that participate in Medicare, Medicaid or managed care program needed by the patient, have an available bed and are willing to accept the patient.  Yes   Patient/family informed of [facility]'s ownership interest in [facility] and [facility], as well as of the fact that they are under no obligation to receive care at these facilities.  PASRR submitted to EDS on       PASRR number received on       Existing PASRR number confirmed on 10/24/15     FL2 transmitted to all facilities in geographic area requested by pt/family on 10/24/15     FL2 transmitted to all facilities within larger geographic area on       Patient informed that his/her managed care company has contracts with or will negotiate with certain facilities, including the following:        Yes   Patient/family informed of bed offers received.  Patient chooses bed at [facility]     Physician recommends and patient chooses bed at  [facility])    Patient to be transferred to [facility] on 10/24/15.  Patient to be transferred to facility by [facility]     Patient family notified on 10/24/15 of transfer.  Name of family member notified:  Ellen Henri     PHYSICIAN       Additional Comment:     _______________________________________________ Darden Dates, LCSW 10/24/2015, 11:46 AM

## 2015-10-24 NOTE — Progress Notes (Signed)
  Subjective: 1 Day Post-Op Procedure(s) (LRB): REVERSE SHOULDER ARTHROPLASTY (Left) Patient reports pain as moderate.   Patient seen in rounds with Dr. Roland Rack. Patient is well, and has had no acute complaints or problems.  Still dealing with pain control. Plan is to go Home after hospital stay. Negative for chest pain and shortness of breath Fever: no Gastrointestinal:negative for nausea and vomiting The patient is in her shoulder immobilizer.  Objective: Vital signs in last 24 hours: Temp:  [96.3 F (35.7 C)-99.1 F (37.3 C)] 99.1 F (37.3 C) (05/26 0413) Pulse Rate:  [58-86] 86 (05/26 0413) Resp:  [10-19] 19 (05/26 0413) BP: (108-147)/(52-75) 108/61 mmHg (05/26 0413) SpO2:  [95 %-100 %] 98 % (05/26 0413) FiO2 (%):  [21 %] 21 % (05/25 1426) Weight:  [113.853 kg (251 lb)-120.339 kg (265 lb 4.8 oz)] 120.339 kg (265 lb 4.8 oz) (05/25 1445)  Intake/Output from previous day:  Intake/Output Summary (Last 24 hours) at 10/24/15 0545 Last data filed at 10/24/15 0421  Gross per 24 hour  Intake 3406.67 ml  Output   1775 ml  Net 1631.67 ml    Intake/Output this shift: Total I/O In: 1436.7 [P.O.:120; I.V.:1116.7; IV Piggyback:200] Out: 700 [Urine:700]  Labs: No results for input(s): HGB in the last 72 hours. No results for input(s): WBC, RBC, HCT, PLT in the last 72 hours. No results for input(s): NA, K, CL, CO2, BUN, CREATININE, GLUCOSE, CALCIUM in the last 72 hours. No results for input(s): LABPT, INR in the last 72 hours.   EXAM General - Patient is Alert and Oriented Extremity - Neurovascular intact Dressing/Incision - clean, dry, no drainage Motor Function - intact, moving fingers, feet and toes well on exam.   Past Medical History  Diagnosis Date  . Hypertension   . Hyperlipidemia   . Cataract   . Diabetes mellitus without complication ([place])   . Arthritis     ra  . 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 Incontinence in female     urinary  . Cancer ([place])    cervical  . Gait difficulty     limited mobility due to nerve damage   uses walker  . PE (pulmonary embolism)     2015  . Bronchospastic airway disease     Assessment/Plan: 1 Day Post-Op Procedure(s) (LRB): REVERSE SHOULDER ARTHROPLASTY (Left) Active Problems:   Status post reverse total shoulder replacement  Estimated body mass index is 39.16 kg/(m^2) as calculated from the following:   Height as of this encounter: 5\' 9"  (1.753 m).   Weight as of this encounter: 120.339 kg (265 lb 4.8 oz). Advance diet Up with therapy D/C IV fluids Discharge home with home health  DVT Prophylaxis - Lovenox and TED hose Shoulder immobilizer to be left in place.  Reche Dixon, PA-C Orthopaedic Surgery 10/24/2015, 5:45 AM \

## 2015-10-24 NOTE — Progress Notes (Signed)
Spoke with Bishop Limbo, [employer] rep at [phone], to notify of non-emergent EMS transport.  Auth notification reference given as [id number].   Service date range good from 10/24/15 - 01/22/16.   Gap exception requested to determine if services can be considered at an in-network level.

## 2015-10-25 DIAGNOSIS — E119 Type 2 diabetes mellitus without complications: Secondary | ICD-10-CM | POA: Diagnosis not present

## 2015-10-25 LAB — GLUCOSE, CAPILLARY
GLUCOSE-CAPILLARY: 175 mg/dL — AB (ref 65–99)
GLUCOSE-CAPILLARY: 192 mg/dL — AB (ref 65–99)
Glucose-Capillary: 214 mg/dL — ABNORMAL HIGH (ref 65–99)

## 2015-10-26 DIAGNOSIS — E119 Type 2 diabetes mellitus without complications: Secondary | ICD-10-CM | POA: Diagnosis not present

## 2015-10-26 LAB — GLUCOSE, CAPILLARY
GLUCOSE-CAPILLARY: 244 mg/dL — AB (ref 65–99)
GLUCOSE-CAPILLARY: 165 mg/dL — AB (ref 65–99)
Glucose-Capillary: 164 mg/dL — ABNORMAL HIGH (ref 65–99)

## 2015-10-27 DIAGNOSIS — E119 Type 2 diabetes mellitus without complications: Secondary | ICD-10-CM | POA: Diagnosis not present

## 2015-10-27 LAB — GLUCOSE, CAPILLARY
GLUCOSE-CAPILLARY: 131 mg/dL — AB (ref 65–99)
GLUCOSE-CAPILLARY: 129 mg/dL — AB (ref 65–99)
Glucose-Capillary: 130 mg/dL — ABNORMAL HIGH (ref 65–99)
Glucose-Capillary: 114 mg/dL — ABNORMAL HIGH (ref 65–99)

## 2015-10-28 DIAGNOSIS — I10 Essential (primary) hypertension: Secondary | ICD-10-CM | POA: Diagnosis not present

## 2015-10-28 DIAGNOSIS — R252 Cramp and spasm: Secondary | ICD-10-CM | POA: Diagnosis not present

## 2015-10-28 DIAGNOSIS — M159 Polyosteoarthritis, unspecified: Secondary | ICD-10-CM | POA: Diagnosis not present

## 2015-10-28 DIAGNOSIS — E119 Type 2 diabetes mellitus without complications: Secondary | ICD-10-CM | POA: Diagnosis not present

## 2015-10-28 LAB — GLUCOSE, CAPILLARY
GLUCOSE-CAPILLARY: 179 mg/dL — AB (ref 65–99)
GLUCOSE-CAPILLARY: 174 mg/dL — AB (ref 65–99)

## 2015-10-28 LAB — SURGICAL PATHOLOGY

## 2015-10-29 LAB — GLUCOSE, CAPILLARY
GLUCOSE-CAPILLARY: 199 mg/dL — AB (ref 65–99)
Glucose-Capillary: 139 mg/dL — ABNORMAL HIGH (ref 65–99)
Glucose-Capillary: 154 mg/dL — ABNORMAL HIGH (ref 65–99)
Glucose-Capillary: 188 mg/dL — ABNORMAL HIGH (ref 65–99)
Glucose-Capillary: 144 mg/dL — ABNORMAL HIGH (ref 65–99)
Glucose-Capillary: 154 mg/dL — ABNORMAL HIGH (ref 65–99)

## 2015-10-30 ENCOUNTER — Encounter
Admission: RE | Admit: 2015-10-30 | Discharge: 2015-10-30 | Disposition: A | Discharge status: Home / Self Care | Payer: Medicare Other | Source: Ambulatory Visit | Attending: Internal Medicine | Admitting: Internal Medicine

## 2015-10-30 DIAGNOSIS — E119 Type 2 diabetes mellitus without complications: Secondary | ICD-10-CM | POA: Insufficient documentation

## 2015-10-30 LAB — GLUCOSE, CAPILLARY: Glucose-Capillary: 168 mg/dL — ABNORMAL HIGH (ref 65–99)

## 2015-11-01 DIAGNOSIS — Z79891 Long term (current) use of opiate analgesic: Secondary | ICD-10-CM | POA: Diagnosis not present

## 2015-11-01 DIAGNOSIS — E1142 Type 2 diabetes mellitus with diabetic polyneuropathy: Secondary | ICD-10-CM | POA: Diagnosis not present

## 2015-11-01 DIAGNOSIS — M069 Rheumatoid arthritis, unspecified: Secondary | ICD-10-CM | POA: Diagnosis not present

## 2015-11-01 DIAGNOSIS — Z7982 Long term (current) use of aspirin: Secondary | ICD-10-CM | POA: Diagnosis not present

## 2015-11-01 DIAGNOSIS — I10 Essential (primary) hypertension: Secondary | ICD-10-CM | POA: Diagnosis not present

## 2015-11-01 DIAGNOSIS — E785 Hyperlipidemia, unspecified: Secondary | ICD-10-CM | POA: Diagnosis not present

## 2015-11-01 DIAGNOSIS — Z86711 Personal history of pulmonary embolism: Secondary | ICD-10-CM | POA: Diagnosis not present

## 2015-11-01 DIAGNOSIS — Z96612 Presence of left artificial shoulder joint: Secondary | ICD-10-CM | POA: Diagnosis not present

## 2015-11-01 DIAGNOSIS — J988 Other specified respiratory disorders: Secondary | ICD-10-CM | POA: Diagnosis not present

## 2015-11-01 DIAGNOSIS — Z471 Aftercare following joint replacement surgery: Secondary | ICD-10-CM | POA: Diagnosis not present

## 2015-11-01 DIAGNOSIS — Z7984 Long term (current) use of oral hypoglycemic drugs: Secondary | ICD-10-CM | POA: Diagnosis not present

## 2015-11-01 DIAGNOSIS — E1136 Type 2 diabetes mellitus with diabetic cataract: Secondary | ICD-10-CM | POA: Diagnosis not present

## 2015-11-04 DIAGNOSIS — Z86711 Personal history of pulmonary embolism: Secondary | ICD-10-CM | POA: Diagnosis not present

## 2015-11-04 DIAGNOSIS — Z79891 Long term (current) use of opiate analgesic: Secondary | ICD-10-CM | POA: Diagnosis not present

## 2015-11-04 DIAGNOSIS — E1136 Type 2 diabetes mellitus with diabetic cataract: Secondary | ICD-10-CM | POA: Diagnosis not present

## 2015-11-04 DIAGNOSIS — M069 Rheumatoid arthritis, unspecified: Secondary | ICD-10-CM | POA: Diagnosis not present

## 2015-11-04 DIAGNOSIS — I10 Essential (primary) hypertension: Secondary | ICD-10-CM | POA: Diagnosis not present

## 2015-11-04 DIAGNOSIS — Z96612 Presence of left artificial shoulder joint: Secondary | ICD-10-CM | POA: Diagnosis not present

## 2015-11-04 DIAGNOSIS — Z7984 Long term (current) use of oral hypoglycemic drugs: Secondary | ICD-10-CM | POA: Diagnosis not present

## 2015-11-04 DIAGNOSIS — E1142 Type 2 diabetes mellitus with diabetic polyneuropathy: Secondary | ICD-10-CM | POA: Diagnosis not present

## 2015-11-04 DIAGNOSIS — E785 Hyperlipidemia, unspecified: Secondary | ICD-10-CM | POA: Diagnosis not present

## 2015-11-04 DIAGNOSIS — J988 Other specified respiratory disorders: Secondary | ICD-10-CM | POA: Diagnosis not present

## 2015-11-04 DIAGNOSIS — Z471 Aftercare following joint replacement surgery: Secondary | ICD-10-CM | POA: Diagnosis not present

## 2015-11-04 DIAGNOSIS — Z7982 Long term (current) use of aspirin: Secondary | ICD-10-CM | POA: Diagnosis not present

## 2015-11-05 DIAGNOSIS — E1136 Type 2 diabetes mellitus with diabetic cataract: Secondary | ICD-10-CM | POA: Diagnosis not present

## 2015-11-05 DIAGNOSIS — Z96612 Presence of left artificial shoulder joint: Secondary | ICD-10-CM | POA: Diagnosis not present

## 2015-11-05 DIAGNOSIS — I10 Essential (primary) hypertension: Secondary | ICD-10-CM | POA: Diagnosis not present

## 2015-11-05 DIAGNOSIS — Z471 Aftercare following joint replacement surgery: Secondary | ICD-10-CM | POA: Diagnosis not present

## 2015-11-05 DIAGNOSIS — Z86711 Personal history of pulmonary embolism: Secondary | ICD-10-CM | POA: Diagnosis not present

## 2015-11-05 DIAGNOSIS — E1142 Type 2 diabetes mellitus with diabetic polyneuropathy: Secondary | ICD-10-CM | POA: Diagnosis not present

## 2015-11-05 DIAGNOSIS — Z7984 Long term (current) use of oral hypoglycemic drugs: Secondary | ICD-10-CM | POA: Diagnosis not present

## 2015-11-05 DIAGNOSIS — M069 Rheumatoid arthritis, unspecified: Secondary | ICD-10-CM | POA: Diagnosis not present

## 2015-11-05 DIAGNOSIS — Z79891 Long term (current) use of opiate analgesic: Secondary | ICD-10-CM | POA: Diagnosis not present

## 2015-11-05 DIAGNOSIS — Z7982 Long term (current) use of aspirin: Secondary | ICD-10-CM | POA: Diagnosis not present

## 2015-11-05 DIAGNOSIS — J988 Other specified respiratory disorders: Secondary | ICD-10-CM | POA: Diagnosis not present

## 2015-11-05 DIAGNOSIS — E785 Hyperlipidemia, unspecified: Secondary | ICD-10-CM | POA: Diagnosis not present

## 2015-11-06 DIAGNOSIS — E1142 Type 2 diabetes mellitus with diabetic polyneuropathy: Secondary | ICD-10-CM | POA: Diagnosis not present

## 2015-11-06 DIAGNOSIS — E785 Hyperlipidemia, unspecified: Secondary | ICD-10-CM | POA: Diagnosis not present

## 2015-11-06 DIAGNOSIS — I10 Essential (primary) hypertension: Secondary | ICD-10-CM | POA: Diagnosis not present

## 2015-11-06 DIAGNOSIS — Z79891 Long term (current) use of opiate analgesic: Secondary | ICD-10-CM | POA: Diagnosis not present

## 2015-11-06 DIAGNOSIS — Z7984 Long term (current) use of oral hypoglycemic drugs: Secondary | ICD-10-CM | POA: Diagnosis not present

## 2015-11-06 DIAGNOSIS — Z86711 Personal history of pulmonary embolism: Secondary | ICD-10-CM | POA: Diagnosis not present

## 2015-11-06 DIAGNOSIS — Z96612 Presence of left artificial shoulder joint: Secondary | ICD-10-CM | POA: Diagnosis not present

## 2015-11-06 DIAGNOSIS — Z7982 Long term (current) use of aspirin: Secondary | ICD-10-CM | POA: Diagnosis not present

## 2015-11-06 DIAGNOSIS — E1136 Type 2 diabetes mellitus with diabetic cataract: Secondary | ICD-10-CM | POA: Diagnosis not present

## 2015-11-06 DIAGNOSIS — Z471 Aftercare following joint replacement surgery: Secondary | ICD-10-CM | POA: Diagnosis not present

## 2015-11-06 DIAGNOSIS — J988 Other specified respiratory disorders: Secondary | ICD-10-CM | POA: Diagnosis not present

## 2015-11-06 DIAGNOSIS — M069 Rheumatoid arthritis, unspecified: Secondary | ICD-10-CM | POA: Diagnosis not present

## 2015-11-10 DIAGNOSIS — I10 Essential (primary) hypertension: Secondary | ICD-10-CM | POA: Diagnosis not present

## 2015-11-10 DIAGNOSIS — Z79891 Long term (current) use of opiate analgesic: Secondary | ICD-10-CM | POA: Diagnosis not present

## 2015-11-10 DIAGNOSIS — E1142 Type 2 diabetes mellitus with diabetic polyneuropathy: Secondary | ICD-10-CM | POA: Diagnosis not present

## 2015-11-10 DIAGNOSIS — Z7984 Long term (current) use of oral hypoglycemic drugs: Secondary | ICD-10-CM | POA: Diagnosis not present

## 2015-11-10 DIAGNOSIS — Z86711 Personal history of pulmonary embolism: Secondary | ICD-10-CM | POA: Diagnosis not present

## 2015-11-10 DIAGNOSIS — Z7982 Long term (current) use of aspirin: Secondary | ICD-10-CM | POA: Diagnosis not present

## 2015-11-10 DIAGNOSIS — Z96612 Presence of left artificial shoulder joint: Secondary | ICD-10-CM | POA: Diagnosis not present

## 2015-11-10 DIAGNOSIS — E785 Hyperlipidemia, unspecified: Secondary | ICD-10-CM | POA: Diagnosis not present

## 2015-11-10 DIAGNOSIS — E1136 Type 2 diabetes mellitus with diabetic cataract: Secondary | ICD-10-CM | POA: Diagnosis not present

## 2015-11-10 DIAGNOSIS — M069 Rheumatoid arthritis, unspecified: Secondary | ICD-10-CM | POA: Diagnosis not present

## 2015-11-10 DIAGNOSIS — Z471 Aftercare following joint replacement surgery: Secondary | ICD-10-CM | POA: Diagnosis not present

## 2015-11-10 DIAGNOSIS — J988 Other specified respiratory disorders: Secondary | ICD-10-CM | POA: Diagnosis not present

## 2015-11-11 ENCOUNTER — Other Ambulatory Visit: Payer: Self-pay | Admitting: Family Medicine

## 2015-11-11 ENCOUNTER — Telehealth: Payer: Self-pay

## 2015-11-11 DIAGNOSIS — I10 Essential (primary) hypertension: Secondary | ICD-10-CM | POA: Diagnosis not present

## 2015-11-11 DIAGNOSIS — E1142 Type 2 diabetes mellitus with diabetic polyneuropathy: Secondary | ICD-10-CM | POA: Diagnosis not present

## 2015-11-11 DIAGNOSIS — Z7982 Long term (current) use of aspirin: Secondary | ICD-10-CM | POA: Diagnosis not present

## 2015-11-11 DIAGNOSIS — E1136 Type 2 diabetes mellitus with diabetic cataract: Secondary | ICD-10-CM | POA: Diagnosis not present

## 2015-11-11 DIAGNOSIS — Z471 Aftercare following joint replacement surgery: Secondary | ICD-10-CM | POA: Diagnosis not present

## 2015-11-11 DIAGNOSIS — Z7984 Long term (current) use of oral hypoglycemic drugs: Secondary | ICD-10-CM | POA: Diagnosis not present

## 2015-11-11 DIAGNOSIS — J988 Other specified respiratory disorders: Secondary | ICD-10-CM | POA: Diagnosis not present

## 2015-11-11 DIAGNOSIS — M069 Rheumatoid arthritis, unspecified: Secondary | ICD-10-CM | POA: Diagnosis not present

## 2015-11-11 DIAGNOSIS — Z79891 Long term (current) use of opiate analgesic: Secondary | ICD-10-CM | POA: Diagnosis not present

## 2015-11-11 DIAGNOSIS — Z86711 Personal history of pulmonary embolism: Secondary | ICD-10-CM | POA: Diagnosis not present

## 2015-11-11 DIAGNOSIS — Z96612 Presence of left artificial shoulder joint: Secondary | ICD-10-CM | POA: Diagnosis not present

## 2015-11-11 DIAGNOSIS — E785 Hyperlipidemia, unspecified: Secondary | ICD-10-CM | POA: Diagnosis not present

## 2015-11-11 MED ORDER — METFORMIN HCL 500 MG PO TABS: 500.0000 mg | ORAL_TABLET | Freq: Two times a day (BID) | ORAL | Status: DC

## 2015-11-11 MED ORDER — METFORMIN HCL 500 MG PO TABS: 500.0000 mg | ORAL_TABLET | Freq: Two times a day (BID) | ORAL | Status: AC

## 2015-11-11 NOTE — Telephone Encounter (Signed)
Should be on metformin 500 mg bid every day. Ignore 1/2 tablet directions.

## 2015-11-11 NOTE — Telephone Encounter (Signed)
Pharmacy called stating that patient sugars have been 130 and is this dose of going up to 1/2 tab daily correct or is it add 1/2 tab to each dose?

## 2015-11-12 DIAGNOSIS — Z7982 Long term (current) use of aspirin: Secondary | ICD-10-CM | POA: Diagnosis not present

## 2015-11-12 DIAGNOSIS — E785 Hyperlipidemia, unspecified: Secondary | ICD-10-CM | POA: Diagnosis not present

## 2015-11-12 DIAGNOSIS — J988 Other specified respiratory disorders: Secondary | ICD-10-CM | POA: Diagnosis not present

## 2015-11-12 DIAGNOSIS — E1136 Type 2 diabetes mellitus with diabetic cataract: Secondary | ICD-10-CM | POA: Diagnosis not present

## 2015-11-12 DIAGNOSIS — Z96612 Presence of left artificial shoulder joint: Secondary | ICD-10-CM | POA: Diagnosis not present

## 2015-11-12 DIAGNOSIS — I10 Essential (primary) hypertension: Secondary | ICD-10-CM | POA: Diagnosis not present

## 2015-11-12 DIAGNOSIS — E1142 Type 2 diabetes mellitus with diabetic polyneuropathy: Secondary | ICD-10-CM | POA: Diagnosis not present

## 2015-11-12 DIAGNOSIS — Z79891 Long term (current) use of opiate analgesic: Secondary | ICD-10-CM | POA: Diagnosis not present

## 2015-11-12 DIAGNOSIS — M069 Rheumatoid arthritis, unspecified: Secondary | ICD-10-CM | POA: Diagnosis not present

## 2015-11-12 DIAGNOSIS — Z7984 Long term (current) use of oral hypoglycemic drugs: Secondary | ICD-10-CM | POA: Diagnosis not present

## 2015-11-12 DIAGNOSIS — Z86711 Personal history of pulmonary embolism: Secondary | ICD-10-CM | POA: Diagnosis not present

## 2015-11-12 DIAGNOSIS — Z471 Aftercare following joint replacement surgery: Secondary | ICD-10-CM | POA: Diagnosis not present

## 2015-11-12 NOTE — Telephone Encounter (Signed)
Advised 

## 2015-11-13 DIAGNOSIS — E1136 Type 2 diabetes mellitus with diabetic cataract: Secondary | ICD-10-CM | POA: Diagnosis not present

## 2015-11-13 DIAGNOSIS — Z7984 Long term (current) use of oral hypoglycemic drugs: Secondary | ICD-10-CM | POA: Diagnosis not present

## 2015-11-13 DIAGNOSIS — Z79891 Long term (current) use of opiate analgesic: Secondary | ICD-10-CM | POA: Diagnosis not present

## 2015-11-13 DIAGNOSIS — Z96612 Presence of left artificial shoulder joint: Secondary | ICD-10-CM | POA: Diagnosis not present

## 2015-11-13 DIAGNOSIS — J988 Other specified respiratory disorders: Secondary | ICD-10-CM | POA: Diagnosis not present

## 2015-11-13 DIAGNOSIS — Z471 Aftercare following joint replacement surgery: Secondary | ICD-10-CM | POA: Diagnosis not present

## 2015-11-13 DIAGNOSIS — I10 Essential (primary) hypertension: Secondary | ICD-10-CM | POA: Diagnosis not present

## 2015-11-13 DIAGNOSIS — M069 Rheumatoid arthritis, unspecified: Secondary | ICD-10-CM | POA: Diagnosis not present

## 2015-11-13 DIAGNOSIS — Z86711 Personal history of pulmonary embolism: Secondary | ICD-10-CM | POA: Diagnosis not present

## 2015-11-13 DIAGNOSIS — Z7982 Long term (current) use of aspirin: Secondary | ICD-10-CM | POA: Diagnosis not present

## 2015-11-13 DIAGNOSIS — E785 Hyperlipidemia, unspecified: Secondary | ICD-10-CM | POA: Diagnosis not present

## 2015-11-13 DIAGNOSIS — E1142 Type 2 diabetes mellitus with diabetic polyneuropathy: Secondary | ICD-10-CM | POA: Diagnosis not present

## 2015-11-14 DIAGNOSIS — I10 Essential (primary) hypertension: Secondary | ICD-10-CM | POA: Diagnosis not present

## 2015-11-14 DIAGNOSIS — J988 Other specified respiratory disorders: Secondary | ICD-10-CM | POA: Diagnosis not present

## 2015-11-14 DIAGNOSIS — Z96612 Presence of left artificial shoulder joint: Secondary | ICD-10-CM | POA: Diagnosis not present

## 2015-11-14 DIAGNOSIS — Z86711 Personal history of pulmonary embolism: Secondary | ICD-10-CM | POA: Diagnosis not present

## 2015-11-14 DIAGNOSIS — Z79891 Long term (current) use of opiate analgesic: Secondary | ICD-10-CM | POA: Diagnosis not present

## 2015-11-14 DIAGNOSIS — Z471 Aftercare following joint replacement surgery: Secondary | ICD-10-CM | POA: Diagnosis not present

## 2015-11-14 DIAGNOSIS — E785 Hyperlipidemia, unspecified: Secondary | ICD-10-CM | POA: Diagnosis not present

## 2015-11-14 DIAGNOSIS — E1136 Type 2 diabetes mellitus with diabetic cataract: Secondary | ICD-10-CM | POA: Diagnosis not present

## 2015-11-14 DIAGNOSIS — Z7982 Long term (current) use of aspirin: Secondary | ICD-10-CM | POA: Diagnosis not present

## 2015-11-14 DIAGNOSIS — E1142 Type 2 diabetes mellitus with diabetic polyneuropathy: Secondary | ICD-10-CM | POA: Diagnosis not present

## 2015-11-14 DIAGNOSIS — M069 Rheumatoid arthritis, unspecified: Secondary | ICD-10-CM | POA: Diagnosis not present

## 2015-11-14 DIAGNOSIS — Z7984 Long term (current) use of oral hypoglycemic drugs: Secondary | ICD-10-CM | POA: Diagnosis not present

## 2015-11-18 DIAGNOSIS — Z7982 Long term (current) use of aspirin: Secondary | ICD-10-CM | POA: Diagnosis not present

## 2015-11-18 DIAGNOSIS — E1142 Type 2 diabetes mellitus with diabetic polyneuropathy: Secondary | ICD-10-CM | POA: Diagnosis not present

## 2015-11-18 DIAGNOSIS — I10 Essential (primary) hypertension: Secondary | ICD-10-CM | POA: Diagnosis not present

## 2015-11-18 DIAGNOSIS — J988 Other specified respiratory disorders: Secondary | ICD-10-CM | POA: Diagnosis not present

## 2015-11-18 DIAGNOSIS — E785 Hyperlipidemia, unspecified: Secondary | ICD-10-CM | POA: Diagnosis not present

## 2015-11-18 DIAGNOSIS — Z471 Aftercare following joint replacement surgery: Secondary | ICD-10-CM | POA: Diagnosis not present

## 2015-11-18 DIAGNOSIS — Z7984 Long term (current) use of oral hypoglycemic drugs: Secondary | ICD-10-CM | POA: Diagnosis not present

## 2015-11-18 DIAGNOSIS — Z96612 Presence of left artificial shoulder joint: Secondary | ICD-10-CM | POA: Diagnosis not present

## 2015-11-18 DIAGNOSIS — M069 Rheumatoid arthritis, unspecified: Secondary | ICD-10-CM | POA: Diagnosis not present

## 2015-11-18 DIAGNOSIS — Z79891 Long term (current) use of opiate analgesic: Secondary | ICD-10-CM | POA: Diagnosis not present

## 2015-11-18 DIAGNOSIS — Z86711 Personal history of pulmonary embolism: Secondary | ICD-10-CM | POA: Diagnosis not present

## 2015-11-18 DIAGNOSIS — E1136 Type 2 diabetes mellitus with diabetic cataract: Secondary | ICD-10-CM | POA: Diagnosis not present

## 2015-11-19 DIAGNOSIS — E785 Hyperlipidemia, unspecified: Secondary | ICD-10-CM | POA: Diagnosis not present

## 2015-11-19 DIAGNOSIS — Z7982 Long term (current) use of aspirin: Secondary | ICD-10-CM | POA: Diagnosis not present

## 2015-11-19 DIAGNOSIS — E1142 Type 2 diabetes mellitus with diabetic polyneuropathy: Secondary | ICD-10-CM | POA: Diagnosis not present

## 2015-11-19 DIAGNOSIS — J988 Other specified respiratory disorders: Secondary | ICD-10-CM | POA: Diagnosis not present

## 2015-11-19 DIAGNOSIS — Z96612 Presence of left artificial shoulder joint: Secondary | ICD-10-CM | POA: Diagnosis not present

## 2015-11-19 DIAGNOSIS — Z79891 Long term (current) use of opiate analgesic: Secondary | ICD-10-CM | POA: Diagnosis not present

## 2015-11-19 DIAGNOSIS — Z471 Aftercare following joint replacement surgery: Secondary | ICD-10-CM | POA: Diagnosis not present

## 2015-11-19 DIAGNOSIS — M069 Rheumatoid arthritis, unspecified: Secondary | ICD-10-CM | POA: Diagnosis not present

## 2015-11-19 DIAGNOSIS — E1136 Type 2 diabetes mellitus with diabetic cataract: Secondary | ICD-10-CM | POA: Diagnosis not present

## 2015-11-19 DIAGNOSIS — I10 Essential (primary) hypertension: Secondary | ICD-10-CM | POA: Diagnosis not present

## 2015-11-19 DIAGNOSIS — Z7984 Long term (current) use of oral hypoglycemic drugs: Secondary | ICD-10-CM | POA: Diagnosis not present

## 2015-11-19 DIAGNOSIS — Z86711 Personal history of pulmonary embolism: Secondary | ICD-10-CM | POA: Diagnosis not present

## 2015-11-20 DIAGNOSIS — Z7982 Long term (current) use of aspirin: Secondary | ICD-10-CM | POA: Diagnosis not present

## 2015-11-20 DIAGNOSIS — M069 Rheumatoid arthritis, unspecified: Secondary | ICD-10-CM | POA: Diagnosis not present

## 2015-11-20 DIAGNOSIS — Z471 Aftercare following joint replacement surgery: Secondary | ICD-10-CM | POA: Diagnosis not present

## 2015-11-20 DIAGNOSIS — Z86711 Personal history of pulmonary embolism: Secondary | ICD-10-CM | POA: Diagnosis not present

## 2015-11-20 DIAGNOSIS — Z79891 Long term (current) use of opiate analgesic: Secondary | ICD-10-CM | POA: Diagnosis not present

## 2015-11-20 DIAGNOSIS — E785 Hyperlipidemia, unspecified: Secondary | ICD-10-CM | POA: Diagnosis not present

## 2015-11-20 DIAGNOSIS — I10 Essential (primary) hypertension: Secondary | ICD-10-CM | POA: Diagnosis not present

## 2015-11-20 DIAGNOSIS — E1136 Type 2 diabetes mellitus with diabetic cataract: Secondary | ICD-10-CM | POA: Diagnosis not present

## 2015-11-20 DIAGNOSIS — Z7984 Long term (current) use of oral hypoglycemic drugs: Secondary | ICD-10-CM | POA: Diagnosis not present

## 2015-11-20 DIAGNOSIS — Z96612 Presence of left artificial shoulder joint: Secondary | ICD-10-CM | POA: Diagnosis not present

## 2015-11-20 DIAGNOSIS — J988 Other specified respiratory disorders: Secondary | ICD-10-CM | POA: Diagnosis not present

## 2015-11-20 DIAGNOSIS — E1142 Type 2 diabetes mellitus with diabetic polyneuropathy: Secondary | ICD-10-CM | POA: Diagnosis not present

## 2015-11-21 DIAGNOSIS — E1140 Type 2 diabetes mellitus with diabetic neuropathy, unspecified: Secondary | ICD-10-CM | POA: Diagnosis not present

## 2015-11-21 DIAGNOSIS — Z96659 Presence of unspecified artificial knee joint: Secondary | ICD-10-CM | POA: Diagnosis not present

## 2015-11-21 DIAGNOSIS — M069 Rheumatoid arthritis, unspecified: Secondary | ICD-10-CM | POA: Diagnosis not present

## 2015-11-21 DIAGNOSIS — Z471 Aftercare following joint replacement surgery: Secondary | ICD-10-CM | POA: Diagnosis not present

## 2015-11-21 DIAGNOSIS — S70219A Abrasion, unspecified hip, initial encounter: Secondary | ICD-10-CM | POA: Diagnosis not present

## 2015-11-22 DIAGNOSIS — M069 Rheumatoid arthritis, unspecified: Secondary | ICD-10-CM | POA: Diagnosis not present

## 2015-11-22 DIAGNOSIS — S70219A Abrasion, unspecified hip, initial encounter: Secondary | ICD-10-CM | POA: Diagnosis not present

## 2015-11-22 DIAGNOSIS — Z96659 Presence of unspecified artificial knee joint: Secondary | ICD-10-CM | POA: Diagnosis not present

## 2015-11-22 DIAGNOSIS — E1140 Type 2 diabetes mellitus with diabetic neuropathy, unspecified: Secondary | ICD-10-CM | POA: Diagnosis not present

## 2015-11-22 DIAGNOSIS — Z471 Aftercare following joint replacement surgery: Secondary | ICD-10-CM | POA: Diagnosis not present

## 2015-11-24 DIAGNOSIS — Z86711 Personal history of pulmonary embolism: Secondary | ICD-10-CM | POA: Diagnosis not present

## 2015-11-24 DIAGNOSIS — Z96612 Presence of left artificial shoulder joint: Secondary | ICD-10-CM | POA: Diagnosis not present

## 2015-11-24 DIAGNOSIS — J988 Other specified respiratory disorders: Secondary | ICD-10-CM | POA: Diagnosis not present

## 2015-11-24 DIAGNOSIS — M069 Rheumatoid arthritis, unspecified: Secondary | ICD-10-CM | POA: Diagnosis not present

## 2015-11-24 DIAGNOSIS — E1142 Type 2 diabetes mellitus with diabetic polyneuropathy: Secondary | ICD-10-CM | POA: Diagnosis not present

## 2015-11-24 DIAGNOSIS — Z79891 Long term (current) use of opiate analgesic: Secondary | ICD-10-CM | POA: Diagnosis not present

## 2015-11-24 DIAGNOSIS — E785 Hyperlipidemia, unspecified: Secondary | ICD-10-CM | POA: Diagnosis not present

## 2015-11-24 DIAGNOSIS — E1136 Type 2 diabetes mellitus with diabetic cataract: Secondary | ICD-10-CM | POA: Diagnosis not present

## 2015-11-24 DIAGNOSIS — Z7984 Long term (current) use of oral hypoglycemic drugs: Secondary | ICD-10-CM | POA: Diagnosis not present

## 2015-11-24 DIAGNOSIS — Z471 Aftercare following joint replacement surgery: Secondary | ICD-10-CM | POA: Diagnosis not present

## 2015-11-24 DIAGNOSIS — Z7982 Long term (current) use of aspirin: Secondary | ICD-10-CM | POA: Diagnosis not present

## 2015-11-24 DIAGNOSIS — I10 Essential (primary) hypertension: Secondary | ICD-10-CM | POA: Diagnosis not present

## 2015-11-27 DIAGNOSIS — I10 Essential (primary) hypertension: Secondary | ICD-10-CM | POA: Diagnosis not present

## 2015-11-27 DIAGNOSIS — Z79891 Long term (current) use of opiate analgesic: Secondary | ICD-10-CM | POA: Diagnosis not present

## 2015-11-27 DIAGNOSIS — Z86711 Personal history of pulmonary embolism: Secondary | ICD-10-CM | POA: Diagnosis not present

## 2015-11-27 DIAGNOSIS — Z471 Aftercare following joint replacement surgery: Secondary | ICD-10-CM | POA: Diagnosis not present

## 2015-11-27 DIAGNOSIS — E785 Hyperlipidemia, unspecified: Secondary | ICD-10-CM | POA: Diagnosis not present

## 2015-11-27 DIAGNOSIS — J988 Other specified respiratory disorders: Secondary | ICD-10-CM | POA: Diagnosis not present

## 2015-11-27 DIAGNOSIS — M069 Rheumatoid arthritis, unspecified: Secondary | ICD-10-CM | POA: Diagnosis not present

## 2015-11-27 DIAGNOSIS — S70219A Abrasion, unspecified hip, initial encounter: Secondary | ICD-10-CM | POA: Diagnosis not present

## 2015-11-27 DIAGNOSIS — Z7982 Long term (current) use of aspirin: Secondary | ICD-10-CM | POA: Diagnosis not present

## 2015-11-27 DIAGNOSIS — Z96659 Presence of unspecified artificial knee joint: Secondary | ICD-10-CM | POA: Diagnosis not present

## 2015-11-27 DIAGNOSIS — Z96612 Presence of left artificial shoulder joint: Secondary | ICD-10-CM | POA: Diagnosis not present

## 2015-11-27 DIAGNOSIS — Z7984 Long term (current) use of oral hypoglycemic drugs: Secondary | ICD-10-CM | POA: Diagnosis not present

## 2015-11-27 DIAGNOSIS — E1136 Type 2 diabetes mellitus with diabetic cataract: Secondary | ICD-10-CM | POA: Diagnosis not present

## 2015-11-27 DIAGNOSIS — E1142 Type 2 diabetes mellitus with diabetic polyneuropathy: Secondary | ICD-10-CM | POA: Diagnosis not present

## 2015-11-30 DIAGNOSIS — Z7982 Long term (current) use of aspirin: Secondary | ICD-10-CM | POA: Diagnosis not present

## 2015-11-30 DIAGNOSIS — E1136 Type 2 diabetes mellitus with diabetic cataract: Secondary | ICD-10-CM | POA: Diagnosis not present

## 2015-11-30 DIAGNOSIS — Z79891 Long term (current) use of opiate analgesic: Secondary | ICD-10-CM | POA: Diagnosis not present

## 2015-11-30 DIAGNOSIS — J988 Other specified respiratory disorders: Secondary | ICD-10-CM | POA: Diagnosis not present

## 2015-11-30 DIAGNOSIS — Z471 Aftercare following joint replacement surgery: Secondary | ICD-10-CM | POA: Diagnosis not present

## 2015-11-30 DIAGNOSIS — E1142 Type 2 diabetes mellitus with diabetic polyneuropathy: Secondary | ICD-10-CM | POA: Diagnosis not present

## 2015-11-30 DIAGNOSIS — Z7984 Long term (current) use of oral hypoglycemic drugs: Secondary | ICD-10-CM | POA: Diagnosis not present

## 2015-11-30 DIAGNOSIS — E785 Hyperlipidemia, unspecified: Secondary | ICD-10-CM | POA: Diagnosis not present

## 2015-11-30 DIAGNOSIS — Z96612 Presence of left artificial shoulder joint: Secondary | ICD-10-CM | POA: Diagnosis not present

## 2015-11-30 DIAGNOSIS — M069 Rheumatoid arthritis, unspecified: Secondary | ICD-10-CM | POA: Diagnosis not present

## 2015-11-30 DIAGNOSIS — Z86711 Personal history of pulmonary embolism: Secondary | ICD-10-CM | POA: Diagnosis not present

## 2015-11-30 DIAGNOSIS — I10 Essential (primary) hypertension: Secondary | ICD-10-CM | POA: Diagnosis not present

## 2015-12-01 DIAGNOSIS — Z7984 Long term (current) use of oral hypoglycemic drugs: Secondary | ICD-10-CM | POA: Diagnosis not present

## 2015-12-01 DIAGNOSIS — Z86711 Personal history of pulmonary embolism: Secondary | ICD-10-CM | POA: Diagnosis not present

## 2015-12-01 DIAGNOSIS — M069 Rheumatoid arthritis, unspecified: Secondary | ICD-10-CM | POA: Diagnosis not present

## 2015-12-01 DIAGNOSIS — Z96612 Presence of left artificial shoulder joint: Secondary | ICD-10-CM | POA: Diagnosis not present

## 2015-12-01 DIAGNOSIS — E1142 Type 2 diabetes mellitus with diabetic polyneuropathy: Secondary | ICD-10-CM | POA: Diagnosis not present

## 2015-12-01 DIAGNOSIS — Z79891 Long term (current) use of opiate analgesic: Secondary | ICD-10-CM | POA: Diagnosis not present

## 2015-12-01 DIAGNOSIS — Z471 Aftercare following joint replacement surgery: Secondary | ICD-10-CM | POA: Diagnosis not present

## 2015-12-01 DIAGNOSIS — E1136 Type 2 diabetes mellitus with diabetic cataract: Secondary | ICD-10-CM | POA: Diagnosis not present

## 2015-12-01 DIAGNOSIS — E785 Hyperlipidemia, unspecified: Secondary | ICD-10-CM | POA: Diagnosis not present

## 2015-12-01 DIAGNOSIS — I10 Essential (primary) hypertension: Secondary | ICD-10-CM | POA: Diagnosis not present

## 2015-12-01 DIAGNOSIS — Z7982 Long term (current) use of aspirin: Secondary | ICD-10-CM | POA: Diagnosis not present

## 2015-12-01 DIAGNOSIS — J988 Other specified respiratory disorders: Secondary | ICD-10-CM | POA: Diagnosis not present

## 2015-12-03 DIAGNOSIS — M1712 Unilateral primary osteoarthritis, left knee: Secondary | ICD-10-CM | POA: Diagnosis not present

## 2015-12-05 DIAGNOSIS — Z96612 Presence of left artificial shoulder joint: Secondary | ICD-10-CM | POA: Diagnosis not present

## 2015-12-08 DIAGNOSIS — Z7982 Long term (current) use of aspirin: Secondary | ICD-10-CM | POA: Diagnosis not present

## 2015-12-08 DIAGNOSIS — J988 Other specified respiratory disorders: Secondary | ICD-10-CM | POA: Diagnosis not present

## 2015-12-08 DIAGNOSIS — Z79891 Long term (current) use of opiate analgesic: Secondary | ICD-10-CM | POA: Diagnosis not present

## 2015-12-08 DIAGNOSIS — E1136 Type 2 diabetes mellitus with diabetic cataract: Secondary | ICD-10-CM | POA: Diagnosis not present

## 2015-12-08 DIAGNOSIS — E785 Hyperlipidemia, unspecified: Secondary | ICD-10-CM | POA: Diagnosis not present

## 2015-12-08 DIAGNOSIS — Z86711 Personal history of pulmonary embolism: Secondary | ICD-10-CM | POA: Diagnosis not present

## 2015-12-08 DIAGNOSIS — Z7984 Long term (current) use of oral hypoglycemic drugs: Secondary | ICD-10-CM | POA: Diagnosis not present

## 2015-12-08 DIAGNOSIS — E1142 Type 2 diabetes mellitus with diabetic polyneuropathy: Secondary | ICD-10-CM | POA: Diagnosis not present

## 2015-12-08 DIAGNOSIS — Z471 Aftercare following joint replacement surgery: Secondary | ICD-10-CM | POA: Diagnosis not present

## 2015-12-08 DIAGNOSIS — M069 Rheumatoid arthritis, unspecified: Secondary | ICD-10-CM | POA: Diagnosis not present

## 2015-12-08 DIAGNOSIS — I10 Essential (primary) hypertension: Secondary | ICD-10-CM | POA: Diagnosis not present

## 2015-12-08 DIAGNOSIS — Z96612 Presence of left artificial shoulder joint: Secondary | ICD-10-CM | POA: Diagnosis not present

## 2015-12-11 DIAGNOSIS — J988 Other specified respiratory disorders: Secondary | ICD-10-CM | POA: Diagnosis not present

## 2015-12-11 DIAGNOSIS — E1136 Type 2 diabetes mellitus with diabetic cataract: Secondary | ICD-10-CM | POA: Diagnosis not present

## 2015-12-11 DIAGNOSIS — Z86711 Personal history of pulmonary embolism: Secondary | ICD-10-CM | POA: Diagnosis not present

## 2015-12-11 DIAGNOSIS — Z96612 Presence of left artificial shoulder joint: Secondary | ICD-10-CM | POA: Diagnosis not present

## 2015-12-11 DIAGNOSIS — E785 Hyperlipidemia, unspecified: Secondary | ICD-10-CM | POA: Diagnosis not present

## 2015-12-11 DIAGNOSIS — E1142 Type 2 diabetes mellitus with diabetic polyneuropathy: Secondary | ICD-10-CM | POA: Diagnosis not present

## 2015-12-11 DIAGNOSIS — Z7982 Long term (current) use of aspirin: Secondary | ICD-10-CM | POA: Diagnosis not present

## 2015-12-11 DIAGNOSIS — Z7984 Long term (current) use of oral hypoglycemic drugs: Secondary | ICD-10-CM | POA: Diagnosis not present

## 2015-12-11 DIAGNOSIS — I10 Essential (primary) hypertension: Secondary | ICD-10-CM | POA: Diagnosis not present

## 2015-12-11 DIAGNOSIS — Z471 Aftercare following joint replacement surgery: Secondary | ICD-10-CM | POA: Diagnosis not present

## 2015-12-11 DIAGNOSIS — M069 Rheumatoid arthritis, unspecified: Secondary | ICD-10-CM | POA: Diagnosis not present

## 2015-12-11 DIAGNOSIS — Z79891 Long term (current) use of opiate analgesic: Secondary | ICD-10-CM | POA: Diagnosis not present

## 2015-12-15 DIAGNOSIS — M069 Rheumatoid arthritis, unspecified: Secondary | ICD-10-CM | POA: Diagnosis not present

## 2015-12-15 DIAGNOSIS — Z96612 Presence of left artificial shoulder joint: Secondary | ICD-10-CM | POA: Diagnosis not present

## 2015-12-15 DIAGNOSIS — I10 Essential (primary) hypertension: Secondary | ICD-10-CM | POA: Diagnosis not present

## 2015-12-15 DIAGNOSIS — Z79891 Long term (current) use of opiate analgesic: Secondary | ICD-10-CM | POA: Diagnosis not present

## 2015-12-15 DIAGNOSIS — E785 Hyperlipidemia, unspecified: Secondary | ICD-10-CM | POA: Diagnosis not present

## 2015-12-15 DIAGNOSIS — Z7982 Long term (current) use of aspirin: Secondary | ICD-10-CM | POA: Diagnosis not present

## 2015-12-15 DIAGNOSIS — Z86711 Personal history of pulmonary embolism: Secondary | ICD-10-CM | POA: Diagnosis not present

## 2015-12-15 DIAGNOSIS — Z7984 Long term (current) use of oral hypoglycemic drugs: Secondary | ICD-10-CM | POA: Diagnosis not present

## 2015-12-15 DIAGNOSIS — J988 Other specified respiratory disorders: Secondary | ICD-10-CM | POA: Diagnosis not present

## 2015-12-15 DIAGNOSIS — E1136 Type 2 diabetes mellitus with diabetic cataract: Secondary | ICD-10-CM | POA: Diagnosis not present

## 2015-12-15 DIAGNOSIS — Z471 Aftercare following joint replacement surgery: Secondary | ICD-10-CM | POA: Diagnosis not present

## 2015-12-15 DIAGNOSIS — E1142 Type 2 diabetes mellitus with diabetic polyneuropathy: Secondary | ICD-10-CM | POA: Diagnosis not present

## 2015-12-18 DIAGNOSIS — E1136 Type 2 diabetes mellitus with diabetic cataract: Secondary | ICD-10-CM | POA: Diagnosis not present

## 2015-12-18 DIAGNOSIS — J988 Other specified respiratory disorders: Secondary | ICD-10-CM | POA: Diagnosis not present

## 2015-12-18 DIAGNOSIS — Z96612 Presence of left artificial shoulder joint: Secondary | ICD-10-CM | POA: Diagnosis not present

## 2015-12-18 DIAGNOSIS — Z86711 Personal history of pulmonary embolism: Secondary | ICD-10-CM | POA: Diagnosis not present

## 2015-12-18 DIAGNOSIS — Z7982 Long term (current) use of aspirin: Secondary | ICD-10-CM | POA: Diagnosis not present

## 2015-12-18 DIAGNOSIS — E785 Hyperlipidemia, unspecified: Secondary | ICD-10-CM | POA: Diagnosis not present

## 2015-12-18 DIAGNOSIS — Z79891 Long term (current) use of opiate analgesic: Secondary | ICD-10-CM | POA: Diagnosis not present

## 2015-12-18 DIAGNOSIS — Z471 Aftercare following joint replacement surgery: Secondary | ICD-10-CM | POA: Diagnosis not present

## 2015-12-18 DIAGNOSIS — E1142 Type 2 diabetes mellitus with diabetic polyneuropathy: Secondary | ICD-10-CM | POA: Diagnosis not present

## 2015-12-18 DIAGNOSIS — Z7984 Long term (current) use of oral hypoglycemic drugs: Secondary | ICD-10-CM | POA: Diagnosis not present

## 2015-12-18 DIAGNOSIS — I10 Essential (primary) hypertension: Secondary | ICD-10-CM | POA: Diagnosis not present

## 2015-12-18 DIAGNOSIS — M069 Rheumatoid arthritis, unspecified: Secondary | ICD-10-CM | POA: Diagnosis not present

## 2015-12-21 DIAGNOSIS — E1140 Type 2 diabetes mellitus with diabetic neuropathy, unspecified: Secondary | ICD-10-CM | POA: Diagnosis not present

## 2015-12-21 DIAGNOSIS — M069 Rheumatoid arthritis, unspecified: Secondary | ICD-10-CM | POA: Diagnosis not present

## 2015-12-22 DIAGNOSIS — Z79891 Long term (current) use of opiate analgesic: Secondary | ICD-10-CM | POA: Diagnosis not present

## 2015-12-22 DIAGNOSIS — E785 Hyperlipidemia, unspecified: Secondary | ICD-10-CM | POA: Diagnosis not present

## 2015-12-22 DIAGNOSIS — M069 Rheumatoid arthritis, unspecified: Secondary | ICD-10-CM | POA: Diagnosis not present

## 2015-12-22 DIAGNOSIS — I10 Essential (primary) hypertension: Secondary | ICD-10-CM | POA: Diagnosis not present

## 2015-12-22 DIAGNOSIS — Z86711 Personal history of pulmonary embolism: Secondary | ICD-10-CM | POA: Diagnosis not present

## 2015-12-22 DIAGNOSIS — Z7984 Long term (current) use of oral hypoglycemic drugs: Secondary | ICD-10-CM | POA: Diagnosis not present

## 2015-12-22 DIAGNOSIS — Z96612 Presence of left artificial shoulder joint: Secondary | ICD-10-CM | POA: Diagnosis not present

## 2015-12-22 DIAGNOSIS — E1136 Type 2 diabetes mellitus with diabetic cataract: Secondary | ICD-10-CM | POA: Diagnosis not present

## 2015-12-22 DIAGNOSIS — Z7982 Long term (current) use of aspirin: Secondary | ICD-10-CM | POA: Diagnosis not present

## 2015-12-22 DIAGNOSIS — E1142 Type 2 diabetes mellitus with diabetic polyneuropathy: Secondary | ICD-10-CM | POA: Diagnosis not present

## 2015-12-22 DIAGNOSIS — J988 Other specified respiratory disorders: Secondary | ICD-10-CM | POA: Diagnosis not present

## 2015-12-22 DIAGNOSIS — Z471 Aftercare following joint replacement surgery: Secondary | ICD-10-CM | POA: Diagnosis not present

## 2015-12-25 DIAGNOSIS — Z7984 Long term (current) use of oral hypoglycemic drugs: Secondary | ICD-10-CM | POA: Diagnosis not present

## 2015-12-25 DIAGNOSIS — M069 Rheumatoid arthritis, unspecified: Secondary | ICD-10-CM | POA: Diagnosis not present

## 2015-12-25 DIAGNOSIS — Z7982 Long term (current) use of aspirin: Secondary | ICD-10-CM | POA: Diagnosis not present

## 2015-12-25 DIAGNOSIS — Z79891 Long term (current) use of opiate analgesic: Secondary | ICD-10-CM | POA: Diagnosis not present

## 2015-12-25 DIAGNOSIS — Z471 Aftercare following joint replacement surgery: Secondary | ICD-10-CM | POA: Diagnosis not present

## 2015-12-25 DIAGNOSIS — J988 Other specified respiratory disorders: Secondary | ICD-10-CM | POA: Diagnosis not present

## 2015-12-25 DIAGNOSIS — Z96612 Presence of left artificial shoulder joint: Secondary | ICD-10-CM | POA: Diagnosis not present

## 2015-12-25 DIAGNOSIS — E1142 Type 2 diabetes mellitus with diabetic polyneuropathy: Secondary | ICD-10-CM | POA: Diagnosis not present

## 2015-12-25 DIAGNOSIS — E785 Hyperlipidemia, unspecified: Secondary | ICD-10-CM | POA: Diagnosis not present

## 2015-12-25 DIAGNOSIS — Z86711 Personal history of pulmonary embolism: Secondary | ICD-10-CM | POA: Diagnosis not present

## 2015-12-25 DIAGNOSIS — E1136 Type 2 diabetes mellitus with diabetic cataract: Secondary | ICD-10-CM | POA: Diagnosis not present

## 2015-12-25 DIAGNOSIS — I10 Essential (primary) hypertension: Secondary | ICD-10-CM | POA: Diagnosis not present

## 2015-12-29 DIAGNOSIS — M069 Rheumatoid arthritis, unspecified: Secondary | ICD-10-CM | POA: Diagnosis not present

## 2015-12-29 DIAGNOSIS — J988 Other specified respiratory disorders: Secondary | ICD-10-CM | POA: Diagnosis not present

## 2015-12-29 DIAGNOSIS — Z7982 Long term (current) use of aspirin: Secondary | ICD-10-CM | POA: Diagnosis not present

## 2015-12-29 DIAGNOSIS — Z471 Aftercare following joint replacement surgery: Secondary | ICD-10-CM | POA: Diagnosis not present

## 2015-12-29 DIAGNOSIS — Z96612 Presence of left artificial shoulder joint: Secondary | ICD-10-CM | POA: Diagnosis not present

## 2015-12-29 DIAGNOSIS — I10 Essential (primary) hypertension: Secondary | ICD-10-CM | POA: Diagnosis not present

## 2015-12-29 DIAGNOSIS — Z79891 Long term (current) use of opiate analgesic: Secondary | ICD-10-CM | POA: Diagnosis not present

## 2015-12-29 DIAGNOSIS — Z7984 Long term (current) use of oral hypoglycemic drugs: Secondary | ICD-10-CM | POA: Diagnosis not present

## 2015-12-29 DIAGNOSIS — Z86711 Personal history of pulmonary embolism: Secondary | ICD-10-CM | POA: Diagnosis not present

## 2015-12-29 DIAGNOSIS — E1136 Type 2 diabetes mellitus with diabetic cataract: Secondary | ICD-10-CM | POA: Diagnosis not present

## 2015-12-29 DIAGNOSIS — E1142 Type 2 diabetes mellitus with diabetic polyneuropathy: Secondary | ICD-10-CM | POA: Diagnosis not present

## 2015-12-29 DIAGNOSIS — E785 Hyperlipidemia, unspecified: Secondary | ICD-10-CM | POA: Diagnosis not present

## 2016-01-02 DIAGNOSIS — Z86711 Personal history of pulmonary embolism: Secondary | ICD-10-CM | POA: Diagnosis not present

## 2016-01-02 DIAGNOSIS — E785 Hyperlipidemia, unspecified: Secondary | ICD-10-CM | POA: Diagnosis not present

## 2016-01-02 DIAGNOSIS — E1136 Type 2 diabetes mellitus with diabetic cataract: Secondary | ICD-10-CM | POA: Diagnosis not present

## 2016-01-02 DIAGNOSIS — I10 Essential (primary) hypertension: Secondary | ICD-10-CM | POA: Diagnosis not present

## 2016-01-02 DIAGNOSIS — M069 Rheumatoid arthritis, unspecified: Secondary | ICD-10-CM | POA: Diagnosis not present

## 2016-01-02 DIAGNOSIS — Z79891 Long term (current) use of opiate analgesic: Secondary | ICD-10-CM | POA: Diagnosis not present

## 2016-01-02 DIAGNOSIS — Z7984 Long term (current) use of oral hypoglycemic drugs: Secondary | ICD-10-CM | POA: Diagnosis not present

## 2016-01-02 DIAGNOSIS — Z96612 Presence of left artificial shoulder joint: Secondary | ICD-10-CM | POA: Diagnosis not present

## 2016-01-02 DIAGNOSIS — E1142 Type 2 diabetes mellitus with diabetic polyneuropathy: Secondary | ICD-10-CM | POA: Diagnosis not present

## 2016-01-02 DIAGNOSIS — Z471 Aftercare following joint replacement surgery: Secondary | ICD-10-CM | POA: Diagnosis not present

## 2016-01-02 DIAGNOSIS — Z7982 Long term (current) use of aspirin: Secondary | ICD-10-CM | POA: Diagnosis not present

## 2016-01-02 DIAGNOSIS — J988 Other specified respiratory disorders: Secondary | ICD-10-CM | POA: Diagnosis not present

## 2016-01-05 DIAGNOSIS — E1136 Type 2 diabetes mellitus with diabetic cataract: Secondary | ICD-10-CM | POA: Diagnosis not present

## 2016-01-05 DIAGNOSIS — Z7982 Long term (current) use of aspirin: Secondary | ICD-10-CM | POA: Diagnosis not present

## 2016-01-05 DIAGNOSIS — E785 Hyperlipidemia, unspecified: Secondary | ICD-10-CM | POA: Diagnosis not present

## 2016-01-05 DIAGNOSIS — Z96612 Presence of left artificial shoulder joint: Secondary | ICD-10-CM | POA: Diagnosis not present

## 2016-01-05 DIAGNOSIS — Z86711 Personal history of pulmonary embolism: Secondary | ICD-10-CM | POA: Diagnosis not present

## 2016-01-05 DIAGNOSIS — I10 Essential (primary) hypertension: Secondary | ICD-10-CM | POA: Diagnosis not present

## 2016-01-05 DIAGNOSIS — E1142 Type 2 diabetes mellitus with diabetic polyneuropathy: Secondary | ICD-10-CM | POA: Diagnosis not present

## 2016-01-05 DIAGNOSIS — Z7984 Long term (current) use of oral hypoglycemic drugs: Secondary | ICD-10-CM | POA: Diagnosis not present

## 2016-01-05 DIAGNOSIS — M069 Rheumatoid arthritis, unspecified: Secondary | ICD-10-CM | POA: Diagnosis not present

## 2016-01-05 DIAGNOSIS — Z471 Aftercare following joint replacement surgery: Secondary | ICD-10-CM | POA: Diagnosis not present

## 2016-01-05 DIAGNOSIS — Z79891 Long term (current) use of opiate analgesic: Secondary | ICD-10-CM | POA: Diagnosis not present

## 2016-01-05 DIAGNOSIS — J988 Other specified respiratory disorders: Secondary | ICD-10-CM | POA: Diagnosis not present

## 2016-01-07 DIAGNOSIS — E1122 Type 2 diabetes mellitus with diabetic chronic kidney disease: Secondary | ICD-10-CM | POA: Diagnosis not present

## 2016-01-07 DIAGNOSIS — Z79899 Other long term (current) drug therapy: Secondary | ICD-10-CM | POA: Diagnosis not present

## 2016-01-07 DIAGNOSIS — M0579 Rheumatoid arthritis with rheumatoid factor of multiple sites without organ or systems involvement: Secondary | ICD-10-CM | POA: Diagnosis not present

## 2016-01-07 DIAGNOSIS — M25562 Pain in left knee: Secondary | ICD-10-CM | POA: Diagnosis not present

## 2016-01-07 DIAGNOSIS — M150 Primary generalized (osteo)arthritis: Secondary | ICD-10-CM | POA: Diagnosis not present

## 2016-01-08 DIAGNOSIS — I10 Essential (primary) hypertension: Secondary | ICD-10-CM | POA: Diagnosis not present

## 2016-01-08 DIAGNOSIS — R600 Localized edema: Secondary | ICD-10-CM | POA: Diagnosis not present

## 2016-01-08 DIAGNOSIS — E1141 Type 2 diabetes mellitus with diabetic mononeuropathy: Secondary | ICD-10-CM | POA: Diagnosis not present

## 2016-01-08 DIAGNOSIS — E7800 Pure hypercholesterolemia, unspecified: Secondary | ICD-10-CM | POA: Diagnosis not present

## 2016-01-08 DIAGNOSIS — Z79899 Other long term (current) drug therapy: Secondary | ICD-10-CM | POA: Diagnosis not present

## 2016-01-09 DIAGNOSIS — I10 Essential (primary) hypertension: Secondary | ICD-10-CM | POA: Diagnosis not present

## 2016-01-09 DIAGNOSIS — Z86711 Personal history of pulmonary embolism: Secondary | ICD-10-CM | POA: Diagnosis not present

## 2016-01-09 DIAGNOSIS — Z79891 Long term (current) use of opiate analgesic: Secondary | ICD-10-CM | POA: Diagnosis not present

## 2016-01-09 DIAGNOSIS — Z7984 Long term (current) use of oral hypoglycemic drugs: Secondary | ICD-10-CM | POA: Diagnosis not present

## 2016-01-09 DIAGNOSIS — J988 Other specified respiratory disorders: Secondary | ICD-10-CM | POA: Diagnosis not present

## 2016-01-09 DIAGNOSIS — M069 Rheumatoid arthritis, unspecified: Secondary | ICD-10-CM | POA: Diagnosis not present

## 2016-01-09 DIAGNOSIS — Z471 Aftercare following joint replacement surgery: Secondary | ICD-10-CM | POA: Diagnosis not present

## 2016-01-09 DIAGNOSIS — E785 Hyperlipidemia, unspecified: Secondary | ICD-10-CM | POA: Diagnosis not present

## 2016-01-09 DIAGNOSIS — E1142 Type 2 diabetes mellitus with diabetic polyneuropathy: Secondary | ICD-10-CM | POA: Diagnosis not present

## 2016-01-09 DIAGNOSIS — E1136 Type 2 diabetes mellitus with diabetic cataract: Secondary | ICD-10-CM | POA: Diagnosis not present

## 2016-01-09 DIAGNOSIS — Z96612 Presence of left artificial shoulder joint: Secondary | ICD-10-CM | POA: Diagnosis not present

## 2016-01-09 DIAGNOSIS — Z7982 Long term (current) use of aspirin: Secondary | ICD-10-CM | POA: Diagnosis not present

## 2016-01-13 DIAGNOSIS — Z471 Aftercare following joint replacement surgery: Secondary | ICD-10-CM | POA: Diagnosis not present

## 2016-01-13 DIAGNOSIS — E1142 Type 2 diabetes mellitus with diabetic polyneuropathy: Secondary | ICD-10-CM | POA: Diagnosis not present

## 2016-01-13 DIAGNOSIS — Z96612 Presence of left artificial shoulder joint: Secondary | ICD-10-CM | POA: Diagnosis not present

## 2016-01-13 DIAGNOSIS — I10 Essential (primary) hypertension: Secondary | ICD-10-CM | POA: Diagnosis not present

## 2016-01-13 DIAGNOSIS — E1136 Type 2 diabetes mellitus with diabetic cataract: Secondary | ICD-10-CM | POA: Diagnosis not present

## 2016-01-13 DIAGNOSIS — Z7982 Long term (current) use of aspirin: Secondary | ICD-10-CM | POA: Diagnosis not present

## 2016-01-13 DIAGNOSIS — M069 Rheumatoid arthritis, unspecified: Secondary | ICD-10-CM | POA: Diagnosis not present

## 2016-01-13 DIAGNOSIS — Z86711 Personal history of pulmonary embolism: Secondary | ICD-10-CM | POA: Diagnosis not present

## 2016-01-13 DIAGNOSIS — E785 Hyperlipidemia, unspecified: Secondary | ICD-10-CM | POA: Diagnosis not present

## 2016-01-13 DIAGNOSIS — J988 Other specified respiratory disorders: Secondary | ICD-10-CM | POA: Diagnosis not present

## 2016-01-13 DIAGNOSIS — Z79891 Long term (current) use of opiate analgesic: Secondary | ICD-10-CM | POA: Diagnosis not present

## 2016-01-13 DIAGNOSIS — Z7984 Long term (current) use of oral hypoglycemic drugs: Secondary | ICD-10-CM | POA: Diagnosis not present

## 2016-01-15 DIAGNOSIS — N3941 Urge incontinence: Secondary | ICD-10-CM | POA: Diagnosis not present

## 2016-01-16 DIAGNOSIS — Z79891 Long term (current) use of opiate analgesic: Secondary | ICD-10-CM | POA: Diagnosis not present

## 2016-01-16 DIAGNOSIS — M069 Rheumatoid arthritis, unspecified: Secondary | ICD-10-CM | POA: Diagnosis not present

## 2016-01-16 DIAGNOSIS — Z86711 Personal history of pulmonary embolism: Secondary | ICD-10-CM | POA: Diagnosis not present

## 2016-01-16 DIAGNOSIS — Z7982 Long term (current) use of aspirin: Secondary | ICD-10-CM | POA: Diagnosis not present

## 2016-01-16 DIAGNOSIS — E1136 Type 2 diabetes mellitus with diabetic cataract: Secondary | ICD-10-CM | POA: Diagnosis not present

## 2016-01-16 DIAGNOSIS — E1142 Type 2 diabetes mellitus with diabetic polyneuropathy: Secondary | ICD-10-CM | POA: Diagnosis not present

## 2016-01-16 DIAGNOSIS — Z96612 Presence of left artificial shoulder joint: Secondary | ICD-10-CM | POA: Diagnosis not present

## 2016-01-16 DIAGNOSIS — I10 Essential (primary) hypertension: Secondary | ICD-10-CM | POA: Diagnosis not present

## 2016-01-16 DIAGNOSIS — J988 Other specified respiratory disorders: Secondary | ICD-10-CM | POA: Diagnosis not present

## 2016-01-16 DIAGNOSIS — E785 Hyperlipidemia, unspecified: Secondary | ICD-10-CM | POA: Diagnosis not present

## 2016-01-16 DIAGNOSIS — Z471 Aftercare following joint replacement surgery: Secondary | ICD-10-CM | POA: Diagnosis not present

## 2016-01-16 DIAGNOSIS — Z7984 Long term (current) use of oral hypoglycemic drugs: Secondary | ICD-10-CM | POA: Diagnosis not present

## 2016-01-21 DIAGNOSIS — E1136 Type 2 diabetes mellitus with diabetic cataract: Secondary | ICD-10-CM | POA: Diagnosis not present

## 2016-01-21 DIAGNOSIS — E1140 Type 2 diabetes mellitus with diabetic neuropathy, unspecified: Secondary | ICD-10-CM | POA: Diagnosis not present

## 2016-01-21 DIAGNOSIS — I10 Essential (primary) hypertension: Secondary | ICD-10-CM | POA: Diagnosis not present

## 2016-01-21 DIAGNOSIS — M069 Rheumatoid arthritis, unspecified: Secondary | ICD-10-CM | POA: Diagnosis not present

## 2016-01-21 DIAGNOSIS — Z7984 Long term (current) use of oral hypoglycemic drugs: Secondary | ICD-10-CM | POA: Diagnosis not present

## 2016-01-21 DIAGNOSIS — J988 Other specified respiratory disorders: Secondary | ICD-10-CM | POA: Diagnosis not present

## 2016-01-21 DIAGNOSIS — Z86711 Personal history of pulmonary embolism: Secondary | ICD-10-CM | POA: Diagnosis not present

## 2016-01-21 DIAGNOSIS — E1142 Type 2 diabetes mellitus with diabetic polyneuropathy: Secondary | ICD-10-CM | POA: Diagnosis not present

## 2016-01-21 DIAGNOSIS — Z79891 Long term (current) use of opiate analgesic: Secondary | ICD-10-CM | POA: Diagnosis not present

## 2016-01-21 DIAGNOSIS — Z471 Aftercare following joint replacement surgery: Secondary | ICD-10-CM | POA: Diagnosis not present

## 2016-01-21 DIAGNOSIS — Z96612 Presence of left artificial shoulder joint: Secondary | ICD-10-CM | POA: Diagnosis not present

## 2016-01-21 DIAGNOSIS — E785 Hyperlipidemia, unspecified: Secondary | ICD-10-CM | POA: Diagnosis not present

## 2016-01-21 DIAGNOSIS — Z7982 Long term (current) use of aspirin: Secondary | ICD-10-CM | POA: Diagnosis not present

## 2016-01-23 DIAGNOSIS — E1142 Type 2 diabetes mellitus with diabetic polyneuropathy: Secondary | ICD-10-CM | POA: Diagnosis not present

## 2016-01-23 DIAGNOSIS — Z79891 Long term (current) use of opiate analgesic: Secondary | ICD-10-CM | POA: Diagnosis not present

## 2016-01-23 DIAGNOSIS — Z86711 Personal history of pulmonary embolism: Secondary | ICD-10-CM | POA: Diagnosis not present

## 2016-01-23 DIAGNOSIS — J988 Other specified respiratory disorders: Secondary | ICD-10-CM | POA: Diagnosis not present

## 2016-01-23 DIAGNOSIS — E785 Hyperlipidemia, unspecified: Secondary | ICD-10-CM | POA: Diagnosis not present

## 2016-01-23 DIAGNOSIS — E1136 Type 2 diabetes mellitus with diabetic cataract: Secondary | ICD-10-CM | POA: Diagnosis not present

## 2016-01-23 DIAGNOSIS — M069 Rheumatoid arthritis, unspecified: Secondary | ICD-10-CM | POA: Diagnosis not present

## 2016-01-23 DIAGNOSIS — Z471 Aftercare following joint replacement surgery: Secondary | ICD-10-CM | POA: Diagnosis not present

## 2016-01-23 DIAGNOSIS — I10 Essential (primary) hypertension: Secondary | ICD-10-CM | POA: Diagnosis not present

## 2016-01-23 DIAGNOSIS — Z7982 Long term (current) use of aspirin: Secondary | ICD-10-CM | POA: Diagnosis not present

## 2016-01-23 DIAGNOSIS — Z96612 Presence of left artificial shoulder joint: Secondary | ICD-10-CM | POA: Diagnosis not present

## 2016-01-23 DIAGNOSIS — Z7984 Long term (current) use of oral hypoglycemic drugs: Secondary | ICD-10-CM | POA: Diagnosis not present

## 2016-01-27 DIAGNOSIS — Z86711 Personal history of pulmonary embolism: Secondary | ICD-10-CM | POA: Diagnosis not present

## 2016-01-27 DIAGNOSIS — Z7984 Long term (current) use of oral hypoglycemic drugs: Secondary | ICD-10-CM | POA: Diagnosis not present

## 2016-01-27 DIAGNOSIS — Z79891 Long term (current) use of opiate analgesic: Secondary | ICD-10-CM | POA: Diagnosis not present

## 2016-01-27 DIAGNOSIS — E1136 Type 2 diabetes mellitus with diabetic cataract: Secondary | ICD-10-CM | POA: Diagnosis not present

## 2016-01-27 DIAGNOSIS — Z7982 Long term (current) use of aspirin: Secondary | ICD-10-CM | POA: Diagnosis not present

## 2016-01-27 DIAGNOSIS — Z471 Aftercare following joint replacement surgery: Secondary | ICD-10-CM | POA: Diagnosis not present

## 2016-01-27 DIAGNOSIS — Z96612 Presence of left artificial shoulder joint: Secondary | ICD-10-CM | POA: Diagnosis not present

## 2016-01-27 DIAGNOSIS — M069 Rheumatoid arthritis, unspecified: Secondary | ICD-10-CM | POA: Diagnosis not present

## 2016-01-27 DIAGNOSIS — E785 Hyperlipidemia, unspecified: Secondary | ICD-10-CM | POA: Diagnosis not present

## 2016-01-27 DIAGNOSIS — J988 Other specified respiratory disorders: Secondary | ICD-10-CM | POA: Diagnosis not present

## 2016-01-27 DIAGNOSIS — E1142 Type 2 diabetes mellitus with diabetic polyneuropathy: Secondary | ICD-10-CM | POA: Diagnosis not present

## 2016-01-27 DIAGNOSIS — I10 Essential (primary) hypertension: Secondary | ICD-10-CM | POA: Diagnosis not present

## 2016-01-28 DIAGNOSIS — M1712 Unilateral primary osteoarthritis, left knee: Secondary | ICD-10-CM | POA: Diagnosis not present

## 2016-01-30 DIAGNOSIS — Z7984 Long term (current) use of oral hypoglycemic drugs: Secondary | ICD-10-CM | POA: Diagnosis not present

## 2016-01-30 DIAGNOSIS — J988 Other specified respiratory disorders: Secondary | ICD-10-CM | POA: Diagnosis not present

## 2016-01-30 DIAGNOSIS — I10 Essential (primary) hypertension: Secondary | ICD-10-CM | POA: Diagnosis not present

## 2016-01-30 DIAGNOSIS — E785 Hyperlipidemia, unspecified: Secondary | ICD-10-CM | POA: Diagnosis not present

## 2016-01-30 DIAGNOSIS — M069 Rheumatoid arthritis, unspecified: Secondary | ICD-10-CM | POA: Diagnosis not present

## 2016-01-30 DIAGNOSIS — Z86711 Personal history of pulmonary embolism: Secondary | ICD-10-CM | POA: Diagnosis not present

## 2016-01-30 DIAGNOSIS — Z96612 Presence of left artificial shoulder joint: Secondary | ICD-10-CM | POA: Diagnosis not present

## 2016-01-30 DIAGNOSIS — E1136 Type 2 diabetes mellitus with diabetic cataract: Secondary | ICD-10-CM | POA: Diagnosis not present

## 2016-01-30 DIAGNOSIS — Z471 Aftercare following joint replacement surgery: Secondary | ICD-10-CM | POA: Diagnosis not present

## 2016-01-30 DIAGNOSIS — E1142 Type 2 diabetes mellitus with diabetic polyneuropathy: Secondary | ICD-10-CM | POA: Diagnosis not present

## 2016-01-30 DIAGNOSIS — Z7982 Long term (current) use of aspirin: Secondary | ICD-10-CM | POA: Diagnosis not present

## 2016-01-30 DIAGNOSIS — Z79891 Long term (current) use of opiate analgesic: Secondary | ICD-10-CM | POA: Diagnosis not present

## 2016-02-03 DIAGNOSIS — Z86711 Personal history of pulmonary embolism: Secondary | ICD-10-CM | POA: Diagnosis not present

## 2016-02-03 DIAGNOSIS — E1142 Type 2 diabetes mellitus with diabetic polyneuropathy: Secondary | ICD-10-CM | POA: Diagnosis not present

## 2016-02-03 DIAGNOSIS — E1136 Type 2 diabetes mellitus with diabetic cataract: Secondary | ICD-10-CM | POA: Diagnosis not present

## 2016-02-03 DIAGNOSIS — I10 Essential (primary) hypertension: Secondary | ICD-10-CM | POA: Diagnosis not present

## 2016-02-03 DIAGNOSIS — Z79891 Long term (current) use of opiate analgesic: Secondary | ICD-10-CM | POA: Diagnosis not present

## 2016-02-03 DIAGNOSIS — J988 Other specified respiratory disorders: Secondary | ICD-10-CM | POA: Diagnosis not present

## 2016-02-03 DIAGNOSIS — Z7982 Long term (current) use of aspirin: Secondary | ICD-10-CM | POA: Diagnosis not present

## 2016-02-03 DIAGNOSIS — Z471 Aftercare following joint replacement surgery: Secondary | ICD-10-CM | POA: Diagnosis not present

## 2016-02-03 DIAGNOSIS — Z7984 Long term (current) use of oral hypoglycemic drugs: Secondary | ICD-10-CM | POA: Diagnosis not present

## 2016-02-03 DIAGNOSIS — M069 Rheumatoid arthritis, unspecified: Secondary | ICD-10-CM | POA: Diagnosis not present

## 2016-02-03 DIAGNOSIS — Z96612 Presence of left artificial shoulder joint: Secondary | ICD-10-CM | POA: Diagnosis not present

## 2016-02-03 DIAGNOSIS — E785 Hyperlipidemia, unspecified: Secondary | ICD-10-CM | POA: Diagnosis not present

## 2016-02-04 DIAGNOSIS — M1712 Unilateral primary osteoarthritis, left knee: Secondary | ICD-10-CM | POA: Diagnosis not present

## 2016-02-06 DIAGNOSIS — Z96612 Presence of left artificial shoulder joint: Secondary | ICD-10-CM | POA: Diagnosis not present

## 2016-02-06 DIAGNOSIS — M069 Rheumatoid arthritis, unspecified: Secondary | ICD-10-CM | POA: Diagnosis not present

## 2016-02-06 DIAGNOSIS — Z7982 Long term (current) use of aspirin: Secondary | ICD-10-CM | POA: Diagnosis not present

## 2016-02-06 DIAGNOSIS — E785 Hyperlipidemia, unspecified: Secondary | ICD-10-CM | POA: Diagnosis not present

## 2016-02-06 DIAGNOSIS — Z471 Aftercare following joint replacement surgery: Secondary | ICD-10-CM | POA: Diagnosis not present

## 2016-02-06 DIAGNOSIS — J988 Other specified respiratory disorders: Secondary | ICD-10-CM | POA: Diagnosis not present

## 2016-02-06 DIAGNOSIS — E1136 Type 2 diabetes mellitus with diabetic cataract: Secondary | ICD-10-CM | POA: Diagnosis not present

## 2016-02-06 DIAGNOSIS — Z86711 Personal history of pulmonary embolism: Secondary | ICD-10-CM | POA: Diagnosis not present

## 2016-02-06 DIAGNOSIS — Z7984 Long term (current) use of oral hypoglycemic drugs: Secondary | ICD-10-CM | POA: Diagnosis not present

## 2016-02-06 DIAGNOSIS — I10 Essential (primary) hypertension: Secondary | ICD-10-CM | POA: Diagnosis not present

## 2016-02-06 DIAGNOSIS — Z79891 Long term (current) use of opiate analgesic: Secondary | ICD-10-CM | POA: Diagnosis not present

## 2016-02-06 DIAGNOSIS — E1142 Type 2 diabetes mellitus with diabetic polyneuropathy: Secondary | ICD-10-CM | POA: Diagnosis not present

## 2016-02-10 DIAGNOSIS — Z79891 Long term (current) use of opiate analgesic: Secondary | ICD-10-CM | POA: Diagnosis not present

## 2016-02-10 DIAGNOSIS — M069 Rheumatoid arthritis, unspecified: Secondary | ICD-10-CM | POA: Diagnosis not present

## 2016-02-10 DIAGNOSIS — Z96612 Presence of left artificial shoulder joint: Secondary | ICD-10-CM | POA: Diagnosis not present

## 2016-02-10 DIAGNOSIS — E785 Hyperlipidemia, unspecified: Secondary | ICD-10-CM | POA: Diagnosis not present

## 2016-02-10 DIAGNOSIS — Z7984 Long term (current) use of oral hypoglycemic drugs: Secondary | ICD-10-CM | POA: Diagnosis not present

## 2016-02-10 DIAGNOSIS — J988 Other specified respiratory disorders: Secondary | ICD-10-CM | POA: Diagnosis not present

## 2016-02-10 DIAGNOSIS — Z86711 Personal history of pulmonary embolism: Secondary | ICD-10-CM | POA: Diagnosis not present

## 2016-02-10 DIAGNOSIS — E1142 Type 2 diabetes mellitus with diabetic polyneuropathy: Secondary | ICD-10-CM | POA: Diagnosis not present

## 2016-02-10 DIAGNOSIS — Z471 Aftercare following joint replacement surgery: Secondary | ICD-10-CM | POA: Diagnosis not present

## 2016-02-10 DIAGNOSIS — E1136 Type 2 diabetes mellitus with diabetic cataract: Secondary | ICD-10-CM | POA: Diagnosis not present

## 2016-02-10 DIAGNOSIS — I10 Essential (primary) hypertension: Secondary | ICD-10-CM | POA: Diagnosis not present

## 2016-02-10 DIAGNOSIS — Z7982 Long term (current) use of aspirin: Secondary | ICD-10-CM | POA: Diagnosis not present

## 2016-02-11 DIAGNOSIS — M1712 Unilateral primary osteoarthritis, left knee: Secondary | ICD-10-CM | POA: Diagnosis not present

## 2016-02-13 DIAGNOSIS — J988 Other specified respiratory disorders: Secondary | ICD-10-CM | POA: Diagnosis not present

## 2016-02-13 DIAGNOSIS — E1136 Type 2 diabetes mellitus with diabetic cataract: Secondary | ICD-10-CM | POA: Diagnosis not present

## 2016-02-13 DIAGNOSIS — Z96612 Presence of left artificial shoulder joint: Secondary | ICD-10-CM | POA: Diagnosis not present

## 2016-02-13 DIAGNOSIS — Z7982 Long term (current) use of aspirin: Secondary | ICD-10-CM | POA: Diagnosis not present

## 2016-02-13 DIAGNOSIS — E1142 Type 2 diabetes mellitus with diabetic polyneuropathy: Secondary | ICD-10-CM | POA: Diagnosis not present

## 2016-02-13 DIAGNOSIS — I10 Essential (primary) hypertension: Secondary | ICD-10-CM | POA: Diagnosis not present

## 2016-02-13 DIAGNOSIS — Z86711 Personal history of pulmonary embolism: Secondary | ICD-10-CM | POA: Diagnosis not present

## 2016-02-13 DIAGNOSIS — E785 Hyperlipidemia, unspecified: Secondary | ICD-10-CM | POA: Diagnosis not present

## 2016-02-13 DIAGNOSIS — Z79891 Long term (current) use of opiate analgesic: Secondary | ICD-10-CM | POA: Diagnosis not present

## 2016-02-13 DIAGNOSIS — Z471 Aftercare following joint replacement surgery: Secondary | ICD-10-CM | POA: Diagnosis not present

## 2016-02-13 DIAGNOSIS — M069 Rheumatoid arthritis, unspecified: Secondary | ICD-10-CM | POA: Diagnosis not present

## 2016-02-13 DIAGNOSIS — Z7984 Long term (current) use of oral hypoglycemic drugs: Secondary | ICD-10-CM | POA: Diagnosis not present

## 2016-02-18 DIAGNOSIS — I10 Essential (primary) hypertension: Secondary | ICD-10-CM | POA: Diagnosis not present

## 2016-02-18 DIAGNOSIS — M069 Rheumatoid arthritis, unspecified: Secondary | ICD-10-CM | POA: Diagnosis not present

## 2016-02-18 DIAGNOSIS — Z79891 Long term (current) use of opiate analgesic: Secondary | ICD-10-CM | POA: Diagnosis not present

## 2016-02-18 DIAGNOSIS — Z471 Aftercare following joint replacement surgery: Secondary | ICD-10-CM | POA: Diagnosis not present

## 2016-02-18 DIAGNOSIS — E785 Hyperlipidemia, unspecified: Secondary | ICD-10-CM | POA: Diagnosis not present

## 2016-02-18 DIAGNOSIS — Z86711 Personal history of pulmonary embolism: Secondary | ICD-10-CM | POA: Diagnosis not present

## 2016-02-18 DIAGNOSIS — Z7984 Long term (current) use of oral hypoglycemic drugs: Secondary | ICD-10-CM | POA: Diagnosis not present

## 2016-02-18 DIAGNOSIS — Z7982 Long term (current) use of aspirin: Secondary | ICD-10-CM | POA: Diagnosis not present

## 2016-02-18 DIAGNOSIS — J988 Other specified respiratory disorders: Secondary | ICD-10-CM | POA: Diagnosis not present

## 2016-02-18 DIAGNOSIS — Z96612 Presence of left artificial shoulder joint: Secondary | ICD-10-CM | POA: Diagnosis not present

## 2016-02-18 DIAGNOSIS — E1142 Type 2 diabetes mellitus with diabetic polyneuropathy: Secondary | ICD-10-CM | POA: Diagnosis not present

## 2016-02-18 DIAGNOSIS — E1136 Type 2 diabetes mellitus with diabetic cataract: Secondary | ICD-10-CM | POA: Diagnosis not present

## 2016-02-20 DIAGNOSIS — Z96612 Presence of left artificial shoulder joint: Secondary | ICD-10-CM | POA: Diagnosis not present

## 2016-02-20 DIAGNOSIS — Z86711 Personal history of pulmonary embolism: Secondary | ICD-10-CM | POA: Diagnosis not present

## 2016-02-20 DIAGNOSIS — E785 Hyperlipidemia, unspecified: Secondary | ICD-10-CM | POA: Diagnosis not present

## 2016-02-20 DIAGNOSIS — I10 Essential (primary) hypertension: Secondary | ICD-10-CM | POA: Diagnosis not present

## 2016-02-20 DIAGNOSIS — Z7984 Long term (current) use of oral hypoglycemic drugs: Secondary | ICD-10-CM | POA: Diagnosis not present

## 2016-02-20 DIAGNOSIS — Z79891 Long term (current) use of opiate analgesic: Secondary | ICD-10-CM | POA: Diagnosis not present

## 2016-02-20 DIAGNOSIS — Z7982 Long term (current) use of aspirin: Secondary | ICD-10-CM | POA: Diagnosis not present

## 2016-02-20 DIAGNOSIS — J988 Other specified respiratory disorders: Secondary | ICD-10-CM | POA: Diagnosis not present

## 2016-02-20 DIAGNOSIS — E1136 Type 2 diabetes mellitus with diabetic cataract: Secondary | ICD-10-CM | POA: Diagnosis not present

## 2016-02-20 DIAGNOSIS — E1142 Type 2 diabetes mellitus with diabetic polyneuropathy: Secondary | ICD-10-CM | POA: Diagnosis not present

## 2016-02-20 DIAGNOSIS — Z471 Aftercare following joint replacement surgery: Secondary | ICD-10-CM | POA: Diagnosis not present

## 2016-02-20 DIAGNOSIS — M069 Rheumatoid arthritis, unspecified: Secondary | ICD-10-CM | POA: Diagnosis not present

## 2016-02-21 DIAGNOSIS — E1140 Type 2 diabetes mellitus with diabetic neuropathy, unspecified: Secondary | ICD-10-CM | POA: Diagnosis not present

## 2016-02-21 DIAGNOSIS — M069 Rheumatoid arthritis, unspecified: Secondary | ICD-10-CM | POA: Diagnosis not present

## 2016-03-02 DIAGNOSIS — Z23 Encounter for immunization: Secondary | ICD-10-CM | POA: Diagnosis not present

## 2016-03-22 DIAGNOSIS — Z96612 Presence of left artificial shoulder joint: Secondary | ICD-10-CM | POA: Diagnosis not present

## 2016-03-22 DIAGNOSIS — E1140 Type 2 diabetes mellitus with diabetic neuropathy, unspecified: Secondary | ICD-10-CM | POA: Diagnosis not present

## 2016-03-22 DIAGNOSIS — M069 Rheumatoid arthritis, unspecified: Secondary | ICD-10-CM | POA: Diagnosis not present

## 2016-04-02 DIAGNOSIS — E7800 Pure hypercholesterolemia, unspecified: Secondary | ICD-10-CM | POA: Diagnosis not present

## 2016-04-02 DIAGNOSIS — R600 Localized edema: Secondary | ICD-10-CM | POA: Diagnosis not present

## 2016-04-02 DIAGNOSIS — Z79899 Other long term (current) drug therapy: Secondary | ICD-10-CM | POA: Diagnosis not present

## 2016-04-02 DIAGNOSIS — M0579 Rheumatoid arthritis with rheumatoid factor of multiple sites without organ or systems involvement: Secondary | ICD-10-CM | POA: Diagnosis not present

## 2016-04-02 DIAGNOSIS — E1141 Type 2 diabetes mellitus with diabetic mononeuropathy: Secondary | ICD-10-CM | POA: Diagnosis not present

## 2016-04-02 DIAGNOSIS — I10 Essential (primary) hypertension: Secondary | ICD-10-CM | POA: Diagnosis not present

## 2016-04-06 DIAGNOSIS — M150 Primary generalized (osteo)arthritis: Secondary | ICD-10-CM | POA: Diagnosis not present

## 2016-04-06 DIAGNOSIS — G629 Polyneuropathy, unspecified: Secondary | ICD-10-CM | POA: Diagnosis not present

## 2016-04-06 DIAGNOSIS — M0579 Rheumatoid arthritis with rheumatoid factor of multiple sites without organ or systems involvement: Secondary | ICD-10-CM | POA: Diagnosis not present

## 2016-04-06 DIAGNOSIS — Z96612 Presence of left artificial shoulder joint: Secondary | ICD-10-CM | POA: Diagnosis not present

## 2016-04-22 DIAGNOSIS — E1140 Type 2 diabetes mellitus with diabetic neuropathy, unspecified: Secondary | ICD-10-CM | POA: Diagnosis not present

## 2016-04-22 DIAGNOSIS — M069 Rheumatoid arthritis, unspecified: Secondary | ICD-10-CM | POA: Diagnosis not present

## 2016-04-29 DIAGNOSIS — R339 Retention of urine, unspecified: Secondary | ICD-10-CM | POA: Diagnosis not present

## 2016-04-29 DIAGNOSIS — L304 Erythema intertrigo: Secondary | ICD-10-CM | POA: Diagnosis not present

## 2016-05-05 DIAGNOSIS — Z96612 Presence of left artificial shoulder joint: Secondary | ICD-10-CM | POA: Diagnosis not present

## 2016-05-05 DIAGNOSIS — S43402A Unspecified sprain of left shoulder joint, initial encounter: Secondary | ICD-10-CM | POA: Diagnosis not present

## 2016-05-07 DIAGNOSIS — R300 Dysuria: Secondary | ICD-10-CM | POA: Diagnosis not present

## 2016-05-07 DIAGNOSIS — R05 Cough: Secondary | ICD-10-CM | POA: Diagnosis not present

## 2016-05-07 DIAGNOSIS — R079 Chest pain, unspecified: Secondary | ICD-10-CM | POA: Diagnosis not present

## 2016-05-22 DIAGNOSIS — E1140 Type 2 diabetes mellitus with diabetic neuropathy, unspecified: Secondary | ICD-10-CM | POA: Diagnosis not present

## 2016-05-22 DIAGNOSIS — M069 Rheumatoid arthritis, unspecified: Secondary | ICD-10-CM | POA: Diagnosis not present

## 2017-04-10 ENCOUNTER — Encounter: Payer: Self-pay | Admitting: Emergency Medicine

## 2017-04-10 ENCOUNTER — Inpatient Hospital Stay
Admission: EM | Admit: 2017-04-10 | Discharge: 2017-04-18 | DRG: 871 | Disposition: A | Discharge status: Home Health | Payer: Medicare Other | Attending: Internal Medicine | Admitting: Internal Medicine

## 2017-04-10 ENCOUNTER — Emergency Department: Payer: Medicare Other

## 2017-04-10 ENCOUNTER — Other Ambulatory Visit: Payer: Self-pay

## 2017-04-10 DIAGNOSIS — M7062 Trochanteric bursitis, left hip: Secondary | ICD-10-CM | POA: Diagnosis present

## 2017-04-10 DIAGNOSIS — L97829 Non-pressure chronic ulcer of other part of left lower leg with unspecified severity: Secondary | ICD-10-CM | POA: Diagnosis present

## 2017-04-10 DIAGNOSIS — M4644 Discitis, unspecified, thoracic region: Secondary | ICD-10-CM | POA: Diagnosis present

## 2017-04-10 DIAGNOSIS — J9601 Acute respiratory failure with hypoxia: Secondary | ICD-10-CM | POA: Diagnosis not present

## 2017-04-10 DIAGNOSIS — A4102 Sepsis due to Methicillin resistant Staphylococcus aureus: Secondary | ICD-10-CM | POA: Diagnosis present

## 2017-04-10 DIAGNOSIS — E1169 Type 2 diabetes mellitus with other specified complication: Secondary | ICD-10-CM | POA: Diagnosis present

## 2017-04-10 DIAGNOSIS — Z993 Dependence on wheelchair: Secondary | ICD-10-CM | POA: Diagnosis not present

## 2017-04-10 DIAGNOSIS — R059 Cough, unspecified: Secondary | ICD-10-CM

## 2017-04-10 DIAGNOSIS — M4624 Osteomyelitis of vertebra, thoracic region: Secondary | ICD-10-CM | POA: Diagnosis present

## 2017-04-10 DIAGNOSIS — Z86711 Personal history of pulmonary embolism: Secondary | ICD-10-CM | POA: Diagnosis not present

## 2017-04-10 DIAGNOSIS — G8929 Other chronic pain: Secondary | ICD-10-CM | POA: Diagnosis present

## 2017-04-10 DIAGNOSIS — E1140 Type 2 diabetes mellitus with diabetic neuropathy, unspecified: Secondary | ICD-10-CM | POA: Diagnosis present

## 2017-04-10 DIAGNOSIS — E785 Hyperlipidemia, unspecified: Secondary | ICD-10-CM | POA: Diagnosis present

## 2017-04-10 DIAGNOSIS — G9341 Metabolic encephalopathy: Secondary | ICD-10-CM | POA: Diagnosis present

## 2017-04-10 DIAGNOSIS — R6520 Severe sepsis without septic shock: Secondary | ICD-10-CM | POA: Diagnosis present

## 2017-04-10 DIAGNOSIS — M7061 Trochanteric bursitis, right hip: Secondary | ICD-10-CM | POA: Diagnosis present

## 2017-04-10 DIAGNOSIS — I10 Essential (primary) hypertension: Secondary | ICD-10-CM | POA: Diagnosis present

## 2017-04-10 DIAGNOSIS — Z833 Family history of diabetes mellitus: Secondary | ICD-10-CM

## 2017-04-10 DIAGNOSIS — E876 Hypokalemia: Secondary | ICD-10-CM | POA: Diagnosis not present

## 2017-04-10 DIAGNOSIS — Z96612 Presence of left artificial shoulder joint: Secondary | ICD-10-CM | POA: Diagnosis present

## 2017-04-10 DIAGNOSIS — J9600 Acute respiratory failure, unspecified whether with hypoxia or hypercapnia: Secondary | ICD-10-CM

## 2017-04-10 DIAGNOSIS — J811 Chronic pulmonary edema: Secondary | ICD-10-CM | POA: Diagnosis not present

## 2017-04-10 DIAGNOSIS — L8990 Pressure ulcer of unspecified site, unspecified stage: Secondary | ICD-10-CM

## 2017-04-10 DIAGNOSIS — R05 Cough: Secondary | ICD-10-CM

## 2017-04-10 DIAGNOSIS — R52 Pain, unspecified: Secondary | ICD-10-CM | POA: Diagnosis not present

## 2017-04-10 DIAGNOSIS — Z8249 Family history of ischemic heart disease and other diseases of the circulatory system: Secondary | ICD-10-CM | POA: Diagnosis not present

## 2017-04-10 DIAGNOSIS — L89312 Pressure ulcer of right buttock, stage 2: Secondary | ICD-10-CM | POA: Diagnosis present

## 2017-04-10 DIAGNOSIS — A419 Sepsis, unspecified organism: Secondary | ICD-10-CM | POA: Diagnosis present

## 2017-04-10 DIAGNOSIS — R7881 Bacteremia: Secondary | ICD-10-CM | POA: Diagnosis not present

## 2017-04-10 DIAGNOSIS — I83028 Varicose veins of left lower extremity with ulcer other part of lower leg: Secondary | ICD-10-CM | POA: Diagnosis present

## 2017-04-10 LAB — COMPREHENSIVE METABOLIC PANEL
ALBUMIN: 4.2 g/dL (ref 3.5–5.0)
ALT: 29 U/L (ref 14–54)
AST: 16 U/L (ref 15–41)
Alkaline Phosphatase: 67 U/L (ref 38–126)
Anion gap: 10 (ref 5–15)
BILIRUBIN TOTAL: 1.6 mg/dL — AB (ref 0.3–1.2)
BUN: 26 mg/dL — AB (ref 6–20)
CHLORIDE: 99 mmol/L — AB (ref 101–111)
CO2: 24 mmol/L (ref 22–32)
CREATININE: 0.85 mg/dL (ref 0.44–1.00)
Calcium: 9.4 mg/dL (ref 8.9–10.3)
GFR calc Af Amer: >60 mL/min (ref >60)
GLUCOSE: 217 mg/dL — AB (ref 65–99)
Potassium: 4.6 mmol/L (ref 3.5–5.1)
SODIUM: 133 mmol/L — AB (ref 135–145)
Total Protein: 7.6 g/dL (ref 6.5–8.1)

## 2017-04-10 LAB — CBC WITH DIFFERENTIAL/PLATELET
BASOS ABS: 0.1 10*3/uL (ref 0–0.1)
Basophils Relative: 0 %
EOS PCT: 0 %
Eosinophils Absolute: 0 10*3/uL (ref 0–0.7)
HEMATOCRIT: 44 % (ref 35.0–47.0)
Hemoglobin: 14.2 g/dL (ref 12.0–16.0)
Lymphocytes Relative: 5 %
Lymphs Abs: 1.1 10*3/uL (ref 1.0–3.6)
MCH: 30.4 pg (ref 26.0–34.0)
MCHC: 32.4 g/dL (ref 32.0–36.0)
MCV: 93.8 fL (ref 80.0–100.0)
MONO ABS: 1.3 10*3/uL — AB (ref 0.2–0.9)
MONOS PCT: 6 %
NEUTROS ABS: 18.5 10*3/uL — AB (ref 1.4–6.5)
Neutrophils Relative %: 89 %
PLATELETS: 448 10*3/uL — AB (ref 150–440)
RBC: 4.69 MIL/uL (ref 3.80–5.20)
RDW: 15 % — AB (ref 11.5–14.5)
WBC: 21 10*3/uL — ABNORMAL HIGH (ref 3.6–11.0)

## 2017-04-10 LAB — BLOOD CULTURE ID PANEL (REFLEXED)
Acinetobacter baumannii: NOT DETECTED
CANDIDA ALBICANS: NOT DETECTED
CANDIDA PARAPSILOSIS: NOT DETECTED
CANDIDA TROPICALIS: NOT DETECTED
Candida glabrata: NOT DETECTED
Candida krusei: NOT DETECTED
ENTEROBACTERIACEAE SPECIES: NOT DETECTED
Enterobacter cloacae complex: NOT DETECTED
Enterococcus species: NOT DETECTED
Escherichia coli: NOT DETECTED
HAEMOPHILUS INFLUENZAE: NOT DETECTED
KLEBSIELLA OXYTOCA: NOT DETECTED
KLEBSIELLA PNEUMONIAE: NOT DETECTED
Listeria monocytogenes: NOT DETECTED
METHICILLIN RESISTANCE: DETECTED — AB
NEISSERIA MENINGITIDIS: NOT DETECTED
Proteus species: NOT DETECTED
Pseudomonas aeruginosa: NOT DETECTED
SERRATIA MARCESCENS: NOT DETECTED
STAPHYLOCOCCUS AUREUS BCID: DETECTED — AB
STAPHYLOCOCCUS SPECIES: DETECTED — AB
STREPTOCOCCUS PYOGENES: NOT DETECTED
STREPTOCOCCUS SPECIES: NOT DETECTED
Streptococcus agalactiae: NOT DETECTED
Streptococcus pneumoniae: NOT DETECTED

## 2017-04-10 LAB — C-REACTIVE PROTEIN: CRP: 11.1 mg/dL — AB (ref <1.0)

## 2017-04-10 LAB — URINALYSIS, COMPLETE (UACMP) WITH MICROSCOPIC
BILIRUBIN URINE: NEGATIVE
Glucose, UA: NEGATIVE mg/dL
Hgb urine dipstick: NEGATIVE
KETONES UR: NEGATIVE mg/dL
LEUKOCYTES UA: NEGATIVE
Nitrite: NEGATIVE
Protein, ur: 100 mg/dL — AB
SPECIFIC GRAVITY, URINE: 1.027 (ref 1.005–1.030)
pH: 5 (ref 5.0–8.0)

## 2017-04-10 LAB — LACTIC ACID, PLASMA: LACTIC ACID, VENOUS: 1.5 mmol/L (ref 0.5–1.9)

## 2017-04-10 LAB — PROTIME-INR
INR: 0.91
Prothrombin Time: 12.2 seconds (ref 11.4–15.2)

## 2017-04-10 LAB — GLUCOSE, CAPILLARY
GLUCOSE-CAPILLARY: 179 mg/dL — AB (ref 65–99)
GLUCOSE-CAPILLARY: 174 mg/dL — AB (ref 65–99)
Glucose-Capillary: 172 mg/dL — ABNORMAL HIGH (ref 65–99)

## 2017-04-10 LAB — BRAIN NATRIURETIC PEPTIDE: B NATRIURETIC PEPTIDE 5: 27 pg/mL (ref 0.0–100.0)

## 2017-04-10 LAB — TROPONIN I: Troponin I: <0.03 ng/mL (ref <0.03)

## 2017-04-10 LAB — SEDIMENTATION RATE: SED RATE: 8 mm/h (ref 0–30)

## 2017-04-10 MED ORDER — SODIUM CHLORIDE 0.9 % IV BOLUS (SEPSIS): 1000.0000 mL | Freq: Once | INTRAVENOUS | Status: DC

## 2017-04-10 MED ORDER — GABAPENTIN 300 MG PO CAPS
600.0000 mg | ORAL_CAPSULE | Freq: Four times a day (QID) | ORAL | Status: DC
  Administered 2017-04-10 – 2017-04-12 (×3): 600 mg via ORAL
  Filled 2017-04-10 (×4): qty 2

## 2017-04-10 MED ORDER — AMLODIPINE BESYLATE 10 MG PO TABS
10.0000 mg | ORAL_TABLET | Freq: Every day | ORAL | Status: DC
  Administered 2017-04-10: 10 mg via ORAL
  Filled 2017-04-10 (×2): qty 1

## 2017-04-10 MED ORDER — MORPHINE SULFATE (PF) 4 MG/ML IV SOLN
INTRAVENOUS | Status: AC
  Filled 2017-04-10: qty 1

## 2017-04-10 MED ORDER — INSULIN ASPART 100 UNIT/ML ~~LOC~~ SOLN
0.0000 [IU] | Freq: Three times a day (TID) | SUBCUTANEOUS | Status: DC
  Administered 2017-04-10 – 2017-04-11 (×2): 4 [IU] via SUBCUTANEOUS
  Administered 2017-04-11: 3 [IU] via SUBCUTANEOUS
  Administered 2017-04-12 – 2017-04-14 (×7): 4 [IU] via SUBCUTANEOUS
  Administered 2017-04-14: 3 [IU] via SUBCUTANEOUS
  Administered 2017-04-14: 4 [IU] via SUBCUTANEOUS
  Administered 2017-04-15: 3 [IU] via SUBCUTANEOUS
  Administered 2017-04-15: 7 [IU] via SUBCUTANEOUS
  Administered 2017-04-15: 4 [IU] via SUBCUTANEOUS
  Administered 2017-04-16: 3 [IU] via SUBCUTANEOUS
  Administered 2017-04-16 (×2): 4 [IU] via SUBCUTANEOUS
  Administered 2017-04-17: 3 [IU] via SUBCUTANEOUS
  Administered 2017-04-17: 4 [IU] via SUBCUTANEOUS
  Administered 2017-04-17 – 2017-04-18 (×3): 3 [IU] via SUBCUTANEOUS
  Filled 2017-04-10 (×24): qty 1

## 2017-04-10 MED ORDER — MORPHINE SULFATE (PF) 2 MG/ML IV SOLN
INTRAVENOUS | Status: AC
  Filled 2017-04-10: qty 1

## 2017-04-10 MED ORDER — ONDANSETRON HCL 4 MG/2ML IJ SOLN: 4.0000 mg | Freq: Four times a day (QID) | INTRAMUSCULAR | Status: DC | PRN

## 2017-04-10 MED ORDER — PIPERACILLIN-TAZOBACTAM 3.375 G IVPB
3.3750 g | Freq: Three times a day (TID) | INTRAVENOUS | Status: DC
  Administered 2017-04-10 – 2017-04-11 (×4): 3.375 g via INTRAVENOUS
  Filled 2017-04-10 (×4): qty 50

## 2017-04-10 MED ORDER — VITAMIN D 1000 UNITS PO TABS
1000.0000 [IU] | ORAL_TABLET | Freq: Every day | ORAL | Status: DC
  Administered 2017-04-10 – 2017-04-18 (×8): 1000 [IU] via ORAL
  Filled 2017-04-10 (×9): qty 1

## 2017-04-10 MED ORDER — ONDANSETRON HCL 4 MG/2ML IJ SOLN
4.0000 mg | Freq: Once | INTRAMUSCULAR | Status: AC
  Administered 2017-04-10: 4 mg via INTRAVENOUS

## 2017-04-10 MED ORDER — ASPIRIN EC 81 MG PO TBEC
81.0000 mg | DELAYED_RELEASE_TABLET | Freq: Every day | ORAL | Status: DC
  Administered 2017-04-10 – 2017-04-18 (×8): 81 mg via ORAL
  Filled 2017-04-10 (×9): qty 1

## 2017-04-10 MED ORDER — VITAMIN B-12 1000 MCG PO TABS
1000.0000 ug | ORAL_TABLET | Freq: Every day | ORAL | Status: DC
  Administered 2017-04-10 – 2017-04-18 (×8): 1000 ug via ORAL
  Filled 2017-04-10 (×9): qty 1

## 2017-04-10 MED ORDER — VANCOMYCIN HCL 10 G IV SOLR
1250.0000 mg | Freq: Once | INTRAVENOUS | Status: AC
  Administered 2017-04-10: 1250 mg via INTRAVENOUS
  Filled 2017-04-10: qty 1250

## 2017-04-10 MED ORDER — FENTANYL CITRATE (PF) 100 MCG/2ML IJ SOLN
75.0000 ug | Freq: Once | INTRAMUSCULAR | Status: AC
  Administered 2017-04-10: 75 ug via INTRAVENOUS

## 2017-04-10 MED ORDER — INSULIN ASPART 100 UNIT/ML ~~LOC~~ SOLN: 0.0000 [IU] | Freq: Every day | SUBCUTANEOUS | Status: DC

## 2017-04-10 MED ORDER — ALBUTEROL SULFATE (2.5 MG/3ML) 0.083% IN NEBU: 2.5000 mg | INHALATION_SOLUTION | Freq: Four times a day (QID) | RESPIRATORY_TRACT | Status: DC | PRN

## 2017-04-10 MED ORDER — SODIUM CHLORIDE 0.9 % IV SOLN
INTRAVENOUS | Status: DC
  Administered 2017-04-10 – 2017-04-11 (×2): via INTRAVENOUS

## 2017-04-10 MED ORDER — VANCOMYCIN HCL IN DEXTROSE 1-5 GM/200ML-% IV SOLN
1000.0000 mg | Freq: Once | INTRAVENOUS | Status: AC
  Administered 2017-04-10: 1000 mg via INTRAVENOUS
  Filled 2017-04-10: qty 200

## 2017-04-10 MED ORDER — ACETAMINOPHEN 325 MG PO TABS: 650.0000 mg | ORAL_TABLET | Freq: Once | ORAL | Status: DC

## 2017-04-10 MED ORDER — ALBUTEROL SULFATE HFA 108 (90 BASE) MCG/ACT IN AERS: 2.0000 | INHALATION_SPRAY | Freq: Four times a day (QID) | RESPIRATORY_TRACT | Status: DC | PRN

## 2017-04-10 MED ORDER — FENTANYL CITRATE (PF) 100 MCG/2ML IJ SOLN
INTRAMUSCULAR | Status: AC
  Administered 2017-04-10: 75 ug via INTRAVENOUS
  Filled 2017-04-10: qty 2

## 2017-04-10 MED ORDER — SODIUM CHLORIDE 0.9 % IV BOLUS (SEPSIS)
1000.0000 mL | Freq: Once | INTRAVENOUS | Status: AC
  Administered 2017-04-10: 1000 mL via INTRAVENOUS

## 2017-04-10 MED ORDER — ONDANSETRON HCL 4 MG/2ML IJ SOLN
INTRAMUSCULAR | Status: AC
  Filled 2017-04-10: qty 2

## 2017-04-10 MED ORDER — OXYCODONE HCL 5 MG PO TABS
5.0000 mg | ORAL_TABLET | ORAL | Status: DC | PRN
  Administered 2017-04-10 – 2017-04-13 (×6): 5 mg via ORAL
  Filled 2017-04-10 (×7): qty 1

## 2017-04-10 MED ORDER — ADULT MULTIVITAMIN W/MINERALS CH
1.0000 | ORAL_TABLET | Freq: Every day | ORAL | Status: DC
  Administered 2017-04-10 – 2017-04-15 (×5): 1 via ORAL
  Filled 2017-04-10 (×6): qty 1

## 2017-04-10 MED ORDER — OXYBUTYNIN CHLORIDE 5 MG PO TABS
5.0000 mg | ORAL_TABLET | Freq: Two times a day (BID) | ORAL | Status: DC
  Administered 2017-04-10 – 2017-04-18 (×15): 5 mg via ORAL
  Filled 2017-04-10 (×18): qty 1

## 2017-04-10 MED ORDER — IOPAMIDOL (ISOVUE-300) INJECTION 61%
100.0000 mL | Freq: Once | INTRAVENOUS | Status: AC | PRN
  Administered 2017-04-10: 100 mL via INTRAVENOUS

## 2017-04-10 MED ORDER — MORPHINE SULFATE (PF) 2 MG/ML IV SOLN
2.0000 mg | INTRAVENOUS | Status: DC | PRN
  Administered 2017-04-10: 2 mg via INTRAVENOUS
  Filled 2017-04-10: qty 1

## 2017-04-10 MED ORDER — ACETAMINOPHEN 325 MG PO TABS
650.0000 mg | ORAL_TABLET | Freq: Four times a day (QID) | ORAL | Status: DC | PRN
  Administered 2017-04-10 (×2): 650 mg via ORAL
  Filled 2017-04-10 (×2): qty 2

## 2017-04-10 MED ORDER — MORPHINE SULFATE (PF) 2 MG/ML IV SOLN
2.0000 mg | Freq: Once | INTRAVENOUS | Status: AC
  Administered 2017-04-10: 2 mg via INTRAVENOUS

## 2017-04-10 MED ORDER — IOPAMIDOL (ISOVUE-300) INJECTION 61%
30.0000 mL | Freq: Once | INTRAVENOUS | Status: AC | PRN
  Administered 2017-04-10: 30 mL via ORAL

## 2017-04-10 MED ORDER — ONDANSETRON HCL 4 MG PO TABS: 4.0000 mg | ORAL_TABLET | Freq: Four times a day (QID) | ORAL | Status: DC | PRN

## 2017-04-10 MED ORDER — MORPHINE SULFATE (PF) 4 MG/ML IV SOLN
4.0000 mg | Freq: Once | INTRAVENOUS | Status: AC
  Administered 2017-04-10: 4 mg via INTRAVENOUS

## 2017-04-10 MED ORDER — ACETAMINOPHEN 650 MG RE SUPP
650.0000 mg | Freq: Four times a day (QID) | RECTAL | Status: DC | PRN
  Administered 2017-04-11 – 2017-04-12 (×2): 650 mg via RECTAL
  Filled 2017-04-10 (×2): qty 1

## 2017-04-10 MED ORDER — LORAZEPAM 2 MG/ML IJ SOLN
0.5000 mg | Freq: Once | INTRAMUSCULAR | Status: AC
  Administered 2017-04-10: 0.5 mg via INTRAVENOUS
  Filled 2017-04-10: qty 1

## 2017-04-10 MED ORDER — ACETAMINOPHEN 500 MG PO TABS
1000.0000 mg | ORAL_TABLET | Freq: Once | ORAL | Status: AC
  Administered 2017-04-10: 1000 mg via ORAL

## 2017-04-10 MED ORDER — ENOXAPARIN SODIUM 40 MG/0.4ML ~~LOC~~ SOLN
40.0000 mg | SUBCUTANEOUS | Status: DC
  Administered 2017-04-10 – 2017-04-12 (×3): 40 mg via SUBCUTANEOUS
  Filled 2017-04-10 (×3): qty 0.4

## 2017-04-10 MED ORDER — ACETAMINOPHEN 500 MG PO TABS
ORAL_TABLET | ORAL | Status: AC
  Filled 2017-04-10: qty 2

## 2017-04-10 MED ORDER — ATENOLOL 25 MG PO TABS
25.0000 mg | ORAL_TABLET | Freq: Every day | ORAL | Status: DC
  Administered 2017-04-10: 25 mg via ORAL
  Filled 2017-04-10 (×2): qty 1

## 2017-04-10 MED ORDER — VANCOMYCIN HCL 10 G IV SOLR
1250.0000 mg | Freq: Two times a day (BID) | INTRAVENOUS | Status: DC
  Administered 2017-04-11 – 2017-04-12 (×3): 1250 mg via INTRAVENOUS
  Filled 2017-04-10 (×5): qty 1250

## 2017-04-10 MED ORDER — PIPERACILLIN-TAZOBACTAM 3.375 G IVPB 30 MIN
3.3750 g | Freq: Once | INTRAVENOUS | Status: AC
  Administered 2017-04-10: 3.375 g via INTRAVENOUS
  Filled 2017-04-10: qty 50

## 2017-04-10 NOTE — ED Notes (Signed)
Pain management ineffective at this time. Pt continues to moan out in pain, MD made aware. See MAR.

## 2017-04-10 NOTE — ED Notes (Signed)
Dorthula Rue, rn attempt iv initiation x2 without success.

## 2017-04-10 NOTE — Progress Notes (Signed)
Admission:  Patient alert and oriented to person and place. Patient has some intermittent confusion. Patient drowsy by easy to arouse. On skin assessment patient has stage 2 on right buttock and cracked skin around that area, pressure ulcers on left lower leg, left ankle, and right heel. RN assessed areas and covered with foam dressing. [place] nurse consult has been ordered. RN rotating patient every 2 hours. Patient complaining of pain 10 out of 10 in lower back. Patient states she live stays home with husband and she is wheel chair bound. Patient denies having any home health aide. Patient oriented to call bell system.   Deri Fuelling, RN

## 2017-04-10 NOTE — ED Provider Notes (Signed)
[facility] Emergency Department Provider Note ____________________________________________   I have reviewed the triage vital signs and the triage nursing note.  HISTORY  Chief Complaint Back Pain   Historian Patient  HPI Susan Pratt is a 76 y.o. female presenting with a complaint of back spasms and pain which is moderate to severe.  She also says that she is having spasms she has had cholecystectomy and appendectomy.  States UTI in the remote history, nothing recent.  She did not.  She had a fever.  Mild nausea without vomiting.  Symptoms started this morning are moderate to severe.  Nothing seems to make it worse or better.   Past Medical History:  Diagnosis Date  . Arthritis    ra  . Bronchospastic airway disease   . Cancer (HCC)    cervical  . Cataract   . Diabetes mellitus without complication ([place])   . 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 Hyperlipidemia   . Hypertension   . 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 Neuropathy    peripheral  . PE (pulmonary embolism)    2015    Patient Active Problem List   Diagnosis Date Noted  . 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 Arthritis of shoulder region, degenerative 10/10/2015  . Rotator cuff arthropathy 10/10/2015  . Acute bronchospasm 08/22/2015  . Vitamin D deficiency 07/04/2015  . Vitamin B12 deficiency 07/04/2015  . Hx pulmonary embolism 07/04/2015  . Bronchospastic airway disease 06/20/2015  . Rheumatoid arthritis ([place]) 06/19/2015  . Obesity, Class II, BMI 35-39.9 06/19/2015  . At risk for falling 06/18/2015  . Calcification of bronchus or trachea 01/24/2014  . Hx of cancer of endometrium 11/09/2012  . Hyperlipidemia 11/09/2012  . Incomplete bladder emptying 02/01/2012  . Mixed incontinence 02/01/2012  . Type 2 diabetes, controlled, with neuropathy ([place]) 01/12/2011  . Generalized OA 01/12/2011  . Hypertension 01/12/2011   . Back ache 01/12/2011  . Peripheral nerve disease 01/12/2011    Past Surgical History:  Procedure Laterality Date  . ABDOMINAL HYSTERECTOMY    . APPENDECTOMY    . BACK SURGERY     2010  . BLADDER SURGERY     STIMULATOR  . CHOLECYSTECTOMY    . EYE SURGERY    . JOINT REPLACEMENT    . KNEE SURGERY      Prior to Admission medications   Medication Sig Start Date End Date Taking? Authorizing Provider  amLODipine (NORVASC) 10 MG tablet Take 1 tablet (10 mg total) by mouth daily. 10/08/15  Yes Plonk, Gwyndolyn Saxon, MD  aspirin EC 81 MG tablet Take 81 mg by mouth daily.   Yes [provider]  atenolol (TENORMIN) 50 MG tablet Take by mouth. 04/15/11  Yes [provider]  cholecalciferol (VITAMIN D) 1000 units tablet Take 1,000 Units by mouth daily.   Yes [provider]  gabapentin (NEURONTIN) 300 MG capsule Take 2 capsules 4 (four) times daily by mouth.  10/10/13  Yes [provider]  meloxicam (MOBIC) 15 MG tablet TAKE 1 TABLEY BY MOUTH ONCE DAILY AS NEEDED FOR PAIN 10/10/13  Yes [provider]  metFORMIN (GLUCOPHAGE) 500 MG tablet Take 1 tablet (500 mg total) by mouth 2 (two) times daily with a meal. 11/11/15  Yes Plonk, Gwyndolyn Saxon, MD  Multiple Vitamin (MULTIVITAMIN) capsule Take 1 capsule by mouth daily.   Yes [provider]  oxybutynin (DITROPAN) 5 MG tablet  Take 5 mg by mouth. 03/20/15  Yes [provider]  traMADol (ULTRAM) 50 MG tablet Take 1-2 tablets (50-100 mg total) by mouth every 6 (six) hours as needed for moderate pain. 10/24/15  Yes Reche Dixon, PA-C  vitamin B-12 (CYANOCOBALAMIN) 1000 MCG tablet Take 1,000 mcg by mouth daily.   Yes [provider]  albuterol (PROAIR HFA) 108 (90 Base) MCG/ACT inhaler Inhale into the lungs. 12/31/13   [provider]  glucose blood (BAYER CONTOUR NEXT TEST) test strip  01/19/13   [provider]  glucose blood test strip Use as instructed 10/23/15   Plonk, Gwyndolyn Saxon, MD   oxyCODONE (OXY IR/ROXICODONE) 5 MG immediate release tablet Take 1-2 tablets (5-10 mg total) by mouth every 3 (three) hours as needed for breakthrough pain. Patient not taking: Reported on 04/10/2017 10/24/15   Reche Dixon, PA-C  sulfamethoxazole-trimethoprim (BACTRIM DS,SEPTRA DS) 800-160 MG tablet Take 1 tablet by mouth 2 (two) times daily. Patient not taking: Reported on 04/10/2017 10/17/15   Adline Potter, MD    Allergies  Allergen Reactions  . Lisinopril Swelling    Causes tongue swelling    Family History  Problem Relation Age of Onset  . Cancer Mother   . Diabetes Mother   . Heart attack Father     Social History Social History   Tobacco Use  . Smoking status: Never Smoker  . Smokeless tobacco: Never Used  Substance Use Topics  . Alcohol use: No  . Drug use: No    Review of Systems  Constitutional: Negative for history of a fever. Eyes: Negative for visual changes. ENT: Negative for sore throat. Cardiovascular: Negative for chest pain per se, but upon laying flat in the stretcher reported some chest discomfort. Respiratory: Negative for shortness of breath or cough. Gastrointestinal: Negative for vomiting and diarrhea. Genitourinary: Negative for dysuria. Musculoskeletal: Positive for back pain. Skin: Negative for rash. Neurological: Negative for headache.  ____________________________________________   PHYSICAL EXAM:  VITAL SIGNS: ED Triage Vitals  Enc Vitals Group     BP 04/10/17 0616 126/69     Pulse Rate 04/10/17 0616 (!) 108     Resp 04/10/17 0616 (!) 28     Temp 04/10/17 0616 100 F (37.8 C)     Temp Source 04/10/17 0616 Oral     SpO2 04/10/17 0616 96 %     Weight 04/10/17 0615 260 lb (117.9 kg)     Height 04/10/17 0615 5\' 10"  (1.778 m)     Head Circumference --      Peak Flow --      Pain Score 04/10/17 0615 10     Pain Loc --      Pain Edu? --      Excl. in [place]? --      Constitutional: Alert and oriented. Well appearing and in no  distress.  Flushed cheeks. HEENT   Head: Normocephalic and atraumatic.      Eyes: Conjunctivae are normal. Pupils equal and round.       Ears:         Nose: No congestion/rhinnorhea.   Mouth/Throat: Mucous membranes are moist.   Neck: No stridor. Cardiovascular/Chest: Tachycardic rate, regular rhythm.  No murmurs, rubs, or gallops. Respiratory: Normal respiratory effort without tachypnea nor retractions. Breath sounds are clear and equal bilaterally. No wheezes/rales/rhonchi. Gastrointestinal: Soft. No distention, no guarding, no rebound.  Morbidly obese.  Mild tenderness in the lower abdomen without focal point tenderness. Genitourinary/rectal:Deferred Musculoskeletal: Nontender with normal range of motion  in all extremities. No joint effusions.  No lower extremity tenderness.  Bilateral 2+ lower extremity pitting edema. Neurologic:  Normal speech and language. No gross or focal neurologic deficits are appreciated. Skin:  Skin is warm, dry and intact. No rash noted. Psychiatric: Mood and affect are normal. Speech and behavior are normal. Patient exhibits appropriate insight and judgment.   ____________________________________________  LABS (pertinent positives/negatives) I, Lisa Roca, MD the attending physician have reviewed the labs noted below.  Labs Reviewed  URINALYSIS, COMPLETE (UACMP) WITH MICROSCOPIC - Abnormal; Notable for the following components:      Result Value   Color, Urine AMBER (*)    APPearance HAZY (*)    Protein, ur 100 (*)    Bacteria, UA RARE (*)    Squamous Epithelial / LPF 0-5 (*)    All other components within normal limits  COMPREHENSIVE METABOLIC PANEL - Abnormal; Notable for the following components:   Sodium 133 (*)    Chloride 99 (*)    Glucose, Bld 217 (*)    BUN 26 (*)    Total Bilirubin 1.6 (*)    All other components within normal limits  CBC WITH DIFFERENTIAL/PLATELET - Abnormal; Notable for the following components:   WBC 21.0  (*)    RDW 15.0 (*)    Platelets 448 (*)    Neutro Abs 18.5 (*)    Monocytes Absolute 1.3 (*)    All other components within normal limits  CULTURE, BLOOD (ROUTINE X 2)  CULTURE, BLOOD (ROUTINE X 2)  URINE CULTURE  LACTIC ACID, PLASMA  PROTIME-INR  TROPONIN I  BRAIN NATRIURETIC PEPTIDE    ____________________________________________    EKG I, Lisa Roca, MD, the attending physician have personally viewed and interpreted all ECGs.  104 bpm.  Sinus tachycardia.  Nonspecific intraventricular conduction delay.  The left axis deviation.  Nonspecific ST and T wave ____________________________________________  RADIOLOGY All Xrays were viewed by me.  Imaging interpreted by Radiologist, and I, Lisa Roca, MD the attending physician have reviewed the radiologist interpretation noted below.  Chest x-ray portable:  IMPRESSION: Mildly enlarged heart.  Calcific atherosclerotic disease and tortuosity of the aorta.  Persistent mild bilateral pleural thickening.  CT abd pel w:  IMPRESSION: 1. Urinary bladder wall thickness is normal. No renal or ureteral calculus. No hydronephrosis.  2. No bowel obstruction. No abscess. Appendix absent.  3. Widespread thoracic and lumbar arthropathy. Extensive bony overgrowth at multiple sites.  4. Aortoiliac atherosclerosis. No aneurysm. No mesenteric ischemia appreciable by CT.  5. Prominent liver without focal lesion evident. Mild prominence of the common bile duct evident without mass or calculus evident within the biliary ductal system. Gallbladder absent.  6. Evidence a degree of vaginal prolapse. Uterus absent.  7. Stimulator in the posterior right pelvic region with stimulator lead extending into the region of the right sacral plexus.  8. Small ventral hernia containing only fat.  9. Muscle atrophy left proximal thigh region.  Aortic Atherosclerosis  (ICD10-I70.0). __________________________________________  PROCEDURES  Procedure(s) performed: None  Critical Care performed: CRITICAL CARE Performed by: Lisa Roca   Total critical care time: 30 minutes  Critical care time was exclusive of separately billable procedures and treating other patients.  Critical care was necessary to treat or prevent imminent or life-threatening deterioration.  Critical care was time spent personally by me on the following activities: development of treatment plan with patient and/or surrogate as well as nursing, discussions with consultants, evaluation of patient's response to treatment, examination of patient, obtaining  history from patient or surrogate, ordering and performing treatments and interventions, ordering and review of laboratory studies, ordering and review of radiographic studies, pulse oximetry and re-evaluation of patient's condition.    ____________________________________________  ED COURSE / ASSESSMENT AND PLAN  Pertinent labs & imaging results that were available during my care of the patient were reviewed by me and considered in my medical decision making (see chart for details).   Susan Pratt was found to be febrile with a complaint of lower abdomen and back pain/spasms.  Concern for sepsis possibly from UTI given location of discomfort.  Initiated fluid bolus, although patient does have lower extremity edema, and want to be careful not to volume overload as well.  UA not consistent with UTI.  Covered broad spectrum antibiotics for possible sepsis due to fever, elevated hr and elevated wbc with uncertain source.  Chest x-ray clear for no pneumonia.  Patient was still complaining and moaning and writhing about low back pain.  In some respects her writhing reminds me of possibility for kidney stone, however there is no blood in her urine.  Wishes to proceed with CT scan of the abdomen pelvis. Patient received 3 doses of  morphine and was still feeling like she had no relief at all.  Was initially trying to go relatively slow with narcotics so as not to induce respiratory depression, etc.  Patient then switched to fentanyl, and given that she was still in severe pain, received 75 mg.  Shortly after that she received essentially complete pain relief, but did drop her O2 sats and require oxygen at 2 L to maintain sats in the 90s from narcotic sedation/respiratory suppression.  CT shows no certain cause for her significant pain. Question muscle spasm as the source of the pain which is back -- will try ativan for relaxation and see if that helps more.  Significant other states patient does not typically have this amount of pain with any chronic back pain.   She had not discussed the pelvic stimulator that she has in place - it is a bladder stimulator.   Heart rate and fever down, however I am going to go ahead discussed with hospitalist for admission for possible sepsis unknown source.   DIFFERENTIAL DIAGNOSIS: Including but not limited to sepsis, MI, PE, diverticulitis, aortic aneurysm, urinary tract infection/pyelonephritis, pneumonia, etc.  CONSULTATIONS: Hospitalist for admission.   Patient / Family / Caregiver informed of clinical course, medical decision-making process, and agree with plan.   ___________________________________________  ED Discharge Orders    None      ___________________________________________   FINAL CLINICAL IMPRESSION(S) / ED DIAGNOSES   Final diagnoses:  Sepsis, due to unspecified organism [facility])              Note: This dictation was prepared with Dragon dictation. Any transcriptional errors that result from this process are unintentional    Lisa Roca, MD 04/10/17 1130

## 2017-04-10 NOTE — ED Notes (Signed)
Admitting MD at bedside.

## 2017-04-10 NOTE — H&P (Signed)
Susan Pratt is an 76 y.o. female.   Chief Complaint: Back and hip pain. HPI: This is 76 year old female. She has a history of chronic back pain. She's been having left hip pain. She had a steroid injection a left hip last Thursday as an outpatient. She is unable to give me much history She is fairly confused. Her husband is with her says yesterday she was getting up off the commode and tripped over the dryer. She started having a lot more pain then. He said she started getting confused today and had fever yesterday. She hadn't had nausea but no vomiting. She was tender in her abdomen when the ER examine her neck got up abdominal CT which shows no abnormalities. She is febrile and tachycardic. Hospital services were consulted for admission.  Past Medical History:  Diagnosis Date  . Arthritis    ra  . Bronchospastic airway disease   . Cancer (HCC)    cervical  . Cataract   . Diabetes mellitus without complication ([place])   . 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 Hyperlipidemia   . Hypertension   . 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 Neuropathy    peripheral  . PE (pulmonary embolism)    2015    Past Surgical History:  Procedure Laterality Date  . ABDOMINAL HYSTERECTOMY    . APPENDECTOMY    . BACK SURGERY     2010  . BLADDER SURGERY     STIMULATOR  . CHOLECYSTECTOMY    . EYE SURGERY    . JOINT REPLACEMENT    . KNEE SURGERY      Family History  Problem Relation Age of Onset  . Cancer Mother   . Diabetes Mother   . Heart attack Father    Social History:  reports that  has never smoked. she has never used smokeless tobacco. She reports that she does not drink alcohol or use drugs.  Allergies:  Allergies  Allergen Reactions  . Lisinopril Swelling    Causes tongue swelling     (Not in a hospital admission)  Results for orders placed or performed during the hospital encounter of 04/10/17 (from the past 48 hour(s))  Urinalysis,  Complete w Microscopic     Status: Abnormal   Collection Time: 04/10/17  6:40 AM  Result Value Ref Range   Color, Urine AMBER (A) YELLOW    Comment: BIOCHEMICALS MAY BE AFFECTED BY COLOR   APPearance HAZY (A) CLEAR   Specific Gravity, Urine 1.027 1.005 - 1.030   pH 5.0 5.0 - 8.0   Glucose, UA NEGATIVE NEGATIVE mg/dL   Hgb urine dipstick NEGATIVE NEGATIVE   Bilirubin Urine NEGATIVE NEGATIVE   Ketones, ur NEGATIVE NEGATIVE mg/dL   Protein, ur 100 (A) NEGATIVE mg/dL   Nitrite NEGATIVE NEGATIVE   Leukocytes, UA NEGATIVE NEGATIVE   RBC / HPF 0-5 0 - 5 RBC/hpf   WBC, UA 0-5 0 - 5 WBC/hpf   Bacteria, UA RARE (A) NONE SEEN   Squamous Epithelial / LPF 0-5 (A) NONE SEEN   Mucus PRESENT    Hyaline Casts, UA PRESENT   Comprehensive metabolic panel     Status: Abnormal   Collection Time: 04/10/17  6:40 AM  Result Value Ref Range   Sodium 133 (L) 135 - 145 mmol/L   Potassium 4.6 3.5 - 5.1 mmol/L   Chloride 99 (L) 101 - 111 mmol/L   CO2 24 22 -  32 mmol/L   Glucose, Bld 217 (H) 65 - 99 mg/dL   BUN 26 (H) 6 - 20 mg/dL   Creatinine, Ser 0.85 0.44 - 1.00 mg/dL   Calcium 9.4 8.9 - 10.3 mg/dL   Total Protein 7.6 6.5 - 8.1 g/dL   Albumin 4.2 3.5 - 5.0 g/dL   AST 16 15 - 41 U/L   ALT 29 14 - 54 U/L   Alkaline Phosphatase 67 38 - 126 U/L   Total Bilirubin 1.6 (H) 0.3 - 1.2 mg/dL   GFR calc non Af Amer >60 >60 mL/min   GFR calc Af Amer >60 >60 mL/min    Comment: (NOTE) The eGFR has been calculated using the CKD EPI equation. This calculation has not been validated in all clinical situations. eGFR's persistently <60 mL/min signify possible Chronic Kidney Disease.    Anion gap 10 5 - 15  Lactic acid, plasma     Status: None   Collection Time: 04/10/17  6:40 AM  Result Value Ref Range   Lactic Acid, Venous 1.5 0.5 - 1.9 mmol/L  CBC with Differential     Status: Abnormal   Collection Time: 04/10/17  6:40 AM  Result Value Ref Range   WBC 21.0 (H) 3.6 - 11.0 K/uL   RBC 4.69 3.80 - 5.20 MIL/uL    Hemoglobin 14.2 12.0 - 16.0 g/dL   HCT 44.0 35.0 - 47.0 %   MCV 93.8 80.0 - 100.0 fL   MCH 30.4 26.0 - 34.0 pg   MCHC 32.4 32.0 - 36.0 g/dL   RDW 15.0 (H) 11.5 - 14.5 %   Platelets 448 (H) 150 - 440 K/uL   Neutrophils Relative % 89 %   Neutro Abs 18.5 (H) 1.4 - 6.5 K/uL   Lymphocytes Relative 5 %   Lymphs Abs 1.1 1.0 - 3.6 K/uL   Monocytes Relative 6 %   Monocytes Absolute 1.3 (H) 0.2 - 0.9 K/uL   Eosinophils Relative 0 %   Eosinophils Absolute 0.0 0 - 0.7 K/uL   Basophils Relative 0 %   Basophils Absolute 0.1 0 - 0.1 K/uL  Protime-INR     Status: None   Collection Time: 04/10/17  6:40 AM  Result Value Ref Range   Prothrombin Time 12.2 11.4 - 15.2 seconds   INR 0.91   Troponin I     Status: None   Collection Time: 04/10/17  6:40 AM  Result Value Ref Range   Troponin I <0.03 <0.03 ng/mL  Brain natriuretic peptide     Status: None   Collection Time: 04/10/17  6:40 AM  Result Value Ref Range   B Natriuretic Peptide 27.0 0.0 - 100.0 pg/mL   Ct Abdomen Pelvis W Contrast  Result Date: 04/10/2017 CLINICAL DATA:  Abdominal and back pain with nausea.  Dysuria. EXAM: CT ABDOMEN AND PELVIS WITH CONTRAST TECHNIQUE: Multidetector CT imaging of the abdomen and pelvis was performed using the standard protocol following bolus administration of intravenous contrast. CONTRAST:  126m ISOVUE-300 IOPAMIDOL (ISOVUE-300) INJECTION 61% COMPARISON:  February 08, 2008 FINDINGS: Lower chest: There is atelectatic change in the lung bases, more on the left than on the right. Heart appears prominent. There are foci of coronary artery calcification. Hepatobiliary: Liver measures 19.8 cm in length. No focal liver lesions are appreciable. Gallbladder is absent. The common bile duct measures 11 mm, mildly enlarged. No biliary duct mass or calculus evident. Pancreas: No pancreatic mass or inflammatory focus evident. Spleen: No splenic lesions are evident. Adrenals/Urinary Tract: Adrenals  appear unremarkable.  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 There is a cyst arising from the medial left kidney lower pole region measuring 1.9 x 1.3 cm. There are parapelvic cysts in the left kidney, largest measuring 1.5 x 1.5 cm. There is no appreciable hydronephrosis on either side. There is no renal or ureteral calculus on either side. Urinary bladder is midline with wall thickness within normal limits. Stomach/Bowel: There is no appreciable bowel wall or mesenteric thickening. There is moderate stool throughout the colon. There is no bowel obstruction. No bowel pneumatosis. There is no free air or portal venous air. Vascular/Lymphatic: There is atherosclerotic calcification in the aorta and iliac arteries. Aorta is tortuous. No aneurysm. Major mesenteric vessels appear patent. No adenopathy is evident in the abdomen or pelvis. Reproductive: Uterus is absent. There appears to be a degree of vaginal prolapse. No pelvic mass evident. Other: Appendix absent. No abscess or ascites is appreciable in the abdomen or pelvis. There is a small ventral hernia containing only fat. A stimulator is noted posteriorly on the right. Lead from the stimulator is seen anterior to the lower sacrum on the right in the right sacral plexus region. Musculoskeletal: There is extensive degenerative type change throughout the lower thoracic and lumbar spine regions. There are no blastic or lytic bone lesions. There is fatty infiltration throughout the gluteal muscles. There is atrophy of the proximal thigh muscles laterally on the left. IMPRESSION: 1. Urinary bladder wall thickness is normal. No renal or ureteral calculus. No hydronephrosis. 2.  No bowel obstruction.  No abscess.  Appendix absent. 3. Widespread thoracic and lumbar arthropathy. Extensive bony overgrowth at multiple sites. 4. Aortoiliac atherosclerosis. No aneurysm. No mesenteric ischemia appreciable by CT. 5. Prominent liver without focal lesion evident.  Mild prominence of the common bile duct evident without mass or calculus evident within the biliary ductal system. Gallbladder absent. 6.  Evidence a degree of vaginal prolapse.  Uterus absent. 7. Stimulator in the posterior right pelvic region with stimulator lead extending into the region of the right sacral plexus. 8.  Small ventral hernia containing only fat. 9.  Muscle atrophy left proximal thigh region. Aortic Atherosclerosis (ICD10-I70.0). Electronically Signed   By: Lowella Grip III M.D.   On: 04/10/2017 10:05   Dg Chest Port 1 View  Result Date: 04/10/2017 CLINICAL DATA:  Back pain.  History of pulmonary embolus. EXAM: PORTABLE CHEST 1 VIEW COMPARISON:  Chest CT 09/04/2012 FINDINGS: The cardiac silhouette is enlarged. Mediastinal contours appear intact. Tortuosity and atherosclerotic disease of the aorta. There is no evidence of focal airspace consolidation, pleural effusion or pneumothorax. Mild bilateral lower lobe atelectasis. Mild bilateral pleural thickening, stable. Osseous structures are without acute abnormality. Soft tissues are grossly normal. IMPRESSION: Mildly enlarged heart. Calcific atherosclerotic disease and tortuosity of the aorta. Persistent mild bilateral pleural thickening. Electronically Signed   By: Fidela Salisbury M.D.   On: 04/10/2017 07:37    ROS  Blood pressure (!) 157/79, pulse 89, temperature 98.7 F (37.1 C), resp. rate (!) 21, height 5' 10" (1.778 m), weight 117.9 kg (260 lb), SpO2 98 %. Physical Exam   Assessment/Plan 1. Sepsis. Source is unknown at this time. We'll go and cover with broad-spectrum antibiotics and give her supportive care with IV fluids. Blood cultures are pending. 2. Metabolic encephalopathy. Likely secondary to the sepsis. Should clear with treatment of cause. We'll monitor her. 3. Left hip bursitis. As noted above she had a steroid  injection on Thursday. There is some bruising around the site. CT scan of the abdomen got some cuts  down that area and do not see any obvious abscess. Although introduction of an infection to an injection is rare have to consider that she possibly have some septic arthritis. We'll discuss with orthopedics. For now continue antibiotics. 4. Chronic pain. We'll continue her current medications.   5. Diabetes. We'll hold her Glucophage since she just had a contrasted scan. Start her on some sliding scale insulin.  Baxter Hire, MD 04/10/2017, 12:23 PM

## 2017-04-10 NOTE — Progress Notes (Signed)
Pt is restless, hollering and c/o severe lower back pain this time, rating it between 9-10/10 scale despite PRN Oxycodone 5mg  PO given at 1924. Dr Jerelyn Charles paged and ordered for Morphine 2mg  IV every 3h PRN for severe pain. Will administer as ordered and continue to attend pt's needs.

## 2017-04-10 NOTE — ED Notes (Signed)
Attempt iv initiation x1 without success.  

## 2017-04-10 NOTE — ED Notes (Signed)
Dr Reita Cliche in to see pt; no order for antibiotics given at this time; MD says she wants to wait for urine results

## 2017-04-10 NOTE — Progress Notes (Signed)
PHARMACY - PHYSICIAN COMMUNICATION CRITICAL VALUE ALERT - BLOOD CULTURE IDENTIFICATION (BCID)  Results for orders placed or performed during the hospital encounter of 04/10/17  Blood Culture ID Panel (Reflexed) (Collected: 04/10/2017  6:48 AM)  Result Value Ref Range   Enterococcus species NOT DETECTED NOT DETECTED   Listeria monocytogenes NOT DETECTED NOT DETECTED   Staphylococcus species DETECTED (A) NOT DETECTED   Staphylococcus aureus DETECTED (A) NOT DETECTED   Methicillin resistance DETECTED (A) NOT DETECTED   Streptococcus species NOT DETECTED NOT DETECTED   Streptococcus agalactiae NOT DETECTED NOT DETECTED   Streptococcus pneumoniae NOT DETECTED NOT DETECTED   Streptococcus pyogenes NOT DETECTED NOT DETECTED   Acinetobacter baumannii NOT DETECTED NOT DETECTED   Enterobacteriaceae species NOT DETECTED NOT DETECTED   Enterobacter cloacae complex NOT DETECTED NOT DETECTED   Escherichia coli NOT DETECTED NOT DETECTED   Klebsiella oxytoca NOT DETECTED NOT DETECTED   Klebsiella pneumoniae NOT DETECTED NOT DETECTED   Proteus species NOT DETECTED NOT DETECTED   Serratia marcescens NOT DETECTED NOT DETECTED   Haemophilus influenzae NOT DETECTED NOT DETECTED   Neisseria meningitidis NOT DETECTED NOT DETECTED   Pseudomonas aeruginosa NOT DETECTED NOT DETECTED   Candida albicans NOT DETECTED NOT DETECTED   Candida glabrata NOT DETECTED NOT DETECTED   Candida krusei NOT DETECTED NOT DETECTED   Candida parapsilosis NOT DETECTED NOT DETECTED   Candida tropicalis NOT DETECTED NOT DETECTED    Name of physician (or Provider) Contacted: Diamond  Changes to prescribed antibiotics required: Yes, will restart Vancomycin.   Roise Emert D 04/10/2017  9:32 PM

## 2017-04-10 NOTE — ED Notes (Signed)
RN in room due to O2 alarm, PT O2 sat at 63% on room air with good wave form. PT instructed to take deep breaths with O2 sats improving to 78-83% on room air. Pt was placed on O2 via [place] at 5 Liters, pt O2 sats improved to 98-99% on 5 liters O2, pt was titrated down to 2 liters with O2 sats remaining 98-99%.  Dr. Reita Cliche notified. Langley Gauss, RN notified.

## 2017-04-10 NOTE — ED Triage Notes (Signed)
Pt states mid back pain since yesterday. Pt's skin feels slightly hot to touch. Pt moaning in pain. Pt with nausea, strong odor of foul urine noted. Pt states she is non ambulatory.

## 2017-04-10 NOTE — ED Notes (Signed)
Pt shouting in pain at times; says she's having spasms to her lower back;

## 2017-04-10 NOTE — Consult Note (Signed)
ORTHOPAEDIC CONSULTATION  REQUESTING PHYSICIAN: Baxter Hire, MD  Chief Complaint:   L hip pain  History of Present Illness: Patient able to give some history. Other history obtained from ED staff and family.  Susan Pratt is a 76 y.o. female with L hip pain in the setting of sepsis. She has had chronic pain in the L lateral aspect of the hip about her greater trochanter. She had a corticosteroid injection by Reche Dixon, PA on 03/31/17. She states that this did help her pain somewhat. She has more significant low back pain.  Per ED notes (patient unable to give full history due to mental status):  She is fairly confused. Her husband is with her says yesterday she was getting up off the commode and tripped over the dryer. She started having a lot more pain then. He said she started getting confused today and had fever yesterday. She hadn't had nausea but no vomiting. She was tender in her abdomen when the ER examine her neck got up abdominal CT which shows no abnormalities. She is febrile and tachycardic. Hospital services were consulted for admission.  I was consulted to rule out hip as an infectious source.     Past Medical History:  Diagnosis Date  . Arthritis    ra  . Bronchospastic airway disease   . Cancer (HCC)    cervical  . Cataract   . Diabetes mellitus without complication ([place])   . 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 Hyperlipidemia   . Hypertension   . 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 Neuropathy    peripheral  . PE (pulmonary embolism)    2015   Past Surgical History:  Procedure Laterality Date  . ABDOMINAL HYSTERECTOMY    . APPENDECTOMY    . BACK SURGERY     2010  . BLADDER SURGERY     STIMULATOR  . CHOLECYSTECTOMY    . EYE SURGERY    . JOINT REPLACEMENT    . KNEE SURGERY     Social History   Socioeconomic History  . Marital status: Married     Spouse name: None  . Number of children: None  . Years of education: None  . Highest education level: None  Social Needs  . Financial resource strain: None  . Food insecurity - worry: None  . Food insecurity - inability: None  . Transportation needs - medical: None  . Transportation needs - non-medical: None  Occupational History  . None  Tobacco Use  . Smoking status: Never Smoker  . Smokeless tobacco: Never Used  Substance and Sexual Activity  . Alcohol use: No  . Drug use: No  . Sexual activity: No    Birth control/protection: Other-see comments  Other Topics Concern  . None  Social History Narrative  . None   Family History  Problem Relation Age of Onset  . Cancer Mother   . Diabetes Mother   . Heart attack Father    Allergies  Allergen Reactions  . Lisinopril Swelling    Causes tongue swelling   Prior to Admission medications   Medication Sig Start Date End Date Taking? Authorizing Provider  amLODipine (NORVASC) 10 MG tablet Take 1 tablet (10 mg total) by mouth daily. 10/08/15  Yes Plonk, Gwyndolyn Saxon, MD  aspirin EC 81 MG tablet Take 81 mg by mouth daily.   Yes [provider]  atenolol (TENORMIN) 50 MG tablet  Take by mouth. 04/15/11  Yes [provider]  cholecalciferol (VITAMIN D) 1000 units tablet Take 1,000 Units by mouth daily.   Yes [provider]  gabapentin (NEURONTIN) 300 MG capsule Take 2 capsules 4 (four) times daily by mouth.  10/10/13  Yes [provider]  meloxicam (MOBIC) 15 MG tablet TAKE 1 TABLEY BY MOUTH ONCE DAILY AS NEEDED FOR PAIN 10/10/13  Yes [provider]  metFORMIN (GLUCOPHAGE) 500 MG tablet Take 1 tablet (500 mg total) by mouth 2 (two) times daily with a meal. 11/11/15  Yes Plonk, Gwyndolyn Saxon, MD  Multiple Vitamin (MULTIVITAMIN) capsule Take 1 capsule by mouth daily.   Yes [provider]  oxybutynin (DITROPAN) 5 MG tablet Take 5 mg by mouth. 03/20/15  Yes [provider]  traMADol  (ULTRAM) 50 MG tablet Take 1-2 tablets (50-100 mg total) by mouth every 6 (six) hours as needed for moderate pain. 10/24/15  Yes Reche Dixon, PA-C  vitamin B-12 (CYANOCOBALAMIN) 1000 MCG tablet Take 1,000 mcg by mouth daily.   Yes [provider]  albuterol (PROAIR HFA) 108 (90 Base) MCG/ACT inhaler Inhale into the lungs. 12/31/13   [provider]  glucose blood (BAYER CONTOUR NEXT TEST) test strip  01/19/13   [provider]  glucose blood test strip Use as instructed 10/23/15   Plonk, Gwyndolyn Saxon, MD  oxyCODONE (OXY IR/ROXICODONE) 5 MG immediate release tablet Take 1-2 tablets (5-10 mg total) by mouth every 3 (three) hours as needed for breakthrough pain. Patient not taking: Reported on 04/10/2017 10/24/15   Reche Dixon, PA-C  sulfamethoxazole-trimethoprim (BACTRIM DS,SEPTRA DS) 800-160 MG tablet Take 1 tablet by mouth 2 (two) times daily. Patient not taking: Reported on 04/10/2017 10/17/15   Adline Potter, MD   Ct Abdomen Pelvis W Contrast  Result Date: 04/10/2017 CLINICAL DATA:  Abdominal and back pain with nausea.  Dysuria. EXAM: CT ABDOMEN AND PELVIS WITH CONTRAST TECHNIQUE: Multidetector CT imaging of the abdomen and pelvis was performed using the standard protocol following bolus administration of intravenous contrast. CONTRAST:  175mL ISOVUE-300 IOPAMIDOL (ISOVUE-300) INJECTION 61% COMPARISON:  February 08, 2008 FINDINGS: Lower chest: There is atelectatic change in the lung bases, more on the left than on the right. Heart appears prominent. There are foci of coronary artery calcification. Hepatobiliary: Liver measures 19.8 cm in length. No focal liver lesions are appreciable. Gallbladder is absent. The common bile duct measures 11 mm, mildly enlarged. No biliary duct mass or calculus evident. Pancreas: No pancreatic mass or inflammatory focus evident. Spleen: No splenic lesions are evident. Adrenals/Urinary Tract: Adrenals appear unremarkable. There is angiomyolipoma in the  lower pole the right kidney measuring 7 x 7 mm. There is a cyst arising from the medial left kidney lower pole region measuring 1.9 x 1.3 cm. There are parapelvic cysts in the left kidney, largest measuring 1.5 x 1.5 cm. There is no appreciable hydronephrosis on either side. There is no renal or ureteral calculus on either side. Urinary bladder is midline with wall thickness within normal limits. Stomach/Bowel: There is no appreciable bowel wall or mesenteric thickening. There is moderate stool throughout the colon. There is no bowel obstruction. No bowel pneumatosis. There is no free air or portal venous air. Vascular/Lymphatic: There is atherosclerotic calcification in the aorta and iliac arteries. Aorta is tortuous. No aneurysm. Major mesenteric vessels appear patent. No adenopathy is evident in the abdomen or pelvis. Reproductive: Uterus is absent. There appears to be a degree of vaginal prolapse. No pelvic mass evident. Other:  Appendix absent. No abscess or ascites is appreciable in the abdomen or pelvis. There is a small ventral hernia containing only fat. A stimulator is noted posteriorly on the right. Lead from the stimulator is seen anterior to the lower sacrum on the right in the right sacral plexus region. Musculoskeletal: There is extensive degenerative type change throughout the lower thoracic and lumbar spine regions. There are no blastic or lytic bone lesions. There is fatty infiltration throughout the gluteal muscles. There is atrophy of the proximal thigh muscles laterally on the left. IMPRESSION: 1. Urinary bladder wall thickness is normal. No renal or ureteral calculus. No hydronephrosis. 2.  No bowel obstruction.  No abscess.  Appendix absent. 3. Widespread thoracic and lumbar arthropathy. Extensive bony overgrowth at multiple sites. 4. Aortoiliac atherosclerosis. No aneurysm. No mesenteric ischemia appreciable by CT. 5. Prominent liver without focal lesion evident. Mild prominence of the common  bile duct evident without mass or calculus evident within the biliary ductal system. Gallbladder absent. 6.  Evidence a degree of vaginal prolapse.  Uterus absent. 7. Stimulator in the posterior right pelvic region with stimulator lead extending into the region of the right sacral plexus. 8.  Small ventral hernia containing only fat. 9.  Muscle atrophy left proximal thigh region. Aortic Atherosclerosis (ICD10-I70.0). Electronically Signed   By: Lowella Grip III M.D.   On: 04/10/2017 10:05   Dg Chest Port 1 View  Result Date: 04/10/2017 CLINICAL DATA:  Back pain.  History of pulmonary embolus. EXAM: PORTABLE CHEST 1 VIEW COMPARISON:  Chest CT 09/04/2012 FINDINGS: The cardiac silhouette is enlarged. Mediastinal contours appear intact. Tortuosity and atherosclerotic disease of the aorta. There is no evidence of focal airspace consolidation, pleural effusion or pneumothorax. Mild bilateral lower lobe atelectasis. Mild bilateral pleural thickening, stable. Osseous structures are without acute abnormality. Soft tissues are grossly normal. IMPRESSION: Mildly enlarged heart. Calcific atherosclerotic disease and tortuosity of the aorta. Persistent mild bilateral pleural thickening. Electronically Signed   By: Fidela Salisbury M.D.   On: 04/10/2017 07:37    Positive ROS: All other systems have been reviewed and were otherwise negative with the exception of those mentioned in the HPI and as above.  Physical Exam: General:  Responds to name, intermittently alert and confused HEENT: moist mucous membranes, EOMI Cardiovascular:  Ext warm well-perfused, regular distal pulse Respiratory:  No wheezing, non-labored breathing GI:  Abdomen is soft, no rebound/guarding Skin:  No lesions in the area of chief complaint, no erythema. Neurologic:  Sensation intact distally, CN grossly intact Lymphatic:  No axillary or cervical lymphadenopathy  Orthopedic Exam:  LLE: - Able to PF/DF ankle and wiggle toes - Foot  wwp - Sensation grossly intact over foot - No significant focal tenderness to palpation over greater trochanter - RoM: passively, 90 flexion, 30 ER, 20 IR with only mild pain  X-rays:  None obtained. CT scan results as above. No notable fluid collections  Assessment: 76 yo F with back and hip pain in the setting of possible sepsis. Exam is not very concerning for septic arthritis of the hip or infected bursa. CT scan does not show any signs of abscess about the hip.   Plan 1. Continue workup and search for other etiology of sepsis as hip is not a likely source based on her clinical exam and imaging findings. 2. Will plan to re-examine tomorrow. 3. Please page with any questions.     Leim Fabry   04/10/2017 1:23 PM

## 2017-04-10 NOTE — ED Notes (Signed)
Ortho MD at bedside for assessment. Delayed transfer to inpatient called, Estill Bamberg on 1A acknowledged.

## 2017-04-10 NOTE — ED Notes (Signed)
Pt moaning out in pain, medication administered per order.

## 2017-04-10 NOTE — ED Notes (Signed)
Pt difficult stick, 1 IV successfully placed; 2nd IV not placed, MD made aware.

## 2017-04-10 NOTE — ED Notes (Signed)
Patient is resting comfortably at this time with no signs of distress present. VS stable. Will continue to monitor. Family at bedside.

## 2017-04-10 NOTE — Progress Notes (Signed)
Pharmacy Antibiotic Note  Susan Pratt is a 76 y.o. female admitted on 04/10/2017 with sepsis.  Pharmacy has been consulted for Vancomycin and Zosyn dosing. Patient received Vancomycin 1g and Zosyn 3.375g in the ED ~08:00.  Plan: Vancomycin 1250 mg IV every 12 hours.  Begin today at 15:00 (~ 7 hours from initial dose for stacked dosing). Goal trough 15-20 mcg/mL. Trough level ordered prior to 5th dose on 11/13 at 02:30.   Zosyn 3.375g IV q8h (4 hour infusion).  Height: 5\' 10"  (177.8 cm) Weight: 260 lb (117.9 kg) IBW/kg (Calculated) : 68.5  Temp (24hrs), Avg:99.8 F (37.7 C), Min:98.7 F (37.1 C), Max:100.6 F (38.1 C)  Recent Labs  Lab 04/10/17 0640  WBC 21.0*  CREATININE 0.85  LATICACIDVEN 1.5    Estimated Creatinine Clearance: 77.3 mL/min (by C-G formula based on SCr of 0.85 mg/dL).    Allergies  Allergen Reactions  . Lisinopril Swelling    Causes tongue swelling    Antimicrobials this admission: Zosyn 11/11 >>  Vancomycin 11/11 >>   Dose adjustments this admission: N/A   Microbiology results: 11/11 BCx: pending 11/11 UCx: pending   Thank you for allowing pharmacy to be a part of this patient's care.  Olivia Canter, [facility] 04/10/2017 2:11 PM

## 2017-04-11 ENCOUNTER — Inpatient Hospital Stay (HOSPITAL_COMMUNITY)
Admit: 2017-04-11 | Discharge: 2017-04-11 | Disposition: A | Discharge status: Home / Self Care | Payer: Medicare Other | Attending: Internal Medicine | Admitting: Internal Medicine

## 2017-04-11 ENCOUNTER — Inpatient Hospital Stay: Payer: Medicare Other

## 2017-04-11 DIAGNOSIS — R7881 Bacteremia: Secondary | ICD-10-CM

## 2017-04-11 DIAGNOSIS — L8990 Pressure ulcer of unspecified site, unspecified stage: Secondary | ICD-10-CM

## 2017-04-11 LAB — CBC
HEMATOCRIT: 36.1 % (ref 35.0–47.0)
Hemoglobin: 11.8 g/dL — ABNORMAL LOW (ref 12.0–16.0)
MCH: 30.6 pg (ref 26.0–34.0)
MCHC: 32.7 g/dL (ref 32.0–36.0)
MCV: 93.7 fL (ref 80.0–100.0)
Platelets: 342 10*3/uL (ref 150–440)
RBC: 3.85 MIL/uL (ref 3.80–5.20)
RDW: 14.7 % — AB (ref 11.5–14.5)
WBC: 24.6 10*3/uL — ABNORMAL HIGH (ref 3.6–11.0)

## 2017-04-11 LAB — BASIC METABOLIC PANEL
Anion gap: 8 (ref 5–15)
BUN: 19 mg/dL (ref 6–20)
CALCIUM: 8.5 mg/dL — AB (ref 8.9–10.3)
CO2: 24 mmol/L (ref 22–32)
Chloride: 101 mmol/L (ref 101–111)
Creatinine, Ser: 0.66 mg/dL (ref 0.44–1.00)
GFR calc Af Amer: >60 mL/min (ref >60)
GLUCOSE: 160 mg/dL — AB (ref 65–99)
POTASSIUM: 3.9 mmol/L (ref 3.5–5.1)
Sodium: 133 mmol/L — ABNORMAL LOW (ref 135–145)

## 2017-04-11 LAB — GLUCOSE, CAPILLARY
GLUCOSE-CAPILLARY: 117 mg/dL — AB (ref 65–99)
GLUCOSE-CAPILLARY: 155 mg/dL — AB (ref 65–99)
GLUCOSE-CAPILLARY: 135 mg/dL — AB (ref 65–99)
Glucose-Capillary: 143 mg/dL — ABNORMAL HIGH (ref 65–99)
Glucose-Capillary: 126 mg/dL — ABNORMAL HIGH (ref 65–99)

## 2017-04-11 LAB — BLOOD GAS, ARTERIAL
ACID-BASE EXCESS: 0.8 mmol/L (ref 0.0–2.0)
Bicarbonate: 24 mmol/L (ref 20.0–28.0)
FIO2: 0.28
O2 SAT: 91.1 %
PATIENT TEMPERATURE: 39
PCO2 ART: 33 mmHg (ref 32.0–48.0)
pH, Arterial: 7.44 (ref 7.350–7.450)
pO2, Arterial: 66 mmHg — ABNORMAL LOW (ref 83.0–108.0)

## 2017-04-11 LAB — PHOSPHORUS: Phosphorus: 2.2 mg/dL — ABNORMAL LOW (ref 2.5–4.6)

## 2017-04-11 LAB — PROCALCITONIN: Procalcitonin: 3.47 ng/mL

## 2017-04-11 LAB — MRSA PCR SCREENING: MRSA by PCR: POSITIVE — AB

## 2017-04-11 LAB — MAGNESIUM: Magnesium: 1.8 mg/dL (ref 1.7–2.4)

## 2017-04-11 MED ORDER — MORPHINE SULFATE (PF) 2 MG/ML IV SOLN
2.0000 mg | INTRAVENOUS | Status: DC | PRN
  Administered 2017-04-11 – 2017-04-12 (×9): 2 mg via INTRAVENOUS
  Filled 2017-04-11 (×9): qty 1

## 2017-04-11 MED ORDER — DEXMEDETOMIDINE HCL IN NACL 400 MCG/100ML IV SOLN
0.4000 ug/kg/h | INTRAVENOUS | Status: DC
  Administered 2017-04-11: 0.4 ug/kg/h via INTRAVENOUS
  Administered 2017-04-12: 0.6 ug/kg/h via INTRAVENOUS
  Filled 2017-04-11 (×2): qty 100

## 2017-04-11 MED ORDER — MUPIROCIN 2 % EX OINT
1.0000 "application " | TOPICAL_OINTMENT | Freq: Two times a day (BID) | CUTANEOUS | Status: AC
  Administered 2017-04-12 – 2017-04-15 (×7): 1 via NASAL
  Filled 2017-04-11: qty 22

## 2017-04-11 MED ORDER — HALOPERIDOL LACTATE 5 MG/ML IJ SOLN
2.5000 mg | INTRAMUSCULAR | Status: AC
  Administered 2017-04-11: 2.5 mg via INTRAVENOUS
  Filled 2017-04-11: qty 1

## 2017-04-11 MED ORDER — CHLORHEXIDINE GLUCONATE CLOTH 2 % EX PADS
6.0000 | MEDICATED_PAD | Freq: Every day | CUTANEOUS | Status: AC
  Administered 2017-04-11 – 2017-04-15 (×4): 6 via TOPICAL

## 2017-04-11 MED ORDER — METOPROLOL TARTRATE 5 MG/5ML IV SOLN
5.0000 mg | Freq: Four times a day (QID) | INTRAVENOUS | Status: DC | PRN
  Administered 2017-04-11: 5 mg via INTRAVENOUS
  Filled 2017-04-11: qty 5

## 2017-04-11 MED ORDER — METOPROLOL TARTRATE 5 MG/5ML IV SOLN: 5.0000 mg | INTRAVENOUS | Status: DC | PRN

## 2017-04-11 MED ORDER — POTASSIUM CHLORIDE 10 MEQ/100ML IV SOLN
10.0000 meq | INTRAVENOUS | Status: AC
  Administered 2017-04-11 (×2): 10 meq via INTRAVENOUS
  Filled 2017-04-11 (×2): qty 100

## 2017-04-11 MED ORDER — AMIODARONE IV BOLUS ONLY 150 MG/100ML
150.0000 mg | Freq: Once | INTRAVENOUS | Status: AC
  Administered 2017-04-11: 150 mg via INTRAVENOUS
  Filled 2017-04-11: qty 100

## 2017-04-11 MED ORDER — FUROSEMIDE 10 MG/ML IJ SOLN
40.0000 mg | INTRAMUSCULAR | Status: AC
  Administered 2017-04-11: 40 mg via INTRAVENOUS
  Filled 2017-04-11: qty 4

## 2017-04-11 MED ORDER — SODIUM PHOSPHATES 45 MMOLE/15ML IV SOLN
10.0000 mmol | Freq: Once | INTRAVENOUS | Status: AC
  Administered 2017-04-12: 10 mmol via INTRAVENOUS
  Filled 2017-04-11 (×2): qty 3.33

## 2017-04-11 NOTE — Progress Notes (Addendum)
[place] [facility] [place] NAME: Susan Pratt    MR#:  [id number]  DATE OF BIRTH:  [birth year]  SUBJECTIVE:  Patient came in with altered mental status.  She has been having a lot of back pain and left hip pain with recent steroid injection last week. Opens eyes to verbal command.  She is having high-grade fever of 101.9 Very restless Moaning with back pain No family in the room REVIEW OF SYSTEMS:   Review of Systems  Unable to perform ROS: Medical condition   DRUG ALLERGIES:   Allergies  Allergen Reactions  . Lisinopril Swelling    Causes tongue swelling    VITALS:  Blood pressure (!) 121/49, pulse (!) 112, temperature (!) 101.9 F (38.8 C), temperature source Axillary, resp. rate 20, height 5\' 10"  (1.778 m), weight 117.9 kg (260 lb), SpO2 (!) 88 %.  PHYSICAL EXAMINATION:   Physical Exam  GENERAL:  76 y.o.-year-old patient lying in the bed with no acute distress.  Appears ill EYES: Pupils equal, round, reactive to light and accommodation. No scleral icterus. Extraocular muscles intact.  HEENT: Head atraumatic, normocephalic. Oropharynx and nasopharynx dry NECK:  Supple, no jugular venous distention. No thyroid enlargement, no tenderness.  LUNGS: Normal breath sounds bilaterally, no wheezing, rales, rhonchi. No use of accessory muscles of respiration.  CARDIOVASCULAR: S1, S2 normal. No murmurs, rubs, or gallops.  Tachycardia aBDOMEN: Soft, nontender, nondistended. Bowel sounds present. No organomegaly or mass.  EXTREMITIES: No cyanosis, clubbing or edema b/l.    NEUROLOGIC: Able to assess at present however moves all extremities spontaneously PSYCHIATRIC:  patient is very restless response to verbal commands  sKIN: Bilateral heel superficial small skin tears prior to admission  LABORATORY PANEL:  CBC Recent Labs  Lab 04/11/17 0333  WBC 24.6*  HGB 11.8*  HCT 36.1  PLT 342    Chemistries  Recent Labs  Lab 04/10/17 0640  04/11/17 0333  NA 133* 133*  K 4.6 3.9  CL 99* 101  CO2 24 24  GLUCOSE 217* 160*  BUN 26* 19  CREATININE 0.85 0.66  CALCIUM 9.4 8.5*  AST 16  --   ALT 29  --   ALKPHOS 67  --   BILITOT 1.6*  --    Cardiac Enzymes Recent Labs  Lab 04/10/17 0640  TROPONINI <0.03   RADIOLOGY:  Ct Abdomen Pelvis W Contrast  Result Date: 04/10/2017 CLINICAL DATA:  Abdominal and back pain with nausea.  Dysuria. EXAM: CT ABDOMEN AND PELVIS WITH CONTRAST TECHNIQUE: Multidetector CT imaging of the abdomen and pelvis was performed using the standard protocol following bolus administration of intravenous contrast. CONTRAST:  118mL ISOVUE-300 IOPAMIDOL (ISOVUE-300) INJECTION 61% COMPARISON:  February 08, 2008 FINDINGS: Lower chest: There is atelectatic change in the lung bases, more on the left than on the right. Heart appears prominent. There are foci of coronary artery calcification. Hepatobiliary: Liver measures 19.8 cm in length. No focal liver lesions are appreciable. Gallbladder is absent. The common bile duct measures 11 mm, mildly enlarged. No biliary duct mass or calculus evident. Pancreas: No pancreatic mass or inflammatory focus evident. Spleen: No splenic lesions are evident. Adrenals/Urinary Tract: Adrenals appear unremarkable. 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 There is a cyst arising from the medial left kidney lower pole region measuring 1.9 x 1.3 cm. There are parapelvic cysts in the left kidney, largest measuring 1.5 x 1.5 cm. There is no appreciable hydronephrosis  on either side. There is no renal or ureteral calculus on either side. Urinary bladder is midline with wall thickness within normal limits. Stomach/Bowel: There is no appreciable bowel wall or mesenteric thickening. There is moderate stool throughout the colon. There is no bowel obstruction. No bowel pneumatosis. There is no free air or portal venous air. Vascular/Lymphatic: There is  atherosclerotic calcification in the aorta and iliac arteries. Aorta is tortuous. No aneurysm. Major mesenteric vessels appear patent. No adenopathy is evident in the abdomen or pelvis. Reproductive: Uterus is absent. There appears to be a degree of vaginal prolapse. No pelvic mass evident. Other: Appendix absent. No abscess or ascites is appreciable in the abdomen or pelvis. There is a small ventral hernia containing only fat. A stimulator is noted posteriorly on the right. Lead from the stimulator is seen anterior to the lower sacrum on the right in the right sacral plexus region. Musculoskeletal: There is extensive degenerative type change throughout the lower thoracic and lumbar spine regions. There are no blastic or lytic bone lesions. There is fatty infiltration throughout the gluteal muscles. There is atrophy of the proximal thigh muscles laterally on the left. IMPRESSION: 1. Urinary bladder wall thickness is normal. No renal or ureteral calculus. No hydronephrosis. 2.  No bowel obstruction.  No abscess.  Appendix absent. 3. Widespread thoracic and lumbar arthropathy. Extensive bony overgrowth at multiple sites. 4. Aortoiliac atherosclerosis. No aneurysm. No mesenteric ischemia appreciable by CT. 5. Prominent liver without focal lesion evident. Mild prominence of the common bile duct evident without mass or calculus evident within the biliary ductal system. Gallbladder absent. 6.  Evidence a degree of vaginal prolapse.  Uterus absent. 7. Stimulator in the posterior right pelvic region with stimulator lead extending into the region of the right sacral plexus. 8.  Small ventral hernia containing only fat. 9.  Muscle atrophy left proximal thigh region. Aortic Atherosclerosis (ICD10-I70.0). Electronically Signed   By: Lowella Grip III M.D.   On: 04/10/2017 10:05   Dg Chest Port 1 View  Result Date: 04/10/2017 CLINICAL DATA:  Back pain.  History of pulmonary embolus. EXAM: PORTABLE CHEST 1 VIEW  COMPARISON:  Chest CT 09/04/2012 FINDINGS: The cardiac silhouette is enlarged. Mediastinal contours appear intact. Tortuosity and atherosclerotic disease of the aorta. There is no evidence of focal airspace consolidation, pleural effusion or pneumothorax. Mild bilateral lower lobe atelectasis. Mild bilateral pleural thickening, stable. Osseous structures are without acute abnormality. Soft tissues are grossly normal. IMPRESSION: Mildly enlarged heart. Calcific atherosclerotic disease and tortuosity of the aorta. Persistent mild bilateral pleural thickening. Electronically Signed   By: Fidela Salisbury M.D.   On: 04/10/2017 07:37   ASSESSMENT AND PLAN:   76 year old female with past medical history of arthritis, diabetes, hypertension comes to the emergency room with altered mental status and fever.  She has been complaining of chronic back pain and left hip pain. She had a steroid injection a left hip last Thursday as an outpatient. She is unable to give me much history She is fairly confused. Her husband is with her says yesterday she was getting up off the commode and tripped over the dryer. She started having a lot more pain then. He said she started getting confused today and had fever yesterday    1. gram-positive cocci  sepsis.  Source is unknown at this time could be left hip versus lumbar vertebrae infection/abscess given severe back pain.  CT abdomen shows extensive arthropathy of lumbar and thoracic spine -Patient is tachycardic with high-grade  fever of 101.9 leukocytosis -Continue broad-spectrum antibiotics with IV vancomycin and Zosyn -BC ID indicates MRSA -Patient will need to get MRI of the lumbar spine to rule out abscess/osteomyelitis -Echo of the heart to look for vegetation--- consider TEE -ID consultation placed -We will transfer patient to ICU spoke with Dr. Mortimer Fries -IV fluids for hydration -Tylenol suppository -Patient has a right pelvic stimulator as seen on CT  2.  Metabolic encephalopathy. Likely secondary to the sepsis.   3. Left hip bursitis. As noted above she had a steroid injection on Thursday. There is some bruising around the site. CT scan of the abdomen got some cuts down that area and do not see any obvious abscess.  pt was seen by orthopedic   4. Chronic pain. We'll continue her current medications.    5. Diabetes. We'll hold her Glucophage since she just had a contrasted scan. -Sliding scale insulin for now  She appears critically ill.  We will move her to ICU for close monitoring Currently no family in the room.  We will try to obtain has been  Case discussed with Care Management/Social Worker.  CODE STATUS: Full  DVT Prophylaxis: Lovenox**  TOTAL CRITICAL TIME TAKING CARE OF THIS PATIENT: 30 minutes.  >50% time spent on counselling and coordination of care    Note: This dictation was prepared with Dragon dictation along with smaller phrase technology. Any transcriptional errors that result from this process are unintentional.  Sudiksha Victor M.D on 04/11/2017 at 8:24 AM  Between 7am to 6pm - Pager - [phone]  After 6pm go to [email or web address] - password [email or web address] [facility]  Office  [phone]  CC: Primary care physician; Adline Potter, MD

## 2017-04-11 NOTE — Progress Notes (Signed)
Orthopedics Progress Note  CC: continued fevers, back pain/hip pain  S: Obtained from son at bedside. Continues to spike fevers and still complains of severe back and hip pain intermittently. Pain is sharp and severe in nature.  RoS:  Musculoskeletal: joint pain, back pain   O:  Gen: not conversant, NAD BP 126/76   Pulse (!) 113   Temp (!) 100.7 F (38.2 C) (Oral)   Resp (!) 29   Ht 5\' 10"  (1.778 m)   Wt 117.9 kg (260 lb)   SpO2 92%   BMI 37.31 kg/m   LLE: - Unable to follow commands to test sens/motor function - Foot wwp - No significant focal tenderness to palpation over greater trochanter - RoM: passively, 90 flexion, 30 ER, 20 IR with no apparent grimace or change in vital signs  A/P: 76 yo F w/sepsis from unknown etiology. Hip exam continues to be benign and would recommend search for alternate pathology such as L-spine.  1. No significant clinical concern for hip infection at this time.  2. Could consider MRI L-spine to evaluate lumbar spine as a potential source. Recommend Spine consult if there are positive findings.  3. Will follow peripherally. Please page with questions  Cato Mulligan, MD Orthopedic Surgery

## 2017-04-11 NOTE — Progress Notes (Signed)
Pharmacy Antibiotic Note  Susan Pratt is a 76 y.o. female admitted on 04/10/2017 with sepsis.  Pharmacy has been consulted for Vancomycin and Zosyn dosing. Patient received Vancomycin 1g and Zosyn 3.375g in the ED.   Plan: Continue Vancomycin 1250 mg IV every 12 hours. Goal trough 15-20 mcg/mL. Trough level ordered prior to 5th dose on 11/13 at 02:30.   Continue Zosyn 3.375g IV q8h (4 hour infusion).  Height: 5\' 10"  (177.8 cm) Weight: 260 lb (117.9 kg) IBW/kg (Calculated) : 68.5  Temp (24hrs), Avg:100.8 F (38.2 C), Min:99.2 F (37.3 C), Max:102.2 F (39 C)  Recent Labs  Lab 04/10/17 0640 04/11/17 0333  WBC 21.0* 24.6*  CREATININE 0.85 0.66  LATICACIDVEN 1.5  --     Estimated Creatinine Clearance: 82.1 mL/min (by C-G formula based on SCr of 0.66 mg/dL).    Allergies  Allergen Reactions  . Lisinopril Swelling    Causes tongue swelling    Antimicrobials this admission: Zosyn 11/11 >>  Vancomycin 11/11 >>   Dose adjustments this admission: N/A   Microbiology results: 11/11 BCx: MRSA 11/11 UCx: E coli  11/12 MRSA PCR: Positive  Thank you for allowing pharmacy to be a part of this patient's care.  Lendon Ka, PharmD Pharmacy Resident 04/11/2017 2:40 PM

## 2017-04-11 NOTE — Consult Note (Addendum)
[facility] Infectious Disease     Reason for Consult:  MRSA bacteremia  Referring Physician: Nicholes Mango Date of Admission:  04/10/2017   Active Problems:   Sepsis ([place])   Pressure injury of skin   HPI: Susan Pratt is a 76 y.o. female Admitted with left hip pain after a steroid injection in her hip on 03/31/17.  She also had confusion and a fall.  On admission her white blood count was 21,000 and her temperature 100.2.  She is spiked to 102.2.   She was initially started on vancomycin and Zosyn.  Blood cultures are growing MRSA.  Urine culture is growing E. coli.  Imaging shows on a CT of her abdomen and pelvis no acute findings.  Does have a posterior right pelvic stimulator. She is been transferred to the intensive care unit.  She does have some venous stasis ulceration and a clean buttock stage 2 ulcer.   Currently in unit, but not intubated. Husband at bedside.  He has been caring for her t home for years as she cannot walk.    Past Medical History:  Diagnosis Date  . Arthritis    ra  . Bronchospastic airway disease   . Cancer (HCC)    cervical  . Cataract   . Diabetes mellitus without complication ([place])   . 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 Hyperlipidemia   . Hypertension   . 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 Neuropathy    peripheral  . PE (pulmonary embolism)    2015   Past Surgical History:  Procedure Laterality Date  . ABDOMINAL HYSTERECTOMY    . APPENDECTOMY    . BACK SURGERY     2010  . BLADDER SURGERY     STIMULATOR  . CHOLECYSTECTOMY    . EYE SURGERY    . JOINT REPLACEMENT    . KNEE SURGERY     Social History   Tobacco Use  . Smoking status: Never Smoker  . Smokeless tobacco: Never Used  Substance Use Topics  . Alcohol use: No  . Drug use: No   Family History  Problem Relation Age of Onset  . Cancer Mother   . Diabetes Mother   . Heart attack Father     Allergies:  Allergies  Allergen  Reactions  . Lisinopril Swelling    Causes tongue swelling    Current antibiotics: Antibiotics Given (last 72 hours)    Date/Time Action Medication Dose Rate   04/10/17 0752 New Bag/Given   piperacillin-tazobactam (ZOSYN) IVPB 3.375 g 3.375 g 100 mL/hr   04/10/17 0752 New Bag/Given   vancomycin (VANCOCIN) IVPB 1000 mg/200 mL premix 1,000 mg 200 mL/hr   04/10/17 1451 New Bag/Given   vancomycin (VANCOCIN) 1,250 mg in sodium chloride 0.9 % 250 mL IVPB 1,250 mg 166.7 mL/hr   04/10/17 1627 New Bag/Given   piperacillin-tazobactam (ZOSYN) IVPB 3.375 g 3.375 g 12.5 mL/hr   04/10/17 2308 New Bag/Given   piperacillin-tazobactam (ZOSYN) IVPB 3.375 g 3.375 g 12.5 mL/hr   04/11/17 0331 New Bag/Given   vancomycin (VANCOCIN) 1,250 mg in sodium chloride 0.9 % 250 mL IVPB 1,250 mg 166.7 mL/hr   04/11/17 0649 New Bag/Given   piperacillin-tazobactam (ZOSYN) IVPB 3.375 g 3.375 g 12.5 mL/hr      MEDICATIONS: . acetaminophen  650 mg Oral Once  . aspirin EC  81 mg Oral Daily  . Chlorhexidine Gluconate Cloth  6  each Topical [id number]  . cholecalciferol  1,000 Units Oral Daily  . enoxaparin (LOVENOX) injection  40 mg Subcutaneous Q24H  . gabapentin  600 mg Oral QID  . insulin aspart  0-20 Units Subcutaneous TID WC  . insulin aspart  0-5 Units Subcutaneous QHS  . multivitamin with minerals  1 tablet Oral Daily  . mupirocin ointment  1 application Nasal BID  . oxybutynin  5 mg Oral BID  . vitamin B-12  1,000 mcg Oral Daily    Review of Systems - unable to obtaiin  OBJECTIVE: Temp:  [99.2 F (37.3 C)-102.2 F (39 C)] 100.4 F (38 C) (11/12 1146) Pulse Rate:  [100-116] 102 (11/12 1200) Resp:  [18-38] 28 (11/12 1200) BP: (112-144)/(49-67) 112/62 (11/12 1200) SpO2:  [88 %-94 %] 91 % (11/12 1200) Physical Exam  Constitutional:  lethargic HENT: [place]/AT, PERRLA, no scleral icterus Mouth/Throat: Oropharynx is clear and dry. No oropharyngeal exudate.  Cardiovascular: Normal rate, regular rhythm and  normal heart sounds.  Pulmonary/Chest: Effort normal and breath sounds normal. No respiratory distress.  has no wheezes.  Neck = supple, no nuchal rigidity Abdominal: Soft. Bowel sounds are normal.  exhibits no distension. There is no tenderness.  Lymphadenopathy: no cervical adenopathy. No axillary adenopathy Neurological: lethargi Skin: r heel wit shallow ulcer Psychiatric: unable  Ext grimaces with passive rom of l hip   LABS: Results for orders placed or performed during the hospital encounter of 04/10/17 (from the past 48 hour(s))  Urinalysis, Complete w Microscopic     Status: Abnormal   Collection Time: 04/10/17  6:40 AM  Result Value Ref Range   Color, Urine AMBER (A) YELLOW    Comment: BIOCHEMICALS MAY BE AFFECTED BY COLOR   APPearance HAZY (A) CLEAR   Specific Gravity, Urine 1.027 1.005 - 1.030   pH 5.0 5.0 - 8.0   Glucose, UA NEGATIVE NEGATIVE mg/dL   Hgb urine dipstick NEGATIVE NEGATIVE   Bilirubin Urine NEGATIVE NEGATIVE   Ketones, ur NEGATIVE NEGATIVE mg/dL   Protein, ur 100 (A) NEGATIVE mg/dL   Nitrite NEGATIVE NEGATIVE   Leukocytes, UA NEGATIVE NEGATIVE   RBC / HPF 0-5 0 - 5 RBC/hpf   WBC, UA 0-5 0 - 5 WBC/hpf   Bacteria, UA RARE (A) NONE SEEN   Squamous Epithelial / LPF 0-5 (A) NONE SEEN   Mucus PRESENT    Hyaline Casts, UA PRESENT   Comprehensive metabolic panel     Status: Abnormal   Collection Time: 04/10/17  6:40 AM  Result Value Ref Range   Sodium 133 (L) 135 - 145 mmol/L   Potassium 4.6 3.5 - 5.1 mmol/L   Chloride 99 (L) 101 - 111 mmol/L   CO2 24 22 - 32 mmol/L   Glucose, Bld 217 (H) 65 - 99 mg/dL   BUN 26 (H) 6 - 20 mg/dL   Creatinine, Ser 0.85 0.44 - 1.00 mg/dL   Calcium 9.4 8.9 - 10.3 mg/dL   Total Protein 7.6 6.5 - 8.1 g/dL   Albumin 4.2 3.5 - 5.0 g/dL   AST 16 15 - 41 U/L   ALT 29 14 - 54 U/L   Alkaline Phosphatase 67 38 - 126 U/L   Total Bilirubin 1.6 (H) 0.3 - 1.2 mg/dL   GFR calc non Af Amer >60 >60 mL/min   GFR calc Af Amer >60 >60  mL/min    Comment: (NOTE) The eGFR has been calculated using the CKD EPI equation. This calculation has not been validated in all clinical  situations. eGFR's persistently <60 mL/min signify possible Chronic Kidney Disease.    Anion gap 10 5 - 15  Lactic acid, plasma     Status: None   Collection Time: 04/10/17  6:40 AM  Result Value Ref Range   Lactic Acid, Venous 1.5 0.5 - 1.9 mmol/L  CBC with Differential     Status: Abnormal   Collection Time: 04/10/17  6:40 AM  Result Value Ref Range   WBC 21.0 (H) 3.6 - 11.0 K/uL   RBC 4.69 3.80 - 5.20 MIL/uL   Hemoglobin 14.2 12.0 - 16.0 g/dL   HCT 44.0 35.0 - 47.0 %   MCV 93.8 80.0 - 100.0 fL   MCH 30.4 26.0 - 34.0 pg   MCHC 32.4 32.0 - 36.0 g/dL   RDW 15.0 (H) 11.5 - 14.5 %   Platelets 448 (H) 150 - 440 K/uL   Neutrophils Relative % 89 %   Neutro Abs 18.5 (H) 1.4 - 6.5 K/uL   Lymphocytes Relative 5 %   Lymphs Abs 1.1 1.0 - 3.6 K/uL   Monocytes Relative 6 %   Monocytes Absolute 1.3 (H) 0.2 - 0.9 K/uL   Eosinophils Relative 0 %   Eosinophils Absolute 0.0 0 - 0.7 K/uL   Basophils Relative 0 %   Basophils Absolute 0.1 0 - 0.1 K/uL  Protime-INR     Status: None   Collection Time: 04/10/17  6:40 AM  Result Value Ref Range   Prothrombin Time 12.2 11.4 - 15.2 seconds   INR 0.91   Urine culture     Status: Abnormal (Preliminary result)   Collection Time: 04/10/17  6:40 AM  Result Value Ref Range   Specimen Description URINE, RANDOM    Special Requests NONE    Culture (A)     >=100,000 COLONIES/mL ESCHERICHIA COLI SUSCEPTIBILITIES TO FOLLOW Performed at [facility], [address], [place], [place] [address]    Report Status PENDING   Troponin I     Status: None   Collection Time: 04/10/17  6:40 AM  Result Value Ref Range   Troponin I <0.03 <0.03 ng/mL  Brain natriuretic peptide     Status: None   Collection Time: 04/10/17  6:40 AM  Result Value Ref Range   B Natriuretic Peptide 27.0 0.0 - 100.0 pg/mL  Culture, blood  (Routine x 2)     Status: None (Preliminary result)   Collection Time: 04/10/17  6:48 AM  Result Value Ref Range   Specimen Description BLOOD RIGHT ANTECUBITAL    Special Requests      BOTTLES DRAWN AEROBIC AND ANAEROBIC Blood Culture adequate volume   Culture  Setup Time      GRAM POSITIVE COCCI IN BOTH AEROBIC AND ANAEROBIC BOTTLES CRITICAL RESULT CALLED TO, READ BACK BY AND VERIFIED WITH: JASON ROBBINS AT 2122 04/10/17.PMH    Culture GRAM POSITIVE COCCI    Report Status PENDING   Blood Culture ID Panel (Reflexed)     Status: Abnormal   Collection Time: 04/10/17  6:48 AM  Result Value Ref Range   Enterococcus species NOT DETECTED NOT DETECTED   Listeria monocytogenes NOT DETECTED NOT DETECTED   Staphylococcus species DETECTED (A) NOT DETECTED    Comment: CRITICAL RESULT CALLED TO, READ BACK BY AND VERIFIED WITH: JASON ROBBINS AT 2122 04/10/17.PMH    Staphylococcus aureus DETECTED (A) NOT DETECTED    Comment: Methicillin (oxacillin)-resistant Staphylococcus aureus (MRSA). MRSA is predictably resistant to beta-lactam antibiotics (except ceftaroline). Preferred therapy is vancomycin unless  clinically contraindicated. Patient requires contact precautions if  hospitalized. CRITICAL RESULT CALLED TO, READ BACK BY AND VERIFIED WITH: JASON ROBBINS AT 2122 04/10/17.PMH    Methicillin resistance DETECTED (A) NOT DETECTED    Comment: CRITICAL RESULT CALLED TO, READ BACK BY AND VERIFIED WITH: JASON ROBBINS AT 2122 04/10/17.PMH    Streptococcus species NOT DETECTED NOT DETECTED   Streptococcus agalactiae NOT DETECTED NOT DETECTED   Streptococcus pneumoniae NOT DETECTED NOT DETECTED   Streptococcus pyogenes NOT DETECTED NOT DETECTED   Acinetobacter baumannii NOT DETECTED NOT DETECTED   Enterobacteriaceae species NOT DETECTED NOT DETECTED   Enterobacter cloacae complex NOT DETECTED NOT DETECTED   Escherichia coli NOT DETECTED NOT DETECTED   Klebsiella oxytoca NOT DETECTED NOT DETECTED    Klebsiella pneumoniae NOT DETECTED NOT DETECTED   Proteus species NOT DETECTED NOT DETECTED   Serratia marcescens NOT DETECTED NOT DETECTED   Haemophilus influenzae NOT DETECTED NOT DETECTED   Neisseria meningitidis NOT DETECTED NOT DETECTED   Pseudomonas aeruginosa NOT DETECTED NOT DETECTED   Candida albicans NOT DETECTED NOT DETECTED   Candida glabrata NOT DETECTED NOT DETECTED   Candida krusei NOT DETECTED NOT DETECTED   Candida parapsilosis NOT DETECTED NOT DETECTED   Candida tropicalis NOT DETECTED NOT DETECTED  Culture, blood (Routine x 2)     Status: None (Preliminary result)   Collection Time: 04/10/17  7:38 AM  Result Value Ref Range   Specimen Description BLOOD RIGHT ASSIST CONTROL    Special Requests      BOTTLES DRAWN AEROBIC AND ANAEROBIC Blood Culture adequate volume   Culture  Setup Time      GRAM POSITIVE COCCI IN BOTH AEROBIC AND ANAEROBIC BOTTLES CRITICAL RESULT CALLED TO, READ BACK BY AND VERIFIED WITH: JASON ROBBINS AT 2122 04/10/17.PMH    Culture GRAM POSITIVE COCCI    Report Status PENDING   C-reactive protein     Status: Abnormal   Collection Time: 04/10/17  1:03 PM  Result Value Ref Range   CRP 11.1 (H) <1.0 mg/dL    Comment: Performed at [facility] [address] [place], [place] [address]  Sedimentation rate     Status: None   Collection Time: 04/10/17  1:03 PM  Result Value Ref Range   Sed Rate 8 0 - 30 mm/hr  Glucose, capillary     Status: Abnormal   Collection Time: 04/10/17  2:07 PM  Result Value Ref Range   Glucose-Capillary 172 (H) 65 - 99 mg/dL  Glucose, capillary     Status: Abnormal   Collection Time: 04/10/17  4:28 PM  Result Value Ref Range   Glucose-Capillary 179 (H) 65 - 99 mg/dL   Comment 1 Notify RN   Glucose, capillary     Status: Abnormal   Collection Time: 04/10/17  9:22 PM  Result Value Ref Range   Glucose-Capillary 174 (H) 65 - 99 mg/dL   Comment 1 Notify RN   Basic metabolic panel     Status: Abnormal    Collection Time: 04/11/17  3:33 AM  Result Value Ref Range   Sodium 133 (L) 135 - 145 mmol/L   Potassium 3.9 3.5 - 5.1 mmol/L   Chloride 101 101 - 111 mmol/L   CO2 24 22 - 32 mmol/L   Glucose, Bld 160 (H) 65 - 99 mg/dL   BUN 19 6 - 20 mg/dL   Creatinine, Ser 0.66 0.44 - 1.00 mg/dL   Calcium 8.5 (L) 8.9 - 10.3 mg/dL   GFR calc non  Af Amer >60 >60 mL/min   GFR calc Af Amer >60 >60 mL/min    Comment: (NOTE) The eGFR has been calculated using the CKD EPI equation. This calculation has not been validated in all clinical situations. eGFR's persistently <60 mL/min signify possible Chronic Kidney Disease.    Anion gap 8 5 - 15  CBC     Status: Abnormal   Collection Time: 04/11/17  3:33 AM  Result Value Ref Range   WBC 24.6 (H) 3.6 - 11.0 K/uL   RBC 3.85 3.80 - 5.20 MIL/uL   Hemoglobin 11.8 (L) 12.0 - 16.0 g/dL   HCT 36.1 35.0 - 47.0 %   MCV 93.7 80.0 - 100.0 fL   MCH 30.6 26.0 - 34.0 pg   MCHC 32.7 32.0 - 36.0 g/dL   RDW 14.7 (H) 11.5 - 14.5 %   Platelets 342 150 - 440 K/uL  Glucose, capillary     Status: Abnormal   Collection Time: 04/11/17  7:35 AM  Result Value Ref Range   Glucose-Capillary 143 (H) 65 - 99 mg/dL  Glucose, capillary     Status: Abnormal   Collection Time: 04/11/17  9:39 AM  Result Value Ref Range   Glucose-Capillary 117 (H) 65 - 99 mg/dL  MRSA PCR Screening     Status: Abnormal   Collection Time: 04/11/17  9:40 AM  Result Value Ref Range   MRSA by PCR POSITIVE (A) NEGATIVE    Comment:        The GeneXpert MRSA Assay (FDA approved for NASAL specimens only), is one component of a comprehensive MRSA colonization surveillance program. It is not intended to diagnose MRSA infection nor to guide or monitor treatment for MRSA infections. RESULT CALLED TO, READ BACK BY AND VERIFIED WITH: BRANDY MANSFIELD @ 1108 04/11/17 [place]   Blood gas, arterial     Status: Abnormal   Collection Time: 04/11/17 10:05 AM  Result Value Ref Range   FIO2 0.28    Delivery  systems NASAL CANNULA    pH, Arterial 7.44 7.350 - 7.450   pCO2 arterial 33 32.0 - 48.0 mmHg   pO2, Arterial 66 (L) 83.0 - 108.0 mmHg   Bicarbonate 24.0 20.0 - 28.0 mmol/L   Acid-Base Excess 0.8 0.0 - 2.0 mmol/L   O2 Saturation 91.1 %   Patient temperature 39.0    Collection site LEFT RADIAL    Sample type ARTERIAL DRAW    Allens test (pass/fail) PASS PASS  Glucose, capillary     Status: Abnormal   Collection Time: 04/11/17 11:39 AM  Result Value Ref Range   Glucose-Capillary 155 (H) 65 - 99 mg/dL   No components found for: ESR, C REACTIVE PROTEIN MICRO: Recent Results (from the past 720 hour(s))  Urine culture     Status: Abnormal (Preliminary result)   Collection Time: 04/10/17  6:40 AM  Result Value Ref Range Status   Specimen Description URINE, RANDOM  Final   Special Requests NONE  Final   Culture (A)  Final    >=100,000 COLONIES/mL ESCHERICHIA COLI SUSCEPTIBILITIES TO FOLLOW Performed at [facility], [address] [place], [place] [address]    Report Status PENDING  Incomplete  Culture, blood (Routine x 2)     Status: None (Preliminary result)   Collection Time: 04/10/17  6:48 AM  Result Value Ref Range Status   Specimen Description BLOOD RIGHT ANTECUBITAL  Final   Special Requests   Final    BOTTLES DRAWN AEROBIC AND ANAEROBIC Blood  Culture adequate volume   Culture  Setup Time   Final    GRAM POSITIVE COCCI IN BOTH AEROBIC AND ANAEROBIC BOTTLES CRITICAL RESULT CALLED TO, READ BACK BY AND VERIFIED WITH: JASON ROBBINS AT 2122 04/10/17.PMH    Culture GRAM POSITIVE COCCI  Final   Report Status PENDING  Incomplete  Blood Culture ID Panel (Reflexed)     Status: Abnormal   Collection Time: 04/10/17  6:48 AM  Result Value Ref Range Status   Enterococcus species NOT DETECTED NOT DETECTED Final   Listeria monocytogenes NOT DETECTED NOT DETECTED Final   Staphylococcus species DETECTED (A) NOT DETECTED Final    Comment: CRITICAL RESULT CALLED TO, READ BACK BY AND  VERIFIED WITH: JASON ROBBINS AT 2122 04/10/17.PMH    Staphylococcus aureus DETECTED (A) NOT DETECTED Final    Comment: Methicillin (oxacillin)-resistant Staphylococcus aureus (MRSA). MRSA is predictably resistant to beta-lactam antibiotics (except ceftaroline). Preferred therapy is vancomycin unless clinically contraindicated. Patient requires contact precautions if  hospitalized. CRITICAL RESULT CALLED TO, READ BACK BY AND VERIFIED WITH: JASON ROBBINS AT 2122 04/10/17.PMH    Methicillin resistance DETECTED (A) NOT DETECTED Final    Comment: CRITICAL RESULT CALLED TO, READ BACK BY AND VERIFIED WITH: JASON ROBBINS AT 2122 04/10/17.PMH    Streptococcus species NOT DETECTED NOT DETECTED Final   Streptococcus agalactiae NOT DETECTED NOT DETECTED Final   Streptococcus pneumoniae NOT DETECTED NOT DETECTED Final   Streptococcus pyogenes NOT DETECTED NOT DETECTED Final   Acinetobacter baumannii NOT DETECTED NOT DETECTED Final   Enterobacteriaceae species NOT DETECTED NOT DETECTED Final   Enterobacter cloacae complex NOT DETECTED NOT DETECTED Final   Escherichia coli NOT DETECTED NOT DETECTED Final   Klebsiella oxytoca NOT DETECTED NOT DETECTED Final   Klebsiella pneumoniae NOT DETECTED NOT DETECTED Final   Proteus species NOT DETECTED NOT DETECTED Final   Serratia marcescens NOT DETECTED NOT DETECTED Final   Haemophilus influenzae NOT DETECTED NOT DETECTED Final   Neisseria meningitidis NOT DETECTED NOT DETECTED Final   Pseudomonas aeruginosa NOT DETECTED NOT DETECTED Final   Candida albicans NOT DETECTED NOT DETECTED Final   Candida glabrata NOT DETECTED NOT DETECTED Final   Candida krusei NOT DETECTED NOT DETECTED Final   Candida parapsilosis NOT DETECTED NOT DETECTED Final   Candida tropicalis NOT DETECTED NOT DETECTED Final  Culture, blood (Routine x 2)     Status: None (Preliminary result)   Collection Time: 04/10/17  7:38 AM  Result Value Ref Range Status   Specimen Description  BLOOD RIGHT ASSIST CONTROL  Final   Special Requests   Final    BOTTLES DRAWN AEROBIC AND ANAEROBIC Blood Culture adequate volume   Culture  Setup Time   Final    GRAM POSITIVE COCCI IN BOTH AEROBIC AND ANAEROBIC BOTTLES CRITICAL RESULT CALLED TO, READ BACK BY AND VERIFIED WITH: JASON ROBBINS AT 2122 04/10/17.PMH    Culture GRAM POSITIVE COCCI  Final   Report Status PENDING  Incomplete  MRSA PCR Screening     Status: Abnormal   Collection Time: 04/11/17  9:40 AM  Result Value Ref Range Status   MRSA by PCR POSITIVE (A) NEGATIVE Final    Comment:        The GeneXpert MRSA Assay (FDA approved for NASAL specimens only), is one component of a comprehensive MRSA colonization surveillance program. It is not intended to diagnose MRSA infection nor to guide or monitor treatment for MRSA infections. RESULT CALLED TO, READ BACK BY AND VERIFIED WITH: BRANDY MANSFIELD @ 1108  04/11/17 [place]     IMAGING: Ct Abdomen Pelvis W Contrast  Result Date: 04/10/2017 CLINICAL DATA:  Abdominal and back pain with nausea.  Dysuria. EXAM: CT ABDOMEN AND PELVIS WITH CONTRAST TECHNIQUE: Multidetector CT imaging of the abdomen and pelvis was performed using the standard protocol following bolus administration of intravenous contrast. CONTRAST:  125m ISOVUE-300 IOPAMIDOL (ISOVUE-300) INJECTION 61% COMPARISON:  February 08, 2008 FINDINGS: Lower chest: There is atelectatic change in the lung bases, more on the left than on the right. Heart appears prominent. There are foci of coronary artery calcification. Hepatobiliary: Liver measures 19.8 cm in length. No focal liver lesions are appreciable. Gallbladder is absent. The common bile duct measures 11 mm, mildly enlarged. No biliary duct mass or calculus evident. Pancreas: No pancreatic mass or inflammatory focus evident. Spleen: No splenic lesions are evident. Adrenals/Urinary Tract: Adrenals appear unremarkable. 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 There is a cyst arising from the medial left kidney lower pole region measuring 1.9 x 1.3 cm. There are parapelvic cysts in the left kidney, largest measuring 1.5 x 1.5 cm. There is no appreciable hydronephrosis on either side. There is no renal or ureteral calculus on either side. Urinary bladder is midline with wall thickness within normal limits. Stomach/Bowel: There is no appreciable bowel wall or mesenteric thickening. There is moderate stool throughout the colon. There is no bowel obstruction. No bowel pneumatosis. There is no free air or portal venous air. Vascular/Lymphatic: There is atherosclerotic calcification in the aorta and iliac arteries. Aorta is tortuous. No aneurysm. Major mesenteric vessels appear patent. No adenopathy is evident in the abdomen or pelvis. Reproductive: Uterus is absent. There appears to be a degree of vaginal prolapse. No pelvic mass evident. Other: Appendix absent. No abscess or ascites is appreciable in the abdomen or pelvis. There is a small ventral hernia containing only fat. A stimulator is noted posteriorly on the right. Lead from the stimulator is seen anterior to the lower sacrum on the right in the right sacral plexus region. Musculoskeletal: There is extensive degenerative type change throughout the lower thoracic and lumbar spine regions. There are no blastic or lytic bone lesions. There is fatty infiltration throughout the gluteal muscles. There is atrophy of the proximal thigh muscles laterally on the left. IMPRESSION: 1. Urinary bladder wall thickness is normal. No renal or ureteral calculus. No hydronephrosis. 2.  No bowel obstruction.  No abscess.  Appendix absent. 3. Widespread thoracic and lumbar arthropathy. Extensive bony overgrowth at multiple sites. 4. Aortoiliac atherosclerosis. No aneurysm. No mesenteric ischemia appreciable by CT. 5. Prominent liver without focal lesion evident. Mild prominence of the common bile duct evident  without mass or calculus evident within the biliary ductal system. Gallbladder absent. 6.  Evidence a degree of vaginal prolapse.  Uterus absent. 7. Stimulator in the posterior right pelvic region with stimulator lead extending into the region of the right sacral plexus. 8.  Small ventral hernia containing only fat. 9.  Muscle atrophy left proximal thigh region. Aortic Atherosclerosis (ICD10-I70.0). Electronically Signed   By: WLowella GripIII M.D.   On: 04/10/2017 10:05   Dg Chest Port 1 View  Result Date: 04/10/2017 CLINICAL DATA:  Back pain.  History of pulmonary embolus. EXAM: PORTABLE CHEST 1 VIEW COMPARISON:  Chest CT 09/04/2012 FINDINGS: The cardiac silhouette is enlarged. Mediastinal contours appear intact. Tortuosity and atherosclerotic disease of the aorta. There is no evidence of focal airspace consolidation, pleural effusion or  pneumothorax. Mild bilateral lower lobe atelectasis. Mild bilateral pleural thickening, stable. Osseous structures are without acute abnormality. Soft tissues are grossly normal. IMPRESSION: Mildly enlarged heart. Calcific atherosclerotic disease and tortuosity of the aorta. Persistent mild bilateral pleural thickening. Electronically Signed   By: Fidela Salisbury M.D.   On: 04/10/2017 07:37    Assessment:   NALDA SHACKLEFORD is a 76 y.o. female with MRSA bacteremia and hip/back pain. Had L hip injection 11/1 for chronic pain at the site. She has RA and is on plaquenil and MTX as otpt. She does have some skin wounds as likely source. Some concern she has seeded his L hip. ALso has R TKR and prosthetic L shoulder but no evidence infection at those sites.  Will need wu for endocarditis as well as evaluation for vert osteo or L hip infection.  She also has a pelvix stimulator in place.  Has E coli in urine but UA with only 0-5 wbc so likely contaminant.  CRP 11, ESR 8   Recommendations Continue vanco but can dc zosyn Check Echo - if neg can consider TEE but would  wait until clinically stable Repeat BCX to document clearance.  Ortho consult. To eval hip and back  Consider MRI L spine if can be done with stimulator in place Once bcx neg for 48 hours will need PICC placed and min 2 weeks IV abx.   Thank you very much for allowing me to participate in the care of this patient. Please call with questions.   Cheral Marker. Ola Spurr, MD

## 2017-04-11 NOTE — Progress Notes (Signed)
*  PRELIMINARY RESULTS* Echocardiogram 2D Echocardiogram has been performed.  Sherrie Sport 04/11/2017, 4:40 PM

## 2017-04-11 NOTE — Consult Note (Signed)
[place] Nurse wound consult note Reason for Consult:Asked to consult on nonintact skin.  Bilateral heels with nonblanchable erythema and peeling epithelium.  Patient is listless and unable to follow commands.  Discussed need to offload pressure to bony prominences.  Turning, etc.  And recommended bilateral Prevalon boots to the heels to prevent further injury. Spouse refuses this. States the cost is too much (40 dollars each) and that she probably would not tolerate them while she sleeps.  Verbalized understanding and stated that we would try and offload with pillow under the calves although this is complicated by her listlessness.   Wound type:pressure.  HAs venous insufficiency and nonintact lesion to left medial lower leg.  Will keep silicone nonadherent foam to this area. Spouse indicates that she has compression garments that she wears at home.  States her legs look "very good" in relation to edema at this time.  Pressure Injury POA: Yes sacrum, moisture and pressure Measurement: Sacrum:  Stage 2:  2 cm x 2 cm x 0.1 cm  LEft medial leg:  1 cm x 1.5 cm x 0.1 cm  Bilateral heels:  3 cm x 3 cm intact nonblanchable edema, peeling epithelium Wound AJG:OTLX and moist Drainage (amount, consistency, odor) minimal serosanguinous to sacrum and leg Heels are intact Periwound:intact Dressing procedure/placement/frequency:Cleanse sacrum with soap and water.  Apply silicone border foam dressing.  Change every three days and PRN soilage.  Turn every two hours Float heels with pillows under her calf.  Silicone foam to left medial leg venous wound. Reconsult if edema increases. Will not follow at this time.  Please re-consult if needed.  Domenic Moras RN BSN [place] Pager [phone]

## 2017-04-11 NOTE — Progress Notes (Signed)
   CHIEF COMPLAINT:   Chief Complaint  Patient presents with  . Back Pain    Subjective  76 year old female. She has a history of chronic back pain. She's been having left hip pain. She had a steroid injection a left hip last Thursday as an outpatient.  She is unable to give me much history She is fairly confused. Her husband is with her says yesterday she was getting up off the commode and tripped over the dryer.  She started having a lot more pain then. He said she started getting confused today and had fever yesterday. Fevers as high as 102 She was tender in her abdomen when the ER examine her neck got up abdominal CT which shows no abnormalities. She is febrile and tachycardic.  Blood cultures +MRSA On ABX Transferred to ICU for delerium Patient is in pain     Objective   Examination:  General exam: lethargic, arousable  and in pain Respiratory system: Clear to auscultation. Respiratory effort increased due to pain HEENT: [place]/AT, PERRLA, no thrush, no stridor. Cardiovascular system: S1 & S2 heard, RRR. No JVD, murmurs, rubs, gallops or clicks. No pedal edema. Gastrointestinal system: Abdomen is nondistended, soft and nontender. No organomegaly or masses felt. Normal bowel sounds heard. Central nervous system: Alert and oriented. No focal neurological deficits. Extremities: Symmetric 5 x 5 power. Skin: No rashes, lesions or ulcers Psychiatry: Judgement and insight appear normal. Mood & affect appropriate.   VITALS:  height is 5\' 10"  (1.778 m) and weight is 260 lb (117.9 kg). Her oral temperature is 102.2 F (39 C) (abnormal). Her blood pressure is 124/65 and her pulse is 105 (abnormal). Her respiration is 38 (abnormal) and oxygen saturation is 93%.   I personally reviewed Labs under Results section.  Assessment/Plan:  76 yo white female with encephalopathy from pain from back injury In setting of Sepsis withy MRSA  bacteremia unclear source  1.continue IV abx 2.ID  consultation needed 3.moprhine as needed 4.SD monitoring  Code Status: Full COde    Corrin Parker, M.D.  [facility]  Medical Director [place] Director [facility] Cardio-Pulmonary Department

## 2017-04-11 NOTE — Care Management (Signed)
Patient transfer to ICU. RNCM will follow.

## 2017-04-11 NOTE — Progress Notes (Signed)
MD in pt's room as RN entered this am. Pt appears restless and is moaning. Axillary temp of 101.9, HR 112, 88% on RA-2L applied. She will follow some commands but not answer questions. Rectal tylenol administered to pt. Per MD tx to ICU. Report was called to [facility] in ICU. Pt and her belongings transferred to 04. MD left message for pt's husband.   [place], Jerry Caras

## 2017-04-11 NOTE — Progress Notes (Signed)
Patient transferred from floor this morning, RR in 30's and mildly tachycardic. Dr. Mortimer Fries assessed patient and gave verbal order for ABG. ABG normal, per Dr. Mortimer Fries symptoms may be from severe pain. Morphine q1h ordered. Patient improved until around 1700. RR increased to 30's again, morphine given 3 hours in a row without improvement. Around 1830 HR intermittently increasing to 140's, irregular. Hinton Dyer, NP present on floor and notified of changes without improvement. See chart for new orders placed. Report given to [facility], [place]. Wilnette Kales

## 2017-04-12 ENCOUNTER — Inpatient Hospital Stay: Payer: Medicare Other

## 2017-04-12 ENCOUNTER — Encounter: Payer: Self-pay | Admitting: Radiology

## 2017-04-12 DIAGNOSIS — R6520 Severe sepsis without septic shock: Secondary | ICD-10-CM

## 2017-04-12 DIAGNOSIS — R7881 Bacteremia: Secondary | ICD-10-CM

## 2017-04-12 DIAGNOSIS — A419 Sepsis, unspecified organism: Secondary | ICD-10-CM

## 2017-04-12 DIAGNOSIS — J811 Chronic pulmonary edema: Secondary | ICD-10-CM

## 2017-04-12 LAB — CBC WITH DIFFERENTIAL/PLATELET
BASOS PCT: 0 %
Basophils Absolute: 0 10*3/uL (ref 0–0.1)
EOS ABS: 0 10*3/uL (ref 0–0.7)
EOS PCT: 0 %
HCT: 40.5 % (ref 35.0–47.0)
HEMOGLOBIN: 13.2 g/dL (ref 12.0–16.0)
LYMPHS PCT: 7 %
Lymphs Abs: 1.9 10*3/uL (ref 1.0–3.6)
MCH: 31 pg (ref 26.0–34.0)
MCHC: 32.7 g/dL (ref 32.0–36.0)
MCV: 94.8 fL (ref 80.0–100.0)
MONO ABS: 2.2 10*3/uL — AB (ref 0.2–0.9)
Monocytes Relative: 8 %
NEUTROS PCT: 85 %
Neutro Abs: 23.3 10*3/uL — ABNORMAL HIGH (ref 1.4–6.5)
PLATELETS: 275 10*3/uL (ref 150–440)
RBC: 4.27 MIL/uL (ref 3.80–5.20)
RDW: 14.9 % — ABNORMAL HIGH (ref 11.5–14.5)
WBC: 27.4 10*3/uL — AB (ref 3.6–11.0)

## 2017-04-12 LAB — URINE CULTURE

## 2017-04-12 LAB — GLUCOSE, CAPILLARY
GLUCOSE-CAPILLARY: 153 mg/dL — AB (ref 65–99)
GLUCOSE-CAPILLARY: 169 mg/dL — AB (ref 65–99)
Glucose-Capillary: 185 mg/dL — ABNORMAL HIGH (ref 65–99)
Glucose-Capillary: 135 mg/dL — ABNORMAL HIGH (ref 65–99)
Glucose-Capillary: 125 mg/dL — ABNORMAL HIGH (ref 65–99)

## 2017-04-12 LAB — BASIC METABOLIC PANEL
ANION GAP: 11 (ref 5–15)
BUN: 23 mg/dL — ABNORMAL HIGH (ref 6–20)
CALCIUM: 8.4 mg/dL — AB (ref 8.9–10.3)
CHLORIDE: 98 mmol/L — AB (ref 101–111)
CO2: 24 mmol/L (ref 22–32)
CREATININE: 1.13 mg/dL — AB (ref 0.44–1.00)
GFR calc non Af Amer: 46 mL/min — ABNORMAL LOW (ref >60)
GFR, EST AFRICAN AMERICAN: 53 mL/min — AB (ref >60)
Glucose, Bld: 174 mg/dL — ABNORMAL HIGH (ref 65–99)
Potassium: 3.1 mmol/L — ABNORMAL LOW (ref 3.5–5.1)
SODIUM: 133 mmol/L — AB (ref 135–145)

## 2017-04-12 LAB — VANCOMYCIN, TROUGH: VANCOMYCIN TR: 28 ug/mL — AB (ref 15–20)

## 2017-04-12 LAB — ECHOCARDIOGRAM COMPLETE
Height: 70 in
WEIGHTICAEL: 4160 [oz_av]

## 2017-04-12 LAB — PROCALCITONIN: Procalcitonin: 6.13 ng/mL

## 2017-04-12 MED ORDER — HYDROMORPHONE HCL 1 MG/ML IJ SOLN
1.0000 mg | INTRAMUSCULAR | Status: AC
  Administered 2017-04-12: 1 mg via INTRAVENOUS
  Filled 2017-04-12: qty 1

## 2017-04-12 MED ORDER — IOPAMIDOL (ISOVUE-370) INJECTION 76%
75.0000 mL | Freq: Once | INTRAVENOUS | Status: AC | PRN
  Administered 2017-04-12: 75 mL via INTRAVENOUS

## 2017-04-12 MED ORDER — GABAPENTIN 300 MG PO CAPS
600.0000 mg | ORAL_CAPSULE | Freq: Two times a day (BID) | ORAL | Status: DC
  Administered 2017-04-12 – 2017-04-18 (×12): 600 mg via ORAL
  Filled 2017-04-12 (×12): qty 2

## 2017-04-12 MED ORDER — HYDROMORPHONE HCL 1 MG/ML IJ SOLN: 1.0000 mg | INTRAMUSCULAR | Status: DC | PRN

## 2017-04-12 MED ORDER — VANCOMYCIN HCL 10 G IV SOLR: 1250.0000 mg | INTRAVENOUS | Status: DC

## 2017-04-12 MED ORDER — HYDROMORPHONE HCL 1 MG/ML IJ SOLN
0.5000 mg | INTRAMUSCULAR | Status: DC | PRN
  Administered 2017-04-12 – 2017-04-15 (×12): 0.5 mg via INTRAVENOUS
  Filled 2017-04-12 (×2): qty 0.5
  Filled 2017-04-12: qty 1
  Filled 2017-04-12 (×2): qty 0.5
  Filled 2017-04-12: qty 1
  Filled 2017-04-12: qty 0.5
  Filled 2017-04-12: qty 1
  Filled 2017-04-12: qty 0.5
  Filled 2017-04-12 (×2): qty 1
  Filled 2017-04-12: qty 0.5

## 2017-04-12 NOTE — Progress Notes (Signed)
Susan Pratt INFECTIOUS DISEASE PROGRESS NOTE Date of Admission:  04/10/2017     ID: Susan Pratt is a 76 y.o. female with MRSA bacteremia Active Problems:   Sepsis ([place])   Pressure injury of skin   Subjective: Remains febrile. Wbc up to 27  ROS  Eleven systems are reviewed and negative except per hpi  Medications:  Antibiotics Given (last 72 hours)    Date/Time Action Medication Dose Rate   04/10/17 0752 New Bag/Given   piperacillin-tazobactam (ZOSYN) IVPB 3.375 g 3.375 g 100 mL/hr   04/10/17 0752 New Bag/Given   vancomycin (VANCOCIN) IVPB 1000 mg/200 mL premix 1,000 mg 200 mL/hr   04/10/17 1451 New Bag/Given   vancomycin (VANCOCIN) 1,250 mg in sodium chloride 0.9 % 250 mL IVPB 1,250 mg 166.7 mL/hr   04/10/17 1627 New Bag/Given   piperacillin-tazobactam (ZOSYN) IVPB 3.375 g 3.375 g 12.5 mL/hr   04/10/17 2308 New Bag/Given   piperacillin-tazobactam (ZOSYN) IVPB 3.375 g 3.375 g 12.5 mL/hr   04/11/17 0331 New Bag/Given   vancomycin (VANCOCIN) 1,250 mg in sodium chloride 0.9 % 250 mL IVPB 1,250 mg 166.7 mL/hr   04/11/17 0649 New Bag/Given   piperacillin-tazobactam (ZOSYN) IVPB 3.375 g 3.375 g 12.5 mL/hr   04/11/17 1601 New Bag/Given   piperacillin-tazobactam (ZOSYN) IVPB 3.375 g 3.375 g 12.5 mL/hr   04/11/17 1708 New Bag/Given  [pharmacy delay]   vancomycin (VANCOCIN) 1,250 mg in sodium chloride 0.9 % 250 mL IVPB 1,250 mg 166.7 mL/hr   04/12/17 0250 New Bag/Given   vancomycin (VANCOCIN) 1,250 mg in sodium chloride 0.9 % 250 mL IVPB 1,250 mg 166.7 mL/hr     . aspirin EC  81 mg Oral Daily  . Chlorhexidine Gluconate Cloth  6 each Topical Q0600  . cholecalciferol  1,000 Units Oral Daily  . enoxaparin (LOVENOX) injection  40 mg Subcutaneous Q24H  . gabapentin  600 mg Oral BID  . insulin aspart  0-20 Units Subcutaneous TID WC  . insulin aspart  0-5 Units Subcutaneous QHS  . multivitamin with minerals  1 tablet Oral Daily  . mupirocin ointment  1 application Nasal BID  .  oxybutynin  5 mg Oral BID  . vitamin B-12  1,000 mcg Oral Daily    Objective: Vital signs in last 24 hours: Temp:  [98.7 F (37.1 C)-102.5 F (39.2 C)] 98.7 F (37.1 C) (11/13 0800) Pulse Rate:  [65-122] 90 (11/13 1400) Resp:  [17-35] 23 (11/13 1400) BP: (94-162)/(52-124) 141/70 (11/13 1400) SpO2:  [86 %-100 %] 95 % (11/13 1400) FiO2 (%):  [35 %] 35 % (11/12 1955) Constitutional:  lethargic HENT: [place]/AT, PERRLA, no scleral icterus Mouth/Throat: Oropharynx is clear and dry. No oropharyngeal exudate.  Cardiovascular: Normal rate, regular rhythm and normal heart sounds.  Pulmonary/Chest: Effort normal and breath sounds normal. No respiratory distress.  has no wheezes.  Neck = supple, no nuchal rigidity Abdominal: Soft. Bowel sounds are normal.  exhibits no distension. There is no tenderness.  Lymphadenopathy: no cervical adenopathy. No axillary adenopathy Neurological: lethargi Skin: r heel wit shallow ulcer, L leg with 1 cm ulcer with some purulence Psychiatric: unable  Ext grimaces with passive rom of l hip    Lab Results Recent Labs    04/11/17 0333 04/12/17 1014  WBC 24.6* 27.4*  HGB 11.8* 13.2  HCT 36.1 40.5  NA 133* 133*  K 3.9 3.1*  CL 101 98*  CO2 24 24  BUN 19 23*  CREATININE 0.66 1.13*    Results for orders  placed or performed during the hospital encounter of 04/10/17  Urine culture     Status: Abnormal   Collection Time: 04/10/17  6:40 AM  Result Value Ref Range Status   Specimen Description URINE, RANDOM  Final   Special Requests NONE  Final   Culture >=100,000 COLONIES/mL ESCHERICHIA COLI (A)  Final   Report Status 04/12/2017 FINAL  Final   Organism ID, Bacteria ESCHERICHIA COLI (A)  Final      Susceptibility   Escherichia coli - MIC*    AMPICILLIN <=2 SENSITIVE Sensitive     CEFAZOLIN <=4 SENSITIVE Sensitive     CEFTRIAXONE <=1 SENSITIVE Sensitive     CIPROFLOXACIN <=0.25 SENSITIVE Sensitive     GENTAMICIN <=1 SENSITIVE Sensitive     IMIPENEM  <=0.25 SENSITIVE Sensitive     NITROFURANTOIN <=16 SENSITIVE Sensitive     TRIMETH/SULFA <=20 SENSITIVE Sensitive     AMPICILLIN/SULBACTAM <=2 SENSITIVE Sensitive     PIP/TAZO <=4 SENSITIVE Sensitive     Extended ESBL NEGATIVE Sensitive     * >=100,000 COLONIES/mL ESCHERICHIA COLI  Culture, blood (Routine x 2)     Status: Abnormal (Preliminary result)   Collection Time: 04/10/17  6:48 AM  Result Value Ref Range Status   Specimen Description BLOOD RIGHT ANTECUBITAL  Final   Special Requests   Final    BOTTLES DRAWN AEROBIC AND ANAEROBIC Blood Culture adequate volume   Culture  Setup Time   Final    GRAM POSITIVE COCCI IN BOTH AEROBIC AND ANAEROBIC BOTTLES CRITICAL RESULT CALLED TO, READ BACK BY AND VERIFIED WITH: JASON ROBBINS AT 2122 04/10/17.PMH    Culture (A)  Final    STAPHYLOCOCCUS AUREUS SUSCEPTIBILITIES TO FOLLOW Performed at [facility], [address] [place], [place] [address]    Report Status PENDING  Incomplete  Blood Culture ID Panel (Reflexed)     Status: Abnormal   Collection Time: 04/10/17  6:48 AM  Result Value Ref Range Status   Enterococcus species NOT DETECTED NOT DETECTED Final   Listeria monocytogenes NOT DETECTED NOT DETECTED Final   Staphylococcus species DETECTED (A) NOT DETECTED Final    Comment: CRITICAL RESULT CALLED TO, READ BACK BY AND VERIFIED WITH: JASON ROBBINS AT 2122 04/10/17.PMH    Staphylococcus aureus DETECTED (A) NOT DETECTED Final    Comment: Methicillin (oxacillin)-resistant Staphylococcus aureus (MRSA). MRSA is predictably resistant to beta-lactam antibiotics (except ceftaroline). Preferred therapy is vancomycin unless clinically contraindicated. Patient requires contact precautions if  hospitalized. CRITICAL RESULT CALLED TO, READ BACK BY AND VERIFIED WITH: JASON ROBBINS AT 2122 04/10/17.PMH    Methicillin resistance DETECTED (A) NOT DETECTED Final    Comment: CRITICAL RESULT CALLED TO, READ BACK BY AND VERIFIED WITH: JASON  ROBBINS AT 2122 04/10/17.PMH    Streptococcus species NOT DETECTED NOT DETECTED Final   Streptococcus agalactiae NOT DETECTED NOT DETECTED Final   Streptococcus pneumoniae NOT DETECTED NOT DETECTED Final   Streptococcus pyogenes NOT DETECTED NOT DETECTED Final   Acinetobacter baumannii NOT DETECTED NOT DETECTED Final   Enterobacteriaceae species NOT DETECTED NOT DETECTED Final   Enterobacter cloacae complex NOT DETECTED NOT DETECTED Final   Escherichia coli NOT DETECTED NOT DETECTED Final   Klebsiella oxytoca NOT DETECTED NOT DETECTED Final   Klebsiella pneumoniae NOT DETECTED NOT DETECTED Final   Proteus species NOT DETECTED NOT DETECTED Final   Serratia marcescens NOT DETECTED NOT DETECTED Final   Haemophilus influenzae NOT DETECTED NOT DETECTED Final   Neisseria meningitidis NOT DETECTED NOT DETECTED Final     Pseudomonas aeruginosa NOT DETECTED NOT DETECTED Final   Candida albicans NOT DETECTED NOT DETECTED Final   Candida glabrata NOT DETECTED NOT DETECTED Final   Candida krusei NOT DETECTED NOT DETECTED Final   Candida parapsilosis NOT DETECTED NOT DETECTED Final   Candida tropicalis NOT DETECTED NOT DETECTED Final  Culture, blood (Routine x 2)     Status: Abnormal (Preliminary result)   Collection Time: 04/10/17  7:38 AM  Result Value Ref Range Status   Specimen Description BLOOD RIGHT ASSIST CONTROL  Final   Special Requests   Final    BOTTLES DRAWN AEROBIC AND ANAEROBIC Blood Culture adequate volume   Culture  Setup Time   Final    GRAM POSITIVE COCCI IN BOTH AEROBIC AND ANAEROBIC BOTTLES CRITICAL RESULT CALLED TO, READ BACK BY AND VERIFIED WITH: JASON ROBBINS AT 2122 04/10/17.PMH    Culture (A)  Final    STAPHYLOCOCCUS AUREUS SUSCEPTIBILITIES TO FOLLOW Performed at [facility], [address] [place], [place] [address]    Report Status PENDING  Incomplete  MRSA PCR Screening     Status: Abnormal   Collection Time: 04/11/17  9:40 AM  Result Value Ref Range  Status   MRSA by PCR POSITIVE (A) NEGATIVE Final    Comment:        The GeneXpert MRSA Assay (FDA approved for NASAL specimens only), is one component of a comprehensive MRSA colonization surveillance program. It is not intended to diagnose MRSA infection nor to guide or monitor treatment for MRSA infections. RESULT CALLED TO, READ BACK BY AND VERIFIED WITH: BRANDY MANSFIELD @ 1108 04/11/17 T[facility]   Culture, blood (single) w Reflex to ID Panel     Status: None (Preliminary result)   Collection Time: 04/11/17  9:09 PM  Result Value Ref Range Status   Specimen Description BLOOD LEFT ARM  Final   Special Requests   Final    BOTTLES DRAWN AEROBIC AND ANAEROBIC Blood Culture adequate volume   Culture NO GROWTH < 12 HOURS  Final   Report Status PENDING  Incomplete    Studies/Results: Ct Angio Chest Pe W Or Wo Contrast  Result Date: 04/12/2017 CLINICAL DATA:  Acute onset of shortness of breath. Assess for source of sepsis. EXAM: CT ANGIOGRAPHY CHEST WITH CONTRAST TECHNIQUE: Multidetector CT imaging of the chest was performed using the standard protocol during bolus administration of intravenous contrast. Multiplanar CT image reconstructions and MIPs were obtained to evaluate the vascular anatomy. CONTRAST:  75mL ISOVUE-370 IOPAMIDOL (ISOVUE-370) INJECTION 76% COMPARISON:  Chest radiograph performed 04/11/2017, CT of the abdomen and pelvis performed 04/10/2017, and CTA of the chest performed 09/04/2012 FINDINGS: Cardiovascular:  There is no evidence of pulmonary embolus. Diffuse coronary artery calcifications are seen. The heart is mildly enlarged. Scattered calcification is noted along the aortic arch and descending thoracic aorta. Scattered calcification is noted along the proximal great vessels. Mediastinum/Nodes: Tracheomalacia is noted. The mediastinum is otherwise unremarkable. No mediastinal lymphadenopathy is seen. No pericardial effusion is identified. The visualized portions of the  thyroid gland are unremarkable. No axillary lymphadenopathy is appreciated. Lungs/Pleura: Trace right and small left pleural effusions are noted. Underlying right basilar airspace opacity may reflect atelectasis or possibly mild infection. No pneumothorax is seen. No dominant mass is identified. Upper Abdomen: The visualized portions of the liver and spleen are unremarkable. The patient is status post cholecystectomy, with clips noted along the gallbladder fossa. The visualized portions of the pancreas and adrenal glands are within normal limits. Nonspecific perinephric stranding   is noted bilaterally. A right renal cyst is noted. Musculoskeletal: There is prominent vague soft tissue density tracking about the intervertebral disc space at T9-T10, with new significant widening of the disc space and mild anterolisthesis of T9 on T10, suspicious for discitis and osteomyelitis. This is largely new from 2014. There is underlying chronic osseous fusion at T7 and T8, and prominent anterior bridging osteophytes along the lower thoracic spine. The visualized musculature is unremarkable in appearance. Review of the MIP images confirms the above findings. IMPRESSION: 1. No evidence of pulmonary embolus. 2. Prominent vague soft tissue density tracking about the intervertebral disc space at T9-T10, with new significant widening of the disc space since two days ago and mild anterolisthesis of T9 on T10, raising concern for relatively severe acute discitis and osteomyelitis. This is thought to reflect the source of infection, given the patient's sepsis. Underlying abscess cannot be excluded. Would correlate for clinical evidence of encroachment on the spinal canal, and recommend MRI of the thoracic spine with contrast for further evaluation if the patient can tolerate the study. 3. Trace right and small left pleural effusion. Underlying right basilar airspace opacity may reflect atelectasis or possibly mild infection. 4. Diffuse  coronary artery calcifications.  Mild cardiomegaly. 5. Tracheomalacia noted. 6. Right renal cyst seen. These results were called by telephone at the time of interpretation on 04/12/2017 at 5:16 am to Nursing at the [facility] ICU, who verbally acknowledged these results. Electronically Signed   By: Garald Balding M.D.   On: 04/12/2017 05:21   Dg Chest Port 1 View  Result Date: 04/12/2017 CLINICAL DATA:  Cardiomegaly respiratory failure . EXAM: PORTABLE CHEST 1 VIEW COMPARISON:  CT 04/12/2017.  Chest x-ray 04/11/2017. FINDINGS: Stable cardiomegaly. Interim improvement of aeration noted bilaterally suggesting improving interstitial edema and/or pneumonitis. Small residual left pleural effusion. No pneumothorax . IMPRESSION: 1.  Stable cardiomegaly. 2. Interim improvement of aeration noted bilaterally suggesting improving interstitial edema and/or pneumonitis. Small residual left pleural effusion . Electronically Signed   By: Marcello Moores  Register   On: 04/12/2017 07:21   Dg Chest Port 1 View  Result Date: 04/11/2017 CLINICAL DATA:  76 year old female with history of acute respiratory failure. EXAM: PORTABLE CHEST 1 VIEW COMPARISON:  Chest x-ray 04/10/2017. FINDINGS: New patchy airspace consolidation throughout the right lung, most confluent in the right upper lobe and medial aspect of the right lung base, as well as in the left base, concerning for multilobar pneumonia. Relative sparing of the left upper lobe. Possible small bilateral pleural effusions. No evidence of pulmonary edema. Heart size is mildly enlarged (unchanged). Upper mediastinal contours are within normal limits allowing for slight patient rotation the left. Aortic atherosclerosis. Followup PA and lateral chest X-ray is recommended in 3-4 weeks following trial of antibiotic therapy to ensure resolution and exclude underlying malignancy. Status post left shoulder arthroplasty. IMPRESSION: 1. Findings are compatible with multilobar pneumonia,  as above. Possible small bilateral pleural effusions. 2. Aortic atherosclerosis. 3. Mild cardiomegaly is unchanged. Electronically Signed   By: Vinnie Langton M.D.   On: 04/11/2017 19:43   Impressions:  - Normal LVEF. Limited visualization of the right heart and valves.   Study is inadequate for assessment of endocarditis.  Assessment/Plan: Susan Pratt is a 76 y.o. female with MRSA bacteremia and hip/back pain. Had L hip injection 11/1 for chronic pain at the site. She has RA and is on plaquenil and MTX as otpt. She does have some skin wounds as likely source. Some concern she  has seeded her back or L hip. Also has R TKR and prosthetic L shoulder but no evidence infection at those sites.  She also has a pelvic stimulator in place.  Has E coli in urine but UA with only 0-5 wbc so likely contaminant.  CRP 11, ESR 8  initially  CT 11/13 suggests acuteT9-10 discitis/osteomyelitis  Fu bcx 11/12 ngtd TTE neg but poor study.    Recommendations Continue vanco - at this point given findings on CTscan with T9-10 Discitis will need 6 week course minimum of IV vanco Since will need prolonged IV abx course no need for TEE unless she develops concern for valve rupture or valve ring abscess Once bcx neg for 48 hours will need PICC placed and min 2 weeks IV abx.  Repeat esr and CRP. I have cultured L calf wound Thank you very much for the consult. Will follow with you.   P    04/12/2017, 2:19 PM     

## 2017-04-12 NOTE — Progress Notes (Signed)
Episode of dyspnea last night.  CXR at that time consistent with pulmonary edema.  Diuresed with improvement.  Presently comfortable on [place] O2.   Vitals:   04/12/17 0600 04/12/17 0700 04/12/17 0800 04/12/17 0900  BP: 104/65 112/66 132/75 (!) 154/75  Pulse: 85 85 65   Resp: (!) 24 19 (!) 23 (!) 24  Temp:   98.7 F (37.1 C)   TempSrc:      SpO2: 99% 97% 96%   Weight:      Height:        NAD Mildly lethargic Poorly oriented HEENT WNL No JVD noted Minimal bibasilar crackles Regular, no M Obese, NABS Extremities warm, chronic stasis changes No focal neurologic deficits  BMP Latest Ref Rng & Units 04/12/2017 04/11/2017 04/10/2017  Glucose 65 - 99 mg/dL 174(H) 160(H) 217(H)  BUN 6 - 20 mg/dL 23(H) 19 26(H)  Creatinine 0.44 - 1.00 mg/dL 1.13(H) 0.66 0.85  BUN/Creat Ratio 11 - 26 - - -  Sodium 135 - 145 mmol/L 133(L) 133(L) 133(L)  Potassium 3.5 - 5.1 mmol/L 3.1(L) 3.9 4.6  Chloride 101 - 111 mmol/L 98(L) 101 99(L)  CO2 22 - 32 mmol/L 24 24 24   Calcium 8.9 - 10.3 mg/dL 8.4(L) 8.5(L) 9.4    CBC Latest Ref Rng & Units 04/12/2017 04/11/2017 04/10/2017  WBC 3.6 - 11.0 K/uL 27.4(H) 24.6(H) 21.0(H)  Hemoglobin 12.0 - 16.0 g/dL 13.2 11.8(L) 14.2  Hematocrit 35.0 - 47.0 % 40.5 36.1 44.0  Platelets 150 - 440 K/uL 275 342 448(H)    CXR: Improved edema CTA chest: No evidence of pulmonary embolus. Prominent vague soft tissue density tracking about the intervertebral disc space at T9-T10, with new significant widening of the disc space since two days ago and mild anterolisthesis of T9 on T10, raising concern for relatively severe acute discitis and osteomyelitis.  IMPRESSION: Severe sepsis Acute encephalopathy MRSA bacteremia Thoracic spine discitis and osteomyelitis Pulmonary edema after aggressive volume resuscitation - improved  PLAN/REC: Change to SDU status Continue vancomycin ID service following Family updated Decrease PRN Dilaudid dose  Merton Border, MD PCCM  service Mobile [phone] Pager [phone] 04/12/2017 1:15 PM

## 2017-04-12 NOTE — Progress Notes (Signed)
[facility] [place] NAME: Susan Pratt    MR#:  [id number]  DATE OF BIRTH:  [birth year]  SUBJECTIVE:   Patient here due to altered mental status and also noted to have significant back and/right hip pain. Status post recent steroid injection to the right hip. Patient is bacteremic secondary to MRSA. CT scan of the chest incidentally noted T9/10 discitis which is likely the source of patient's bacteremia. Appreciate infectious disease input and patient will likely need long-term IV antibiotics. Mental status much improved today, husband is at bedside. Currently afebrile and hemodynamically stable.  REVIEW OF SYSTEMS:   Review of Systems  Constitutional: Negative for chills and fever.  HENT: Negative for congestion and tinnitus.   Eyes: Negative for blurred vision and double vision.  Respiratory: Negative for cough, shortness of breath and wheezing.   Cardiovascular: Negative for chest pain, orthopnea and PND.  Gastrointestinal: Negative for abdominal pain, diarrhea, nausea and vomiting.  Genitourinary: Negative for dysuria and hematuria.  Musculoskeletal: Positive for back pain.  Neurological: Negative for dizziness, sensory change and focal weakness.  All other systems reviewed and are negative.  DRUG ALLERGIES:   Allergies  Allergen Reactions  . Lisinopril Swelling    Causes tongue swelling    VITALS:  Blood pressure 131/63, pulse 83, temperature 98.9 F (37.2 C), resp. rate (!) 22, height 5\' 10"  (1.778 m), weight 117.9 kg (260 lb), SpO2 100 %.  PHYSICAL EXAMINATION:   Physical Exam  GENERAL:  76 y.o.-year-old patient lying in the bed with no acute distress.  Appears ill EYES: Pupils equal, round, reactive to light and accommodation. No scleral icterus. Extraocular muscles intact.  HEENT: Head atraumatic, normocephalic. Oropharynx and nasopharynx dry NECK:  Supple, no jugular venous distention. No thyroid enlargement, no  tenderness.  LUNGS: Normal breath sounds bilaterally, no wheezing, rales, rhonchi. No use of accessory muscles of respiration.  CARDIOVASCULAR: S1, S2 normal. No murmurs, rubs, or gallops.  ABDOMEN: Soft, nontender, nondistended. Bowel sounds present. No organomegaly or mass.  EXTREMITIES: No cyanosis, clubbing or edema b/l.    NEUROLOGIC: No focal motor or sensory deficits appreciated bilaterally, globally weak. PSYCHIATRIC: AAO X 3.  Somewhat lethargic.   sKIN: Bilateral heel superficial small skin tears prior to admission  LABORATORY PANEL:  CBC Recent Labs  Lab 04/12/17 1014  WBC 27.4*  HGB 13.2  HCT 40.5  PLT 275    Chemistries  Recent Labs  Lab 04/10/17 0640  04/11/17 1936 04/12/17 1014  NA 133*   < >  --  133*  K 4.6   < >  --  3.1*  CL 99*   < >  --  98*  CO2 24   < >  --  24  GLUCOSE 217*   < >  --  174*  BUN 26*   < >  --  23*  CREATININE 0.85   < >  --  1.13*  CALCIUM 9.4   < >  --  8.4*  MG  --   --  1.8  --   AST 16  --   --   --   ALT 29  --   --   --   ALKPHOS 67  --   --   --   BILITOT 1.6*  --   --   --    < > = values in this interval not displayed.   Cardiac Enzymes Recent Labs  Lab 04/10/17 [phone]  TROPONINI <0.03   RADIOLOGY:  Ct Angio Chest Pe W Or Wo Contrast  Result Date: 04/12/2017 CLINICAL DATA:  Acute onset of shortness of breath. Assess for source of sepsis. EXAM: CT ANGIOGRAPHY CHEST WITH CONTRAST TECHNIQUE: Multidetector CT imaging of the chest was performed using the standard protocol during bolus administration of intravenous contrast. Multiplanar CT image reconstructions and MIPs were obtained to evaluate the vascular anatomy. CONTRAST:  28mL ISOVUE-370 IOPAMIDOL (ISOVUE-370) INJECTION 76% COMPARISON:  Chest radiograph performed 04/11/2017, CT of the abdomen and pelvis performed 04/10/2017, and CTA of the chest performed 09/04/2012 FINDINGS: Cardiovascular:  There is no evidence of pulmonary embolus. Diffuse coronary artery  calcifications are seen. The heart is mildly enlarged. Scattered calcification is noted along the aortic arch and descending thoracic aorta. Scattered calcification is noted along the proximal great vessels. Mediastinum/Nodes: Tracheomalacia is noted. The mediastinum is otherwise unremarkable. No mediastinal lymphadenopathy is seen. No pericardial effusion is identified. The visualized portions of the thyroid gland are unremarkable. No axillary lymphadenopathy is appreciated. Lungs/Pleura: Trace right and small left pleural effusions are noted. Underlying right basilar airspace opacity may reflect atelectasis or possibly mild infection. No pneumothorax is seen. No dominant mass is identified. Upper Abdomen: The visualized portions of the liver and spleen are unremarkable. The patient is status post cholecystectomy, with clips noted along the gallbladder fossa. The visualized portions of the pancreas and adrenal glands are within normal limits. Nonspecific perinephric stranding is noted bilaterally. A right renal cyst is noted. Musculoskeletal: There is prominent vague soft tissue density tracking about the intervertebral disc space at T9-T10, with new significant widening of the disc space and mild anterolisthesis of T9 on T10, suspicious for discitis and osteomyelitis. This is largely new from 2014. There is underlying chronic osseous fusion at T7 and T8, and prominent anterior bridging osteophytes along the lower thoracic spine. The visualized musculature is unremarkable in appearance. Review of the MIP images confirms the above findings. IMPRESSION: 1. No evidence of pulmonary embolus. 2. Prominent vague soft tissue density tracking about the intervertebral disc space at T9-T10, with new significant widening of the disc space since two days ago and mild anterolisthesis of T9 on T10, raising concern for relatively severe acute discitis and osteomyelitis. This is thought to reflect the source of infection, given  the patient's sepsis. Underlying abscess cannot be excluded. Would correlate for clinical evidence of encroachment on the spinal canal, and recommend MRI of the thoracic spine with contrast for further evaluation if the patient can tolerate the study. 3. Trace right and small left pleural effusion. Underlying right basilar airspace opacity may reflect atelectasis or possibly mild infection. 4. Diffuse coronary artery calcifications.  Mild cardiomegaly. 5. Tracheomalacia noted. 6. Right renal cyst seen. These results were called by telephone at the time of interpretation on 04/12/2017 at 5:16 am to Nursing at the [facility] ICU, who verbally acknowledged these results. Electronically Signed   By: Garald Balding M.D.   On: 04/12/2017 05:21   Dg Chest Port 1 View  Result Date: 04/12/2017 CLINICAL DATA:  Cardiomegaly respiratory failure . EXAM: PORTABLE CHEST 1 VIEW COMPARISON:  CT 04/12/2017.  Chest x-ray 04/11/2017. FINDINGS: Stable cardiomegaly. Interim improvement of aeration noted bilaterally suggesting improving interstitial edema and/or pneumonitis. Small residual left pleural effusion. No pneumothorax . IMPRESSION: 1.  Stable cardiomegaly. 2. Interim improvement of aeration noted bilaterally suggesting improving interstitial edema and/or pneumonitis. Small residual left pleural effusion . Electronically Signed   By: Marcello Moores  Register   On: 04/12/2017  07:21   Dg Chest Port 1 View  Result Date: 04/11/2017 CLINICAL DATA:  76 year old female with history of acute respiratory failure. EXAM: PORTABLE CHEST 1 VIEW COMPARISON:  Chest x-ray 04/10/2017. FINDINGS: New patchy airspace consolidation throughout the right lung, most confluent in the right upper lobe and medial aspect of the right lung base, as well as in the left base, concerning for multilobar pneumonia. Relative sparing of the left upper lobe. Possible small bilateral pleural effusions. No evidence of pulmonary edema. Heart size is mildly  enlarged (unchanged). Upper mediastinal contours are within normal limits allowing for slight patient rotation the left. Aortic atherosclerosis. Followup PA and lateral chest X-ray is recommended in 3-4 weeks following trial of antibiotic therapy to ensure resolution and exclude underlying malignancy. Status post left shoulder arthroplasty. IMPRESSION: 1. Findings are compatible with multilobar pneumonia, as above. Possible small bilateral pleural effusions. 2. Aortic atherosclerosis. 3. Mild cardiomegaly is unchanged. Electronically Signed   By: Vinnie Langton M.D.   On: 04/11/2017 19:43   ASSESSMENT AND PLAN:   76 year old female with past medical history of arthritis, diabetes, hypertension comes to the emergency room with altered mental status and fever.  She has been complaining of chronic back pain and left hip pain. She had a steroid injection a left hip last Thursday as an outpatient.  1. MRSA bacteremia-this is a source of patient's sepsis. 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 Cannot do MRI of the thoracic/lumbar sinus patient has a spinal stimulator. -Appreciate infectious disease input and patient will likely need long-term IV antibiotics. Continue IV vancomycin for at least 6 weeks. Await repeat blood cultures if negative for likely place a PICC line.  -Await TTE results. No plan for doing a TEE for now. -Continue supportive care for now. Left calf wound was cultured by infectious disease and will follow up culture results.   2. Metabolic encephalopathy. Likely secondary to the sepsis. - improved today and will cont. To monitor.   3. Left hip bursitis. As noted above she had a steroid injection on Thursday. There is some bruising around the site. CT scan of the abdomen got some cuts down that area and do not see any obvious abscess.  pt was seen by orthopedic and no acute intervention or evidence of Hip infection.    4. Chronic  pain-  continue oxycodone, IV Dilaudid.   5. Diabetes - continue sliding scale insulin. Blood sugar stable.  6. Hypokalemia-replace accordingly and repeat level in the morning.   Case discussed with Care Management/Social Worker.  CODE STATUS: Full  DVT Prophylaxis: Lovenox  TOTAL CRITICAL TIME TAKING CARE OF THIS PATIENT: 30 minutes.    Note: This dictation was prepared with Dragon dictation along with smaller phrase technology. Any transcriptional errors that result from this process are unintentional.  Henreitta Leber M.D on 04/12/2017 at 3:27 PM  Between 7am to 6pm - Pager - [phone]  After 6pm go to [email or web address] - password [email or web address] [facility]  Office  [phone]  CC: Primary care physician; Adline Potter, MD

## 2017-04-12 NOTE — Progress Notes (Addendum)
Pharmacy Antibiotic Note  Susan Pratt is a 76 y.o. female admitted on 04/10/2017 with sepsis.  Pharmacy has been consulted for vancomycin dosing.  11/13: Patient received IV contrast and SCr 1.13 (0.66)  Plan: Vancomycin held in anticipation of elevated level. Will recheck vancomycin level in 12 hours and redose from there. Vancomycin order left as placeholder.   Height: 5\' 10"  (177.8 cm) Weight: 260 lb (117.9 kg) IBW/kg (Calculated) : 68.5  Temp (24hrs), Avg:100.2 F (37.9 C), Min:98.7 F (37.1 C), Max:102.5 F (39.2 C)  Recent Labs  Lab 04/10/17 0640 04/11/17 0333 04/12/17 1014 04/12/17 1510  WBC 21.0* 24.6* 27.4*  --   CREATININE 0.85 0.66 1.13*  --   LATICACIDVEN 1.5  --   --   --   VANCOTROUGH  --   --   --  28*    Estimated Creatinine Clearance: 58.1 mL/min (A) (by C-G formula based on SCr of 1.13 mg/dL (H)).    Allergies  Allergen Reactions  . Lisinopril Swelling    Causes tongue swelling    Antimicrobials this admission: Zosyn 11/11 >> 11/12 Vancomycin 11/11 >>   Microbiology results: 11/11 BCx: MRSA 11/11 UCx: E coli  11/12 MRSA PCR: Positive  Thank you for allowing pharmacy to be a part of this patient's care.  Ulice Dash, PharmD Clinical Pharmacist  04/12/2017 3:45 PM

## 2017-04-12 NOTE — Consult Note (Signed)
Name: Susan Pratt MRN: [id number] DOB: 1940/06/11    ADMISSION DATE:  04/10/2017 CONSULTATION DATE: 04/12/2017  REFERRING MD : Dr. Verdell Carmine  CHIEF COMPLAINT: Back Pain   BRIEF PATIENT DESCRIPTION:  76 yo female admitted 11/11 with moderate to severe back pain and sepsis secondary to MRSA bacteremia transferred to ICU 11/12 with acute encephalopathy and acute hypoxic respiratory failure   SIGNIFICANT EVENTS  11/11-Pt admitted to [facility] unit  11/12-Pt transferred to ICU   STUDIES:  CT Abd Pelvis 11/11>>Urinary bladder wall thickness is normal. No renal or ureteral calculus. No hydronephrosis. No bowel obstruction.  No abscess.  Appendix absent. Widespread thoracic and lumbar arthropathy. Extensive bony overgrowth at multiple sites. Aortoiliac atherosclerosis. No aneurysm. No mesenteric ischemia appreciable by CT. Prominent liver without focal lesion evident. Mild prominence of the common bile duct evident without mass or calculus evident within the biliary ductal system. Gallbladder absent. Evidence a degree of vaginal prolapse.  Uterus absent. Stimulator in the posterior right pelvic region with stimulator lead extending into the region of the right sacral plexus. Small ventral hernia containing only fat. Muscle atrophy left proximal thigh region Echo 11/12>>  HISTORY OF PRESENT ILLNESS:   This is a 76 yo female with a PMH of Pulmonary Embolism (dx 2015-not on anticoagulation), Pelvic Stimulator, Left Total Shoulder Arthroplasty (10/23/2015), Peripheral Neuropathy, HTN, Hyperlipidemia, Diabetes Mellitus, Diabetic Neuropathy, Cervical Cancer, Bronchospastic Airway Disease, and Rheumatoid Arthritis.  She presented to [facility] ER 11/11 with c/o moderate to severe mid back pain, nausea, and left hip pain although the back pain was primary source of discomfort onset of symptoms 11/10. She has been wheelchair bound due to leg weakness and severe diabetic neuropathy, she saw neurology in the  outpatient setting on 11/1 due to bilateral hip pain left worse than right and was referred to orthopedic for steroid injection due to bilateral trochanteric bursitis.  On 11/1 she received a left hip greater trochanter cortisone injection at orthopedic office with slight improvement of pain initially, however the pain returned. In the ER the pt was febrile and tachycardic with leukocytosis concerning for sepsis, therefore she received broad spectrum iv abx.  She was subsequently admitted to the [facility] unit by hospitalist team for further workup and treatment.  On 11/12 pt required transfer to ICU due to acute encephalopathy and acute hypoxic respiratory failure PCCM consulted for additional management.   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  has a past surgical history that includes Knee surgery; Eye surgery; Abdominal hysterectomy; Appendectomy; Cholecystectomy; Joint replacement; Back surgery; Bladder surgery; and REVERSE SHOULDER ARTHROPLASTY (Left, 10/23/2015). Prior to Admission medications   Medication Sig Start Date End Date Taking? Authorizing Provider  amLODipine (NORVASC) 10 MG tablet Take 1 tablet (10 mg total) by mouth daily. 10/08/15  Yes Plonk, Gwyndolyn Saxon, MD  aspirin EC 81 MG tablet Take 81 mg by mouth daily.   Yes [provider]  atenolol (TENORMIN) 50 MG tablet Take by mouth. 04/15/11  Yes [provider]  cholecalciferol (VITAMIN D) 1000 units tablet Take 1,000 Units by mouth daily.   Yes [provider]  gabapentin (NEURONTIN) 300 MG capsule Take 2 capsules 4 (four) times daily by mouth.  10/10/13  Yes [provider]  meloxicam (MOBIC) 15 MG tablet TAKE 1 TABLEY BY MOUTH ONCE DAILY AS NEEDED FOR PAIN  10/10/13  Yes  [provider]  metFORMIN (GLUCOPHAGE) 500 MG tablet Take 1 tablet (500 mg total) by mouth 2 (two) times daily with a meal. 11/11/15  Yes Plonk, Gwyndolyn Saxon, MD  Multiple Vitamin (MULTIVITAMIN) capsule Take 1 capsule by mouth daily.   Yes [provider]  oxybutynin (DITROPAN) 5 MG tablet Take 5 mg by mouth. 03/20/15  Yes [provider]  traMADol (ULTRAM) 50 MG tablet Take 1-2 tablets (50-100 mg total) by mouth every 6 (six) hours as needed for moderate pain. 10/24/15  Yes Reche Dixon, PA-C  vitamin B-12 (CYANOCOBALAMIN) 1000 MCG tablet Take 1,000 mcg by mouth daily.   Yes [provider]  albuterol (PROAIR HFA) 108 (90 Base) MCG/ACT inhaler Inhale into the lungs. 12/31/13   [provider]  glucose blood (BAYER CONTOUR NEXT TEST) test strip  01/19/13   [provider]  glucose blood test strip Use as instructed 10/23/15   Plonk, Gwyndolyn Saxon, MD  oxyCODONE (OXY IR/ROXICODONE) 5 MG immediate release tablet Take 1-2 tablets (5-10 mg total) by mouth every 3 (three) hours as needed for breakthrough pain. Patient not taking: Reported on 04/10/2017 10/24/15   Reche Dixon, PA-C  sulfamethoxazole-trimethoprim (BACTRIM DS,SEPTRA DS) 800-160 MG tablet Take 1 tablet by mouth 2 (two) times daily. Patient not taking: Reported on 04/10/2017 10/17/15   Adline Potter, MD   Allergies  Allergen Reactions  . Lisinopril Swelling    Causes tongue swelling    FAMILY HISTORY:  family history includes Cancer in her mother; Diabetes in her mother; Heart attack in her father. SOCIAL HISTORY:  reports that  has never smoked. she has never used smokeless tobacco. She reports that she does not drink alcohol or use drugs.  REVIEW OF SYSTEMS:   Unable to assess pt remains on Bipap   SUBJECTIVE:  Overnight pt developed severe respiratory distress with respiratory rate upper 40's, tachycardia hr 130's, and restlessness requiring continuous Bipap and iv lasix x1 dose  VITAL  SIGNS: Temp:  [100.4 F (38 C)-102.5 F (39.2 C)] 102.5 F (39.2 C) (11/13 0200) Pulse Rate:  [77-122] 110 (11/13 0325) Resp:  [20-38] 20 (11/13 0325) BP: (94-146)/(49-83) 125/66 (11/13 0325) SpO2:  [85 %-100 %] 96 % (11/13 0325) FiO2 (%):  [35 %] 35 % (11/12 1955)  PHYSICAL EXAMINATION: General: acutely ill appearing female, well developed, mild respiratory distress   Neuro: lethargic and restless, intermittently follows commands  HEENT: supple, no JVD  Cardiovascular: sinus tach with BBB, no M/R/G  Lungs: fine crackles and diminished throughout, slightly labored  Abdomen: +BS x4, obese, soft, non tender, non distended  Musculoskeletal: normal bulk and tone, no edema  Skin: stage II buttocks pressure ulcer, scattered bilateral venous ulcerations  Recent Labs  Lab 04/10/17 0640 04/11/17 0333  NA 133* 133*  K 4.6 3.9  CL 99* 101  CO2 24 24  BUN 26* 19  CREATININE 0.85 0.66  GLUCOSE 217* 160*   Recent Labs  Lab 04/10/17 0640 04/11/17 0333  HGB 14.2 11.8*  HCT 44.0 36.1  WBC 21.0* 24.6*  PLT 448* 342   Ct Abdomen Pelvis W Contrast  Result Date: 04/10/2017 CLINICAL DATA:  Abdominal and back pain with nausea.  Dysuria. EXAM: CT ABDOMEN AND PELVIS WITH CONTRAST TECHNIQUE: Multidetector CT imaging of the abdomen and pelvis was performed using the standard protocol following bolus administration of intravenous contrast. CONTRAST:  162mL ISOVUE-300 IOPAMIDOL (ISOVUE-300) INJECTION 61% COMPARISON:  February 08, 2008 FINDINGS: Lower chest: There is atelectatic change in the  lung bases, more on the left than on the right. Heart appears prominent. There are foci of coronary artery calcification. Hepatobiliary: Liver measures 19.8 cm in length. No focal liver lesions are appreciable. Gallbladder is absent. The common bile duct measures 11 mm, mildly enlarged. No biliary duct mass or calculus evident. Pancreas: No pancreatic mass or inflammatory focus evident. Spleen: No splenic  lesions are evident. Adrenals/Urinary Tract: Adrenals appear unremarkable. 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 There is a cyst arising from the medial left kidney lower pole region measuring 1.9 x 1.3 cm. There are parapelvic cysts in the left kidney, largest measuring 1.5 x 1.5 cm. There is no appreciable hydronephrosis on either side. There is no renal or ureteral calculus on either side. Urinary bladder is midline with wall thickness within normal limits. Stomach/Bowel: There is no appreciable bowel wall or mesenteric thickening. There is moderate stool throughout the colon. There is no bowel obstruction. No bowel pneumatosis. There is no free air or portal venous air. Vascular/Lymphatic: There is atherosclerotic calcification in the aorta and iliac arteries. Aorta is tortuous. No aneurysm. Major mesenteric vessels appear patent. No adenopathy is evident in the abdomen or pelvis. Reproductive: Uterus is absent. There appears to be a degree of vaginal prolapse. No pelvic mass evident. Other: Appendix absent. No abscess or ascites is appreciable in the abdomen or pelvis. There is a small ventral hernia containing only fat. A stimulator is noted posteriorly on the right. Lead from the stimulator is seen anterior to the lower sacrum on the right in the right sacral plexus region. Musculoskeletal: There is extensive degenerative type change throughout the lower thoracic and lumbar spine regions. There are no blastic or lytic bone lesions. There is fatty infiltration throughout the gluteal muscles. There is atrophy of the proximal thigh muscles laterally on the left. IMPRESSION: 1. Urinary bladder wall thickness is normal. No renal or ureteral calculus. No hydronephrosis. 2.  No bowel obstruction.  No abscess.  Appendix absent. 3. Widespread thoracic and lumbar arthropathy. Extensive bony overgrowth at multiple sites. 4. Aortoiliac atherosclerosis. No aneurysm. No mesenteric  ischemia appreciable by CT. 5. Prominent liver without focal lesion evident. Mild prominence of the common bile duct evident without mass or calculus evident within the biliary ductal system. Gallbladder absent. 6.  Evidence a degree of vaginal prolapse.  Uterus absent. 7. Stimulator in the posterior right pelvic region with stimulator lead extending into the region of the right sacral plexus. 8.  Small ventral hernia containing only fat. 9.  Muscle atrophy left proximal thigh region. Aortic Atherosclerosis (ICD10-I70.0). Electronically Signed   By: Lowella Grip III M.D.   On: 04/10/2017 10:05   Dg Chest Port 1 View  Result Date: 04/11/2017 CLINICAL DATA:  76 year old female with history of acute respiratory failure. EXAM: PORTABLE CHEST 1 VIEW COMPARISON:  Chest x-ray 04/10/2017. FINDINGS: New patchy airspace consolidation throughout the right lung, most confluent in the right upper lobe and medial aspect of the right lung base, as well as in the left base, concerning for multilobar pneumonia. Relative sparing of the left upper lobe. Possible small bilateral pleural effusions. No evidence of pulmonary edema. Heart size is mildly enlarged (unchanged). Upper mediastinal contours are within normal limits allowing for slight patient rotation the left. Aortic atherosclerosis. Followup PA and lateral chest X-ray is recommended in 3-4 weeks following trial of antibiotic therapy to ensure resolution and exclude underlying malignancy. Status post left shoulder arthroplasty. IMPRESSION: 1.  Findings are compatible with multilobar pneumonia, as above. Possible small bilateral pleural effusions. 2. Aortic atherosclerosis. 3. Mild cardiomegaly is unchanged. Electronically Signed   By: Vinnie Langton M.D.   On: 04/11/2017 19:43   Dg Chest Port 1 View  Result Date: 04/10/2017 CLINICAL DATA:  Back pain.  History of pulmonary embolus. EXAM: PORTABLE CHEST 1 VIEW COMPARISON:  Chest CT 09/04/2012 FINDINGS: The cardiac  silhouette is enlarged. Mediastinal contours appear intact. Tortuosity and atherosclerotic disease of the aorta. There is no evidence of focal airspace consolidation, pleural effusion or pneumothorax. Mild bilateral lower lobe atelectasis. Mild bilateral pleural thickening, stable. Osseous structures are without acute abnormality. Soft tissues are grossly normal. IMPRESSION: Mildly enlarged heart. Calcific atherosclerotic disease and tortuosity of the aorta. Persistent mild bilateral pleural thickening. Electronically Signed   By: Fidela Salisbury M.D.   On: 04/10/2017 07:37    ASSESSMENT / PLAN: Sepsis secondary to MRSA bacteremia (concern for possible septic arthritis of left hip or infected bursa)  Acute encephalopathy  Acute hypoxic respiratory failure secondary to pulmonary edema  Left hip/back pain  Stage II Buttocks Pressure Ulcer  Hx: Pulmonary Embolism (dx 2015 not on anticoagulation), Wheelchair bound, Pelvic Stimulator, Left Total Shoulder Arthroplasty (10/23/2015), Diabetes Mellitus, Bronchospastic Airway Disease, and Rheumatoid Arthritis    P: Prn Bipap for dyspnea and/or hypoxia Repeat CXR  Prn bronchodilator therapy  CT Angio Chest r/o pulmonary embolism  Continuous telemetry monitoring Echo results pending Prn precedex gtt for agitation  Prn morphine and oxycodone for pain management  ID and Orthopedic consulted appreciate input  Trend WBC and monitor fever curve Trend PCT  Follow cultures Continue iv vancomycin per ID recommendations-will need PICC line placement once BCX negative for 48 hrs for 2 weeks IV abx  Lovenox for VTE prophylaxis Trend CBC Monitor for s/sx of bleeding SSI  Will need physical therapy once respiratory status improved   Marda Stalker, [place] Pager [phone] (please enter 7 digits) PCCM Consult Pager [phone] (please enter 7 digits)

## 2017-04-12 NOTE — Progress Notes (Signed)
Better day. Respirations less labored. Oxygen on 2 liters nasal [place]ulla all day. Confused at times which may be her baseline. Husband in and out- mostly out .Poor appetite with very little intake.Output good.

## 2017-04-13 DIAGNOSIS — R52 Pain, unspecified: Secondary | ICD-10-CM

## 2017-04-13 LAB — BASIC METABOLIC PANEL
ANION GAP: 11 (ref 5–15)
BUN: 25 mg/dL — ABNORMAL HIGH (ref 6–20)
CALCIUM: 8.9 mg/dL (ref 8.9–10.3)
CHLORIDE: 97 mmol/L — AB (ref 101–111)
CO2: 26 mmol/L (ref 22–32)
Creatinine, Ser: 1.09 mg/dL — ABNORMAL HIGH (ref 0.44–1.00)
GFR calc Af Amer: 55 mL/min — ABNORMAL LOW (ref >60)
GFR calc non Af Amer: 48 mL/min — ABNORMAL LOW (ref >60)
GLUCOSE: 154 mg/dL — AB (ref 65–99)
Potassium: 3.5 mmol/L (ref 3.5–5.1)
Sodium: 134 mmol/L — ABNORMAL LOW (ref 135–145)

## 2017-04-13 LAB — CULTURE, BLOOD (ROUTINE X 2)
SPECIAL REQUESTS: ADEQUATE
Special Requests: ADEQUATE

## 2017-04-13 LAB — PROCALCITONIN: Procalcitonin: 4.16 ng/mL

## 2017-04-13 LAB — CBC
HCT: 38.3 % (ref 35.0–47.0)
HEMOGLOBIN: 12.4 g/dL (ref 12.0–16.0)
MCH: 30.4 pg (ref 26.0–34.0)
MCHC: 32.5 g/dL (ref 32.0–36.0)
MCV: 93.3 fL (ref 80.0–100.0)
Platelets: 346 10*3/uL (ref 150–440)
RBC: 4.1 MIL/uL (ref 3.80–5.20)
RDW: 15 % — ABNORMAL HIGH (ref 11.5–14.5)
WBC: 22.9 10*3/uL — ABNORMAL HIGH (ref 3.6–11.0)

## 2017-04-13 LAB — SEDIMENTATION RATE: SED RATE: 79 mm/h — AB (ref 0–30)

## 2017-04-13 LAB — GLUCOSE, CAPILLARY
GLUCOSE-CAPILLARY: 142 mg/dL — AB (ref 65–99)
Glucose-Capillary: 170 mg/dL — ABNORMAL HIGH (ref 65–99)
Glucose-Capillary: 176 mg/dL — ABNORMAL HIGH (ref 65–99)
Glucose-Capillary: 152 mg/dL — ABNORMAL HIGH (ref 65–99)

## 2017-04-13 LAB — C-REACTIVE PROTEIN: CRP: 28.4 mg/dL — ABNORMAL HIGH (ref <1.0)

## 2017-04-13 LAB — VANCOMYCIN, RANDOM: VANCOMYCIN RM: 17

## 2017-04-13 MED ORDER — HYDROMORPHONE HCL 1 MG/ML IJ SOLN
1.0000 mg | INTRAMUSCULAR | Status: AC
  Administered 2017-04-13: 1 mg via INTRAVENOUS
  Filled 2017-04-13: qty 1

## 2017-04-13 MED ORDER — TRAMADOL HCL 50 MG PO TABS
50.0000 mg | ORAL_TABLET | Freq: Four times a day (QID) | ORAL | Status: DC | PRN
  Administered 2017-04-13: 50 mg via ORAL
  Filled 2017-04-13: qty 1

## 2017-04-13 MED ORDER — VANCOMYCIN HCL 10 G IV SOLR
1250.0000 mg | INTRAVENOUS | Status: DC
  Administered 2017-04-13 – 2017-04-15 (×3): 1250 mg via INTRAVENOUS
  Filled 2017-04-13 (×3): qty 1250

## 2017-04-13 MED ORDER — TRAMADOL HCL 50 MG PO TABS
50.0000 mg | ORAL_TABLET | ORAL | Status: AC
Start: 2017-04-13 — End: 2017-04-13
  Administered 2017-04-13: 50 mg via ORAL

## 2017-04-13 MED ORDER — TRAMADOL HCL 50 MG PO TABS
100.0000 mg | ORAL_TABLET | Freq: Four times a day (QID) | ORAL | Status: DC | PRN
Start: 2017-04-13 — End: 2017-04-18
  Administered 2017-04-13 – 2017-04-18 (×14): 100 mg via ORAL
  Filled 2017-04-13 (×15): qty 2

## 2017-04-13 MED ORDER — POLYETHYLENE GLYCOL 3350 17 G PO PACK
17.0000 g | PACK | Freq: Every day | ORAL | Status: DC
  Administered 2017-04-13 – 2017-04-18 (×6): 17 g via ORAL
  Filled 2017-04-13 (×6): qty 1

## 2017-04-13 NOTE — Progress Notes (Signed)
Cognition improved Comfortable on [place] O2 Complains of severe thoracic pain No overt distress  Vitals:   04/13/17 0900 04/13/17 1000 04/13/17 1100 04/13/17 1200  BP: (!) 147/80 (!) 148/75 (!) 158/80 (!) 169/109  Pulse: 98 (!) 101 (!) 101 97  Resp: (!) 23 (!) 22 (!) 22 (!) 24  Temp:      TempSrc:      SpO2: 98% 98% 98% 99%  Weight:      Height:        NAD Oriented to person and place HEENT WNL No JVD noted No wheezes Regular, no M Obese, NABS Extremities warm, chronic stasis changes No focal neurologic deficits  BMP Latest Ref Rng & Units 04/13/2017 04/12/2017 04/11/2017  Glucose 65 - 99 mg/dL 154(H) 174(H) 160(H)  BUN 6 - 20 mg/dL 25(H) 23(H) 19  Creatinine 0.44 - 1.00 mg/dL 1.09(H) 1.13(H) 0.66  BUN/Creat Ratio 11 - 26 - - -  Sodium 135 - 145 mmol/L 134(L) 133(L) 133(L)  Potassium 3.5 - 5.1 mmol/L 3.5 3.1(L) 3.9  Chloride 101 - 111 mmol/L 97(L) 98(L) 101  CO2 22 - 32 mmol/L 26 24 24   Calcium 8.9 - 10.3 mg/dL 8.9 8.4(L) 8.5(L)    CBC Latest Ref Rng & Units 04/13/2017 04/12/2017 04/11/2017  WBC 3.6 - 11.0 K/uL 22.9(H) 27.4(H) 24.6(H)  Hemoglobin 12.0 - 16.0 g/dL 12.4 13.2 11.8(L)  Hematocrit 35.0 - 47.0 % 38.3 40.5 36.1  Platelets 150 - 440 K/uL 346 275 342    CXR: No new film   IMPRESSION: MRSA bacteremia Severe sepsis  Acute encephalopathy -resolved Thoracic spine discitis and osteomyelitis Severe thoracic pain Pulmonary edema after aggressive volume resuscitation -resolved  PLAN/REC: Transfer to [place] ordered today Continue vancomycin ID service following Family updated Continue PRN Dilaudid and oxycodone    After transfer, PCCM will sign off. Please call if we can be of further assistance  Merton Border, MD PCCM service Mobile [phone] Pager [phone] 04/13/2017 12:49 PM

## 2017-04-13 NOTE — Progress Notes (Signed)
Spoke with husband requested to find out if insurance will cover PICC line. I have called case manager? I told him that I did not know.

## 2017-04-13 NOTE — Progress Notes (Signed)
Patient wants to know if health insurance cover PICC placement, floor RN will update IV team when case maneger call back.

## 2017-04-13 NOTE — Progress Notes (Signed)
[facility] INFECTIOUS DISE[facility] PROGRESS NOTE Date of Admission:  04/10/2017     ID: Susan Pratt is a 76 y.o. female with MRSA bacteremia Active Problems:   Sepsis ([place])   Pressure injury of skin   Subjective: Afebrile. Wbc down to 22. CO back and chest pain.   ROS  Eleven systems are reviewed and negative except per hpi  Medications:  Antibiotics Given (last 72 hours)    Date/Time Action Medication Dose Rate   04/10/17 1627 New Bag/Given   piperacillin-tazobactam (ZOSYN) IVPB 3.375 g 3.375 g 12.5 mL/hr   04/10/17 2308 New Bag/Given   piperacillin-tazobactam (ZOSYN) IVPB 3.375 g 3.375 g 12.5 mL/hr   04/11/17 0331 New Bag/Given   vancomycin (VANCOCIN) 1,250 mg in sodium chloride 0.9 % 250 mL IVPB 1,250 mg 166.7 mL/hr   04/11/17 0649 New Bag/Given   piperacillin-tazobactam (ZOSYN) IVPB 3.375 g 3.375 g 12.5 mL/hr   04/11/17 1601 New Bag/Given   piperacillin-tazobactam (ZOSYN) IVPB 3.375 g 3.375 g 12.5 mL/hr   04/11/17 1708 New Bag/Given  [pharmacy delay]   vancomycin (VANCOCIN) 1,250 mg in sodium chloride 0.9 % 250 mL IVPB 1,250 mg 166.7 mL/hr   04/12/17 0250 New Bag/Given   vancomycin (VANCOCIN) 1,250 mg in sodium chloride 0.9 % 250 mL IVPB 1,250 mg 166.7 mL/hr   04/13/17 0926 New Bag/Given   vancomycin (VANCOCIN) 1,250 mg in sodium chloride 0.9 % 250 mL IVPB 1,250 mg 166.7 mL/hr     . aspirin EC  81 mg Oral Daily  . Chlorhexidine Gluconate Cloth  6 each Topical Q0600  . cholecalciferol  1,000 Units Oral Daily  . gabapentin  600 mg Oral BID  . insulin aspart  0-20 Units Subcutaneous TID WC  . insulin aspart  0-5 Units Subcutaneous QHS  . multivitamin with minerals  1 tablet Oral Daily  . mupirocin ointment  1 application Nasal BID  . oxybutynin  5 mg Oral BID  . polyethylene glycol  17 g Oral Daily  . vitamin B-12  1,000 mcg Oral Daily    Objective: Vital signs in last 24 hours: Temp:  [98 F (36.7 C)-98.7 F (37.1 C)] 98 F (36.7 C) (11/14 0700) Pulse  Rate:  [72-102] 97 (11/14 1300) Resp:  [15-29] 21 (11/14 1300) BP: (129-169)/(64-109) 148/86 (11/14 1300) SpO2:  [90 %-100 %] 98 % (11/14 1300) FiO2 (%):  [35 %] 35 % (11/13 1930) Constitutional:  lethargic HENT: /AT, PERRLA, no scleral icterus Mouth/Throat: Oropharynx is clear and dry. No oropharyngeal exudate.  Cardiovascular: Normal rate, regular rhythm and normal heart sounds.  Pulmonary/Chest: Effort normal and breath sounds normal. No respiratory distress.  has no wheezes.  Neck = supple, no nuchal rigidity Abdominal: Soft. Bowel sounds are normal.  exhibits no distension. There is no tenderness.  Lymphadenopathy: no cervical adenopathy. No axillary adenopathy Neurological: lethargi Skin: r heel wit shallow ulcer, L leg with 1 cm ulcer with some purulence Psychiatric: unable  Ext grimaces with passive rom of l hip    Lab Results Recent Labs    04/12/17 1014 04/13/17 0323  WBC 27.4* 22.9*  HGB 13.2 12.4  HCT 40.5 38.3  NA 133* 134*  K 3.1* 3.5  CL 98* 97*  CO2 24 26  BUN 23* 25*  CREATININE 1.13* 1.09*    Results for orders placed or performed during the hospital encounter of 04/10/17  Urine culture     Status: Abnormal   Collection Time: 04/10/17  6:40 AM  Result Value Ref Range Status  Specimen Description URINE, RANDOM  Final   Special Requests NONE  Final   Culture >=100,000 COLONIES/mL ESCHERICHIA COLI (A)  Final   Report Status 04/12/2017 FINAL  Final   Organism ID, Bacteria ESCHERICHIA COLI (A)  Final      Susceptibility   Escherichia coli - MIC*    AMPICILLIN <=2 SENSITIVE Sensitive     CEFAZOLIN <=4 SENSITIVE Sensitive     CEFTRIAXONE <=1 SENSITIVE Sensitive     CIPROFLOXACIN <=0.25 SENSITIVE Sensitive     GENTAMICIN <=1 SENSITIVE Sensitive     IMIPENEM <=0.25 SENSITIVE Sensitive     NITROFURANTOIN <=16 SENSITIVE Sensitive     TRIMETH/SULFA <=20 SENSITIVE Sensitive     AMPICILLIN/SULBACTAM <=2 SENSITIVE Sensitive     PIP/TAZO <=4 SENSITIVE  Sensitive     Extended ESBL NEGATIVE Sensitive     * >=100,000 COLONIES/mL ESCHERICHIA COLI  Culture, blood (Routine x 2)     Status: Abnormal   Collection Time: 04/10/17  6:48 AM  Result Value Ref Range Status   Specimen Description BLOOD RIGHT ANTECUBITAL  Final   Special Requests   Final    BOTTLES DRAWN AEROBIC AND ANAEROBIC Blood Culture adequate volume   Culture  Setup Time   Final    GRAM POSITIVE COCCI IN BOTH AEROBIC AND ANAEROBIC BOTTLES CRITICAL RESULT CALLED TO, READ BACK BY AND VERIFIED WITH: JASON ROBBINS AT 2122 04/10/17.PMH    Culture (A)  Final    STAPHYLOCOCCUS AUREUS SUSCEPTIBILITIES PERFORMED ON PREVIOUS CULTURE WITHIN THE LAST 5 DAYS. Performed at [facility], [place] [address], [place], [place] [address]    Report Status 04/13/2017 FINAL  Final  Blood Culture ID Panel (Reflexed)     Status: Abnormal   Collection Time: 04/10/17  6:48 AM  Result Value Ref Range Status   Enterococcus species NOT DETECTED NOT DETECTED Final   Listeria monocytogenes NOT DETECTED NOT DETECTED Final   Staphylococcus species DETECTED (A) NOT DETECTED Final    Comment: CRITICAL RESULT CALLED TO, READ BACK BY AND VERIFIED WITH: JASON ROBBINS AT 2122 04/10/17.PMH    Staphylococcus aureus DETECTED (A) NOT DETECTED Final    Comment: Methicillin (oxacillin)-resistant Staphylococcus aureus (MRSA). MRSA is predictably resistant to beta-lactam antibiotics (except ceftaroline). Preferred therapy is vancomycin unless clinically contraindicated. Patient requires contact precautions if  hospitalized. CRITICAL RESULT CALLED TO, READ BACK BY AND VERIFIED WITH: JASON ROBBINS AT 2122 04/10/17.PMH    Methicillin resistance DETECTED (A) NOT DETECTED Final    Comment: CRITICAL RESULT CALLED TO, READ BACK BY AND VERIFIED WITH: JASON ROBBINS AT 2122 04/10/17.PMH    Streptococcus species NOT DETECTED NOT DETECTED Final   Streptococcus agalactiae NOT DETECTED NOT DETECTED Final   Streptococcus  pneumoniae NOT DETECTED NOT DETECTED Final   Streptococcus pyogenes NOT DETECTED NOT DETECTED Final   Acinetobacter baumannii NOT DETECTED NOT DETECTED Final   Enterobacteriaceae species NOT DETECTED NOT DETECTED Final   Enterobacter cloacae complex NOT DETECTED NOT DETECTED Final   Escherichia coli NOT DETECTED NOT DETECTED Final   Klebsiella oxytoca NOT DETECTED NOT DETECTED Final   Klebsiella pneumoniae NOT DETECTED NOT DETECTED Final   Proteus species NOT DETECTED NOT DETECTED Final   Serratia marcescens NOT DETECTED NOT DETECTED Final   Haemophilus influenzae NOT DETECTED NOT DETECTED Final   Neisseria meningitidis NOT DETECTED NOT DETECTED Final   Pseudomonas aeruginosa NOT DETECTED NOT DETECTED Final   Candida albicans NOT DETECTED NOT DETECTED Final   Candida glabrata NOT DETECTED NOT DETECTED Final  Candida krusei NOT DETECTED NOT DETECTED Final   Candida parapsilosis NOT DETECTED NOT DETECTED Final   Candida tropicalis NOT DETECTED NOT DETECTED Final  Culture, blood (Routine x 2)     Status: Abnormal   Collection Time: 04/10/17  7:38 AM  Result Value Ref Range Status   Specimen Description BLOOD RIGHT ASSIST CONTROL  Final   Special Requests   Final    BOTTLES DRAWN AEROBIC AND ANAEROBIC Blood Culture adequate volume   Culture  Setup Time   Final    GRAM POSITIVE COCCI IN BOTH AEROBIC AND ANAEROBIC BOTTLES CRITICAL RESULT CALLED TO, READ BACK BY AND VERIFIED WITH: JASON ROBBINS AT 2122 04/10/17.PMH    Culture METHICILLIN RESISTANT STAPHYLOCOCCUS AUREUS (A)  Final   Report Status 04/13/2017 FINAL  Final   Organism ID, Bacteria METHICILLIN RESISTANT STAPHYLOCOCCUS AUREUS  Final      Susceptibility   Methicillin resistant staphylococcus aureus - MIC*    CIPROFLOXACIN >=8 RESISTANT Resistant     ERYTHROMYCIN >=8 RESISTANT Resistant     GENTAMICIN <=0.5 SENSITIVE Sensitive     OXACILLIN >=4 RESISTANT Resistant     TETRACYCLINE <=1 SENSITIVE Sensitive     VANCOMYCIN 1  SENSITIVE Sensitive     TRIMETH/SULFA <=10 SENSITIVE Sensitive     CLINDAMYCIN <=0.25 SENSITIVE Sensitive     RIFAMPIN <=0.5 SENSITIVE Sensitive     Inducible Clindamycin NEGATIVE Sensitive     * METHICILLIN RESISTANT STAPHYLOCOCCUS AUREUS  MRSA PCR Screening     Status: Abnormal   Collection Time: 04/11/17  9:40 AM  Result Value Ref Range Status   MRSA by PCR POSITIVE (A) NEGATIVE Final    Comment:        The GeneXpert MRSA Assay (FDA approved for NASAL specimens only), is one component of a comprehensive MRSA colonization surveillance program. It is not intended to diagnose MRSA infection nor to guide or monitor treatment for MRSA infections. RESULT CALLED TO, READ BACK BY AND VERIFIED WITH: BRANDY MANSFIELD @ 1108 04/11/17 [place]   Culture, blood (single) w Reflex to ID Panel     Status: None (Preliminary result)   Collection Time: 04/11/17  9:09 PM  Result Value Ref Range Status   Specimen Description BLOOD LEFT ARM  Final   Special Requests   Final    BOTTLES DRAWN AEROBIC AND ANAEROBIC Blood Culture adequate volume   Culture NO GROWTH 2 DAYS  Final   Report Status PENDING  Incomplete  Aerobic Culture (superficial specimen)     Status: None (Preliminary result)   Collection Time: 04/12/17  2:20 PM  Result Value Ref Range Status   Specimen Description LEG WOUND  Final   Special Requests Immunocompromised  Final   Gram Stain   Final    RARE WBC PRESENT,BOTH PMN AND MONONUCLEAR RARE GRAM POSITIVE COCCI    Culture   Final    TOO YOUNG TO READ Performed at [facility], [place] [address], [place], [place] [address]    Report Status PENDING  Incomplete    Studies/Results: Ct Angio Chest Pe W Or Wo Contrast  Result Date: 04/12/2017 CLINICAL DATA:  Acute onset of shortness of breath. Assess for source of sepsis. EXAM: CT ANGIOGRAPHY CHEST WITH CONTRAST TECHNIQUE: Multidetector CT imaging of the chest was performed using the standard protocol during bolus  administration of intravenous contrast. Multiplanar CT image reconstructions and MIPs were obtained to evaluate the vascular anatomy. CONTRAST:  54m ISOVUE-370 IOPAMIDOL (ISOVUE-370) INJECTION 76% COMPARISON:  Chest radiograph performed  04/11/2017, CT of the abdomen and pelvis performed 04/10/2017, and CTA of the chest performed 09/04/2012 FINDINGS: Cardiovascular:  There is no evidence of pulmonary embolus. Diffuse coronary artery calcifications are seen. The heart is mildly enlarged. Scattered calcification is noted along the aortic arch and descending thoracic aorta. Scattered calcification is noted along the proximal great vessels. Mediastinum/Nodes: Tracheomalacia is noted. The mediastinum is otherwise unremarkable. No mediastinal lymphadenopathy is seen. No pericardial effusion is identified. The visualized portions of the thyroid gland are unremarkable. No axillary lymphadenopathy is appreciated. Lungs/Pleura: Trace right and small left pleural effusions are noted. Underlying right basilar airspace opacity may reflect atelectasis or possibly mild infection. No pneumothorax is seen. No dominant mass is identified. Upper Abdomen: The visualized portions of the liver and spleen are unremarkable. The patient is status post cholecystectomy, with clips noted along the gallbladder fossa. The visualized portions of the pancreas and adrenal glands are within normal limits. Nonspecific perinephric stranding is noted bilaterally. A right renal cyst is noted. Musculoskeletal: There is prominent vague soft tissue density tracking about the intervertebral disc space at T9-T10, with new significant widening of the disc space and mild anterolisthesis of T9 on T10, suspicious for discitis and osteomyelitis. This is largely new from 2014. There is underlying chronic osseous fusion at T7 and T8, and prominent anterior bridging osteophytes along the lower thoracic spine. The visualized musculature is unremarkable in appearance.  Review of the MIP images confirms the above findings. IMPRESSION: 1. No evidence of pulmonary embolus. 2. Prominent vague soft tissue density tracking about the intervertebral disc space at T9-T10, with new significant widening of the disc space since two days ago and mild anterolisthesis of T9 on T10, raising concern for relatively severe acute discitis and osteomyelitis. This is thought to reflect the source of infection, given the patient's sepsis. Underlying abscess cannot be excluded. Would correlate for clinical evidence of encroachment on the spinal canal, and recommend MRI of the thoracic spine with contrast for further evaluation if the patient can tolerate the study. 3. Trace right and small left pleural effusion. Underlying right basilar airspace opacity may reflect atelectasis or possibly mild infection. 4. Diffuse coronary artery calcifications.  Mild cardiomegaly. 5. Tracheomalacia noted. 6. Right renal cyst seen. These results were called by telephone at the time of interpretation on 04/12/2017 at 5:16 am to Nursing at the [facility] ICU, who verbally acknowledged these results. Electronically Signed   By: Garald Balding M.D.   On: 04/12/2017 05:21   Dg Chest Port 1 View  Result Date: 04/12/2017 CLINICAL DATA:  Cardiomegaly respiratory failure . EXAM: PORTABLE CHEST 1 VIEW COMPARISON:  CT 04/12/2017.  Chest x-ray 04/11/2017. FINDINGS: Stable cardiomegaly. Interim improvement of aeration noted bilaterally suggesting improving interstitial edema and/or pneumonitis. Small residual left pleural effusion. No pneumothorax . IMPRESSION: 1.  Stable cardiomegaly. 2. Interim improvement of aeration noted bilaterally suggesting improving interstitial edema and/or pneumonitis. Small residual left pleural effusion . Electronically Signed   By: Marcello Moores  Register   On: 04/12/2017 07:21   Dg Chest Port 1 View  Result Date: 04/11/2017 CLINICAL DATA:  76 year old female with history of acute respiratory  failure. EXAM: PORTABLE CHEST 1 VIEW COMPARISON:  Chest x-ray 04/10/2017. FINDINGS: New patchy airspace consolidation throughout the right lung, most confluent in the right upper lobe and medial aspect of the right lung base, as well as in the left base, concerning for multilobar pneumonia. Relative sparing of the left upper lobe. Possible small bilateral pleural effusions. No evidence of  pulmonary edema. Heart size is mildly enlarged (unchanged). Upper mediastinal contours are within normal limits allowing for slight patient rotation the left. Aortic atherosclerosis. Followup PA and lateral chest X-ray is recommended in 3-4 weeks following trial of antibiotic therapy to ensure resolution and exclude underlying malignancy. Status post left shoulder arthroplasty. IMPRESSION: 1. Findings are compatible with multilobar pneumonia, as above. Possible small bilateral pleural effusions. 2. Aortic atherosclerosis. 3. Mild cardiomegaly is unchanged. Electronically Signed   By: Vinnie Langton M.D.   On: 04/11/2017 19:43   Impressions:  - Normal LVEF. Limited visualization of the right heart and valves.   Study is inadequate for assessment of endocarditis.  Assessment/Plan: CAILEIGH CANCHE is a 76 y.o. female with MRSA bacteremia and hip/back pain. Had L hip injection 11/1 for chronic pain at the site. She has RA and is on plaquenil and MTX as otpt. She does have some skin wounds as likely source. Some concern she has seeded her back or L hip. Also has R TKR and prosthetic L shoulder but no evidence infection at those sites.  She also has a pelvic stimulator in place.  Has E coli in urine but UA with only 0-5 wbc so likely contaminant.  CRP 11, ESR 8  initially but on repeat 28 and 79 CT 11/13 suggests acuteT9-10 discitis/osteomyelitis  Fu bcx 11/12 ngtd TTE neg but poor study.    Recommendations Can place PICC Continue vanco - at this point given findings on CTscan with T9-10 Discitis will need 6 week  course minimum of IV vanco Since will need prolonged IV abx course no need for TEE unless she develops concern for valve rupture or valve ring abscess I have cultured L calf wound and GPC noted. Likely source of the bacteremia.  Thank you very much for the consult. Will follow with you.  Leonel Ramsay   04/13/2017, 3:43 PM

## 2017-04-13 NOTE — Progress Notes (Signed)
Pharmacy Antibiotic Note  Susan Pratt is a 76 y.o. female admitted on 04/10/2017 with sepsis.  Pharmacy has been consulted for vancomycin dosing.  11/13: Patient received IV contrast and SCr 1.13 (0.66)  Plan: Based on two levels, new Ke calculated to be 0.4 with half life of 17 hrs. Will start Vancomycin 1250mg  Q24H and continue to monitor Scr. Vancomycin random will be drawn 11/16 prior to dose.    Ke = 0.4 T1/2 = 17 hrs Vd = 61.8 L  Height: 5\' 10"  (177.8 cm) Weight: 260 lb (117.9 kg) IBW/kg (Calculated) : 68.5  Temp (24hrs), Avg:98.4 F (36.9 C), Min:98 F (36.7 C), Max:98.9 F (37.2 C)  Recent Labs  Lab 04/10/17 0640 04/11/17 0333 04/12/17 1014 04/12/17 1510 04/13/17 0315 04/13/17 0323  WBC 21.0* 24.6* 27.4*  --   --  22.9*  CREATININE 0.85 0.66 1.13*  --   --  1.09*  LATICACIDVEN 1.5  --   --   --   --   --   VANCOTROUGH  --   --   --  28*  --   --   VANCORANDOM  --   --   --   --  17  --     Estimated Creatinine Clearance: 60.3 mL/min (A) (by C-G formula based on SCr of 1.09 mg/dL (H)).    Allergies  Allergen Reactions  . Lisinopril Swelling    Causes tongue swelling    Antimicrobials this admission: Zosyn 11/11 >> 11/12 Vancomycin 11/11 >>   Microbiology results: 11/11 BCx: MRSA 11/11 UCx: E coli (per ID likely contaminant) 11/12 MRSA PCR: Positive  Thank you for allowing pharmacy to be a part of this patient's care.  Lendon Ka, PharmD Pharmacy Resident 04/13/2017 8:26 AM

## 2017-04-13 NOTE — Care Management (Signed)
RNCM called to ICU to check patient's insurance to see if it will be covered under their insurance- husband would not allow it to be placed today. I called husband back and left message telling him that is was medically necessary to treat patient and should be covered under their insurance however there's no other means that would be more appropriate and this is medically necessary to treat her.

## 2017-04-13 NOTE — Progress Notes (Signed)
Spoke with Dr Leonidas Romberg regarding patients chest pain 10 out of 10 shape sensation. Per Dr Leonidas Romberg and his assessment no additional orders given and MD is aware.

## 2017-04-13 NOTE — Progress Notes (Signed)
[facility] [place] NAME: Susan Pratt    MR#:  [id number]  DATE OF BIRTH:  [birth year]  SUBJECTIVE:   Pt. Here due to sepsis due Diskitis.  Blood Culture + for MRSA. Repeat BC on 11/12 are (-) so far.  Will get PICC Line.  Still having significant back pain.   REVIEW OF SYSTEMS:   Review of Systems  Constitutional: Negative for chills and fever.  HENT: Negative for congestion and tinnitus.   Eyes: Negative for blurred vision and double vision.  Respiratory: Negative for cough, shortness of breath and wheezing.   Cardiovascular: Negative for chest pain, orthopnea and PND.  Gastrointestinal: Negative for abdominal pain, diarrhea, nausea and vomiting.  Genitourinary: Negative for dysuria and hematuria.  Musculoskeletal: Positive for back pain.  Neurological: Negative for dizziness, sensory change and focal weakness.  All other systems reviewed and are negative.  DRUG ALLERGIES:   Allergies  Allergen Reactions  . Lisinopril Swelling    Causes tongue swelling    VITALS:  Blood pressure (!) 148/86, pulse 97, temperature 98 F (36.7 C), resp. rate (!) 21, height 5\' 10"  (1.778 m), weight 117.9 kg (260 lb), SpO2 98 %.  PHYSICAL EXAMINATION:   Physical Exam  GENERAL:  76 y.o.-year-old obese patient lying in the bed in some distress due to back pain.  EYES: Pupils equal, round, reactive to light and accommodation. No scleral icterus. Extraocular muscles intact.  HEENT: Head atraumatic, normocephalic. Oropharynx and nasopharynx dry NECK:  Supple, no jugular venous distention. No thyroid enlargement, no tenderness.  LUNGS: Normal breath sounds bilaterally, no wheezing, rales, rhonchi. No use of accessory muscles of respiration.  CARDIOVASCULAR: S1, S2 normal. No murmurs, rubs, or gallops.  ABDOMEN: Soft, nontender, nondistended. Bowel sounds present. No organomegaly or mass.  EXTREMITIES: No cyanosis, clubbing or edema b/l.     NEUROLOGIC: No focal motor or sensory deficits appreciated bilaterally, globally weak. PSYCHIATRIC: AAO X 3.   sKIN: Bilateral heel superficial small skin tears prior to admission  LABORATORY PANEL:  CBC Recent Labs  Lab 04/13/17 0323  WBC 22.9*  HGB 12.4  HCT 38.3  PLT 346    Chemistries  Recent Labs  Lab 04/10/17 0640  04/11/17 1936  04/13/17 0323  NA 133*   < >  --    < > 134*  K 4.6   < >  --    < > 3.5  CL 99*   < >  --    < > 97*  CO2 24   < >  --    < > 26  GLUCOSE 217*   < >  --    < > 154*  BUN 26*   < >  --    < > 25*  CREATININE 0.85   < >  --    < > 1.09*  CALCIUM 9.4   < >  --    < > 8.9  MG  --   --  1.8  --   --   AST 16  --   --   --   --   ALT 29  --   --   --   --   ALKPHOS 67  --   --   --   --   BILITOT 1.6*  --   --   --   --    < > = values in this interval not displayed.   Cardiac Enzymes  Recent Labs  Lab 04/10/17 0640  TROPONINI <0.03   RADIOLOGY:  Ct Angio Chest Pe W Or Wo Contrast  Result Date: 04/12/2017 CLINICAL DATA:  Acute onset of shortness of breath. Assess for source of sepsis. EXAM: CT ANGIOGRAPHY CHEST WITH CONTRAST TECHNIQUE: Multidetector CT imaging of the chest was performed using the standard protocol during bolus administration of intravenous contrast. Multiplanar CT image reconstructions and MIPs were obtained to evaluate the vascular anatomy. CONTRAST:  46mL ISOVUE-370 IOPAMIDOL (ISOVUE-370) INJECTION 76% COMPARISON:  Chest radiograph performed 04/11/2017, CT of the abdomen and pelvis performed 04/10/2017, and CTA of the chest performed 09/04/2012 FINDINGS: Cardiovascular:  There is no evidence of pulmonary embolus. Diffuse coronary artery calcifications are seen. The heart is mildly enlarged. Scattered calcification is noted along the aortic arch and descending thoracic aorta. Scattered calcification is noted along the proximal great vessels. Mediastinum/Nodes: Tracheomalacia is noted. The mediastinum is otherwise  unremarkable. No mediastinal lymphadenopathy is seen. No pericardial effusion is identified. The visualized portions of the thyroid gland are unremarkable. No axillary lymphadenopathy is appreciated. Lungs/Pleura: Trace right and small left pleural effusions are noted. Underlying right basilar airspace opacity may reflect atelectasis or possibly mild infection. No pneumothorax is seen. No dominant mass is identified. Upper Abdomen: The visualized portions of the liver and spleen are unremarkable. The patient is status post cholecystectomy, with clips noted along the gallbladder fossa. The visualized portions of the pancreas and adrenal glands are within normal limits. Nonspecific perinephric stranding is noted bilaterally. A right renal cyst is noted. Musculoskeletal: There is prominent vague soft tissue density tracking about the intervertebral disc space at T9-T10, with new significant widening of the disc space and mild anterolisthesis of T9 on T10, suspicious for discitis and osteomyelitis. This is largely new from 2014. There is underlying chronic osseous fusion at T7 and T8, and prominent anterior bridging osteophytes along the lower thoracic spine. The visualized musculature is unremarkable in appearance. Review of the MIP images confirms the above findings. IMPRESSION: 1. No evidence of pulmonary embolus. 2. Prominent vague soft tissue density tracking about the intervertebral disc space at T9-T10, with new significant widening of the disc space since two days ago and mild anterolisthesis of T9 on T10, raising concern for relatively severe acute discitis and osteomyelitis. This is thought to reflect the source of infection, given the patient's sepsis. Underlying abscess cannot be excluded. Would correlate for clinical evidence of encroachment on the spinal canal, and recommend MRI of the thoracic spine with contrast for further evaluation if the patient can tolerate the study. 3. Trace right and small left  pleural effusion. Underlying right basilar airspace opacity may reflect atelectasis or possibly mild infection. 4. Diffuse coronary artery calcifications.  Mild cardiomegaly. 5. Tracheomalacia noted. 6. Right renal cyst seen. These results were called by telephone at the time of interpretation on 04/12/2017 at 5:16 am to Nursing at the [facility] ICU, who verbally acknowledged these results. Electronically Signed   By: Garald Balding M.D.   On: 04/12/2017 05:21   Dg Chest Port 1 View  Result Date: 04/12/2017 CLINICAL DATA:  Cardiomegaly respiratory failure . EXAM: PORTABLE CHEST 1 VIEW COMPARISON:  CT 04/12/2017.  Chest x-ray 04/11/2017. FINDINGS: Stable cardiomegaly. Interim improvement of aeration noted bilaterally suggesting improving interstitial edema and/or pneumonitis. Small residual left pleural effusion. No pneumothorax . IMPRESSION: 1.  Stable cardiomegaly. 2. Interim improvement of aeration noted bilaterally suggesting improving interstitial edema and/or pneumonitis. Small residual left pleural effusion . Electronically Signed   By:  [place]   On: 04/12/2017 07:21   Dg Chest Port 1 View  Result Date: 04/11/2017 CLINICAL DATA:  76 year old female with history of acute respiratory failure. EXAM: PORTABLE CHEST 1 VIEW COMPARISON:  Chest x-ray 04/10/2017. FINDINGS: New patchy airspace consolidation throughout the right lung, most confluent in the right upper lobe and medial aspect of the right lung base, as well as in the left base, concerning for multilobar pneumonia. Relative sparing of the left upper lobe. Possible small bilateral pleural effusions. No evidence of pulmonary edema. Heart size is mildly enlarged (unchanged). Upper mediastinal contours are within normal limits allowing for slight patient rotation the left. Aortic atherosclerosis. Followup PA and lateral chest X-ray is recommended in 3-4 weeks following trial of antibiotic therapy to ensure resolution and exclude  underlying malignancy. Status post left shoulder arthroplasty. IMPRESSION: 1. Findings are compatible with multilobar pneumonia, as above. Possible small bilateral pleural effusions. 2. Aortic atherosclerosis. 3. Mild cardiomegaly is unchanged. Electronically Signed   By: Vinnie Langton M.D.   On: 04/11/2017 19:43   ASSESSMENT AND PLAN:   76 year old female with past medical history of arthritis, diabetes, hypertension comes to the emergency room with altered mental status and fever.  She has been complaining of chronic back pain and left hip pain. She had a steroid injection a left hip last Thursday as an outpatient.  1. MRSA bacteremia-this is the source of patient's sepsis. 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 Cannot do MRI of the thoracic/lumbar sinus patient has a spinal stimulator. -Appreciate infectious disease input and patient will likely need long-term IV antibiotics. Continue IV vancomycin for at least 6 weeks. Issues repeat blood cultures from November 12 are negative so far. We'll place an order for PICC line. - Patient's TTE is negative for endocarditis. No plans for doing TEE for now. -The source is likely the patient's left lower extremity wound which has been cultured by infectious disease and will follow up on cultures.  2. Metabolic encephalopathy. Likely secondary to the sepsis. - improved today and will cont. To monitor.   3. Back pain-this is secondary to patient's severe discitis which is a source of patient's sepsis. Patient having significant back pain despite getting IV Dilaudid and oral tramadol. -I will get palliative care consult to help manage her pain.  4. Left hip bursitis. As noted above she had a steroid injection on Thursday. There is some bruising around the site. CT scan of the abdomen got some cuts down that area and do not see any obvious abscess.  pt was seen by orthopedic and no evidence of Hip infection.     5. Chronic pain-  continue oxycodone, IV Dilaudid. - Palliative Care consult to help with Pain management.   6. Diabetes - continue sliding scale insulin. Blood sugar stable.  7. Hypokalemia- improved w/ supplementation.   Case discussed with Care Management/Social Worker.  CODE STATUS: Full  DVT Prophylaxis: Lovenox  TOTAL CRITICAL TIME TAKING CARE OF THIS PATIENT: 30 minutes.    Note: This dictation was prepared with Dragon dictation along with smaller phrase technology. Any transcriptional errors that result from this process are unintentional.  Henreitta Leber M.D on 04/13/2017 at 2:40 PM  Between 7am to 6pm - Pager - [phone]  After 6pm go to [email or web address] - password [email or web address] [facility]  Office  [phone]  CC: Primary care physician; Adline Potter, MD

## 2017-04-13 NOTE — Progress Notes (Signed)
PT Cancellation Note  Patient Details Name: Susan Pratt MRN: [id number] DOB: 1940/10/28   Cancelled Treatment:    Reason Eval/Treat Not Completed: Pain limiting ability to participate(Consult received and chart reviewed. Patient currently declining participation with therapy evaluation due to pain.  Offered repositioning, act assist ROM; unable to encourage participation.  Will continue efforts next date as appropriate.)   [name] H. Owens Shark, PT, DPT, NCS 04/13/17, 2:25 PM [phone]

## 2017-04-13 NOTE — Progress Notes (Signed)
Spoke with Dr Leonidas Romberg. Patient states that she has 10/10 sharp pain all over her chest. Orders to get an ekg and give dilaudid.

## 2017-04-14 LAB — GLUCOSE, CAPILLARY
GLUCOSE-CAPILLARY: 175 mg/dL — AB (ref 65–99)
GLUCOSE-CAPILLARY: 148 mg/dL — AB (ref 65–99)
Glucose-Capillary: 162 mg/dL — ABNORMAL HIGH (ref 65–99)
Glucose-Capillary: 167 mg/dL — ABNORMAL HIGH (ref 65–99)
Glucose-Capillary: 150 mg/dL — ABNORMAL HIGH (ref 65–99)

## 2017-04-14 LAB — CREATININE, SERUM
CREATININE: 0.7 mg/dL (ref 0.44–1.00)
GFR calc non Af Amer: >60 mL/min (ref >60)

## 2017-04-14 MED ORDER — ENOXAPARIN SODIUM 40 MG/0.4ML ~~LOC~~ SOLN
40.0000 mg | SUBCUTANEOUS | Status: DC
  Administered 2017-04-14 – 2017-04-18 (×5): 40 mg via SUBCUTANEOUS
  Filled 2017-04-14 (×5): qty 0.4

## 2017-04-14 MED ORDER — SODIUM CHLORIDE 0.9% FLUSH
10.0000 mL | Freq: Two times a day (BID) | INTRAVENOUS | Status: DC
  Administered 2017-04-14 – 2017-04-18 (×8): 10 mL

## 2017-04-14 MED ORDER — SODIUM CHLORIDE 0.9% FLUSH: 10.0000 mL | INTRAVENOUS | Status: DC | PRN

## 2017-04-14 NOTE — Progress Notes (Addendum)
Consult placed for pain management. Unfortunately, the palliative medicine team is unable to provide guidance for chronic pain. It may be helpful to speak with her chronic pain provider or the anesthesia team. Thank you.   Asencion Gowda, NP 04/14/2017 2:59 PM Office: [phone] 7am-7pm  Pager: [phone] Call primary team after hours   Contents of this note was discussed with Dr. Vianne Bulls after discussion with Dr. Rowe Pavy.  No charge.

## 2017-04-14 NOTE — Progress Notes (Signed)
[facility] [place] NAME: Susan Pratt    MR#:  [id number]  DATE OF BIRTH:  1941-03-27  SUBJECTIVE:   Pt. Here due to sepsis due Diskitis.  Blood Culture + for MRSA. Repeat BC on 11/12 are (-) so far.  Will get PICC Line.  Still having significant back pain.  She does have some cough.  REVIEW OF SYSTEMS:   Review of Systems  Constitutional: Negative for chills and fever.  HENT: Negative for congestion and tinnitus.   Eyes: Negative for blurred vision and double vision.  Respiratory: Negative for cough, shortness of breath and wheezing.   Cardiovascular: Negative for chest pain, orthopnea and PND.  Gastrointestinal: Negative for abdominal pain, diarrhea, nausea and vomiting.  Genitourinary: Negative for dysuria and hematuria.  Musculoskeletal: Positive for back pain.  Neurological: Negative for dizziness, sensory change and focal weakness.  All other systems reviewed and are negative.  DRUG ALLERGIES:   Allergies  Allergen Reactions  . Lisinopril Swelling    Causes tongue swelling    VITALS:  Blood pressure 130/70, pulse (!) 101, temperature 98.7 F (37.1 C), resp. rate 16, height 5\' 10"  (1.778 m), weight 117.9 kg (260 lb), SpO2 96 %.  PHYSICAL EXAMINATION:   Physical Exam  GENERAL:  76 y.o.-year-old obese patient lying in the bed in some distress due to back pain.  EYES: Pupils equal, round, reactive to light and accommodation. No scleral icterus. Extraocular muscles intact.  HEENT: Head atraumatic, normocephalic. Oropharynx and nasopharynx dry NECK:  Supple, no jugular venous distention. No thyroid enlargement, no tenderness.  LUNGS: Normal breath sounds bilaterally, no wheezing, rales, rhonchi. No use of accessory muscles of respiration.  CARDIOVASCULAR: S1, S2 normal. No murmurs, rubs, or gallops.  ABDOMEN: Soft, nontender, nondistended. Bowel sounds present. No organomegaly or mass.  EXTREMITIES: No cyanosis,  clubbing or edema b/l.    NEUROLOGIC: No focal motor or sensory deficits appreciated bilaterally, globally weak. PSYCHIATRIC: AAO X 3.   sKIN: Bilateral heel superficial small skin tears prior to admission  LABORATORY PANEL:  CBC Recent Labs  Lab 04/13/17 0323  WBC 22.9*  HGB 12.4  HCT 38.3  PLT 346    Chemistries  Recent Labs  Lab 04/10/17 0640  04/11/17 1936  04/13/17 0323 04/14/17 0742  NA 133*   < >  --    < > 134*  --   K 4.6   < >  --    < > 3.5  --   CL 99*   < >  --    < > 97*  --   CO2 24   < >  --    < > 26  --   GLUCOSE 217*   < >  --    < > 154*  --   BUN 26*   < >  --    < > 25*  --   CREATININE 0.85   < >  --    < > 1.09* 0.70  CALCIUM 9.4   < >  --    < > 8.9  --   MG  --   --  1.8  --   --   --   AST 16  --   --   --   --   --   ALT 29  --   --   --   --   --   ALKPHOS 67  --   --   --   --   --  BILITOT 1.6*  --   --   --   --   --    < > = values in this interval not displayed.   Cardiac Enzymes Recent Labs  Lab 04/10/17 0640  TROPONINI <0.03   RADIOLOGY:  No results found. ASSESSMENT AND PLAN:   76 year old female with past medical history of arthritis, diabetes, hypertension comes to the emergency room with altered mental status and fever.  She has been complaining of chronic back pain and left hip pain. She had a steroid injection a left hip last Thursday as an outpatient.  1. MRSA bacteremia-this is the source of patient's sepsis. 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 Cannot do MRI of the thoracic/lumbar sinus patient has a spinal stimulator. -Appreciate infectious disease input and patient will likely need long-term IV antibiotics. Continue IV vancomycin for at least 6 weeks. Issues repeat blood cultures from November 12 are negative so far.PICC Consult ordered.  The patient is a floor.  - Patient's TTE is negative for endocarditis. No plans for doing TEE for now. -The source is likely the  patient's left lower extremity wound which has been cultured by infectious disease and will follow up on cultures.  2. Metabolic encephalopathy. Likely secondary to the sepsis. - improved today and will cont. To monitor.   3. Back pain-this is secondary to patient's severe discitis which is a source of patient's sepsis. Patient having significant back pain despite getting IV Dilaudid and oral tramadol. -Palliative care consult is placed to manage her pain.. , 4. Left hip bursitis. As noted above she had a steroid injection on Thursday. There is some bruising around the site. CT scan of the abdomen got some cuts down that area and do not see any obvious abscess.  pt was seen by orthopedic and no evidence of Hip infection.    5. Chronic pain-  continue oxycodone, IV Dilaudid. - Palliative Care consult to help with Pain management.   6. Diabetes - continue sliding scale insulin. Blood sugar stable.  7. Hypokalemia- improved w/ supplementation.  #8 PICC line placement today, likely discharge tomorrow. Case discussed with Care Management/Social Worker.  CODE STATUS: Full  DVT Prophylaxis: Lovenox  TOTAL CRITICAL TIME TAKING CARE OF THIS PATIENT: 30 minutes.    Note: This dictation was prepared with Dragon dictation along with smaller phrase technology. Any transcriptional errors that result from this process are unintentional.  Epifanio Lesches M.D on 04/14/2017 at 2:28 PM  Between 7am to 6pm - Pager - [phone]  After 6pm go to [email or web address] - password [email or web address] [facility]  Office  [phone]  CC: Primary care physician; Adline Potter, MD

## 2017-04-14 NOTE — Progress Notes (Signed)
Patient alert, disoriented to time and situation. On 2 liters [place], will be on cardiac monitor. Poor to fair appetite. Foley intact with adequate urine output. Pending PICC line placement. Awaiting for pallative-goals and care treatment. Report called to [place].

## 2017-04-14 NOTE — Progress Notes (Signed)
PT Cancellation Note  Patient Details Name: TIFFANEY HEIMANN MRN: [id number] DOB: November 10, 1940   Cancelled Treatment:    Reason Eval/Treat Not Completed: Patient at procedure or test/unavailable(Evaluation re-attempted.  Patient currently meeting with palliative care team. Will continue efforts at later time/date as medically appropriate.)   Teigen Parslow H. Owens Shark, PT, DPT, NCS 04/14/17, 2:14 PM [phone]

## 2017-04-14 NOTE — Progress Notes (Signed)
Pharmacy Antibiotic Note  Susan Pratt is a 76 y.o. female admitted on 04/10/2017 with MRSA bacteremia.  Pharmacy has been consulted for vancomycin dosing.  11/13: Patient received IV contrast and SCr 1.13 (0.66) 11/15: Scr has improved from 1.13 > 1.09 > 0.7  Plan: Will continue Vancomycin 1250mg  Q24H. Will continue to monitor Scr. Vancomycin random ordered for 11/16 @ 0830.    Ke = 0.4 T1/2 = 17 hrs Vd = 61.8 L  Height: 5\' 10"  (177.8 cm) Weight: 260 lb (117.9 kg) IBW/kg (Calculated) : 68.5  Temp (24hrs), Avg:98.6 F (37 C), Min:98.2 F (36.8 C), Max:98.9 F (37.2 C)  Recent Labs  Lab 04/10/17 0640 04/11/17 0333 04/12/17 1014 04/12/17 1510 04/13/17 0315 04/13/17 0323 04/14/17 0742  WBC 21.0* 24.6* 27.4*  --   --  22.9*  --   CREATININE 0.85 0.66 1.13*  --   --  1.09* 0.70  LATICACIDVEN 1.5  --   --   --   --   --   --   VANCOTROUGH  --   --   --  28*  --   --   --   VANCORANDOM  --   --   --   --  17  --   --     Estimated Creatinine Clearance: 82.1 mL/min (by C-G formula based on SCr of 0.7 mg/dL).    Allergies  Allergen Reactions  . Lisinopril Swelling    Causes tongue swelling    Antimicrobials this admission: Zosyn 11/11 >> 11/12 Vancomycin 11/11 >>   Microbiology results: 11/11 BCx: MRSA 11/11 UCx: E coli (per ID likely contaminant) 11/12 MRSA PCR: Positive  Thank you for allowing pharmacy to be a part of this patient's care.  Lendon Ka, PharmD Pharmacy Resident 04/14/2017 11:08 AM

## 2017-04-14 NOTE — Progress Notes (Signed)
Spoke with Husband Mr. Gwinner he is in agreement with patients PICC line placement.

## 2017-04-14 NOTE — Progress Notes (Signed)
Peripherally Inserted Central Catheter/Midline Placement  The IV Nurse has discussed with the patient and/or persons authorized to consent for the patient, the purpose of this procedure and the potential benefits and risks involved with this procedure.  The benefits include less needle sticks, lab draws from the catheter, and the patient may be discharged home with the catheter. Risks include, but not limited to, infection, bleeding, blood clot (thrombus formation), and puncture of an artery; nerve damage and irregular heartbeat and possibility to perform a PICC exchange if needed/ordered by physician.  Alternatives to this procedure were also discussed.  Bard Power PICC patient education guide, fact sheet on infection prevention and patient information card has been provided to patient /or left at bedside.    PICC/Midline Placement Documentation  PICC Single Lumen 04/14/17 PICC Right Cephalic 42 cm 0 cm (Active)  Indication for Insertion or Continuance of Line Home intravenous therapies (PICC only) 04/14/2017  8:26 PM  Exposed Catheter (cm) 0 cm 04/14/2017  8:26 PM  Site Assessment Clean;Dry;Intact 04/14/2017  8:26 PM  Line Status Flushed;Saline locked;Blood return noted 04/14/2017  8:26 PM  Dressing Type Transparent;Securing device 04/14/2017  8:26 PM  Dressing Status Clean;Dry;Intact;Antimicrobial disc in place 04/14/2017  8:26 PM  Dressing Change Due 04/21/17 04/14/2017  8:26 PM   Placed by Carolee Rota RN.    Susan Pratt 04/14/2017, 8:27 PM

## 2017-04-14 NOTE — Progress Notes (Signed)
No new events or major changes Remains comfortable on  O2 Continues to complain of severe thoracic pain No overt distress  Vitals:   04/14/17 1100 04/14/17 1200 04/14/17 1300 04/14/17 1400  BP: (!) 142/79 136/84 (!) 150/90 130/70  Pulse: (!) 104 (!) 112 (!) 101   Resp: 19 19 19 16   Temp:      TempSrc:      SpO2: 96% 91% 96%   Weight:      Height:        NAD HEENT WNL No JVD noted No wheezes Regular, no M Obese, NABS Extremities warm, chronic stasis changes No focal neurologic deficits  BMP Latest Ref Rng & Units 04/14/2017 04/13/2017 04/12/2017  Glucose 65 - 99 mg/dL - 154(H) 174(H)  BUN 6 - 20 mg/dL - 25(H) 23(H)  Creatinine 0.44 - 1.00 mg/dL 0.70 1.09(H) 1.13(H)  BUN/Creat Ratio 11 - 26 - - -  Sodium 135 - 145 mmol/L - 134(L) 133(L)  Potassium 3.5 - 5.1 mmol/L - 3.5 3.1(L)  Chloride 101 - 111 mmol/L - 97(L) 98(L)  CO2 22 - 32 mmol/L - 26 24  Calcium 8.9 - 10.3 mg/dL - 8.9 8.4(L)    CBC Latest Ref Rng & Units 04/13/2017 04/12/2017 04/11/2017  WBC 3.6 - 11.0 K/uL 22.9(H) 27.4(H) 24.6(H)  Hemoglobin 12.0 - 16.0 g/dL 12.4 13.2 11.8(L)  Hematocrit 35.0 - 47.0 % 38.3 40.5 36.1  Platelets 150 - 440 K/uL 346 275 342    CXR: No new film   IMPRESSION: MRSA bacteremia Severe sepsis  Acute encephalopathy - resolved Thoracic spine discitis and osteomyelitis Severe thoracic pain Pulmonary edema after aggressive volume resuscitation -resolved  PLAN/REC: Transfer to [place] ordered yesterday.  Still awaiting bed Continue vancomycin ID service following PICC to be placed Continue PRN Dilaudid, oxycodone and tramadol   After transfer, PCCM will sign off. Please call if we can be of further assistance  Merton Border, MD PCCM service Mobile [phone] Pager [phone] 04/14/2017 2:58 PM

## 2017-04-15 LAB — CBC
HCT: 37.4 % (ref 35.0–47.0)
Hemoglobin: 12.4 g/dL (ref 12.0–16.0)
MCH: 30.6 pg (ref 26.0–34.0)
MCHC: 33.1 g/dL (ref 32.0–36.0)
MCV: 92.5 fL (ref 80.0–100.0)
PLATELETS: 373 10*3/uL (ref 150–440)
RBC: 4.04 MIL/uL (ref 3.80–5.20)
RDW: 15.4 % — AB (ref 11.5–14.5)
WBC: 19.8 10*3/uL — ABNORMAL HIGH (ref 3.6–11.0)

## 2017-04-15 LAB — CREATININE, SERUM: CREATININE: 0.7 mg/dL (ref 0.44–1.00)

## 2017-04-15 LAB — GLUCOSE, CAPILLARY
GLUCOSE-CAPILLARY: 163 mg/dL — AB (ref 65–99)
GLUCOSE-CAPILLARY: 208 mg/dL — AB (ref 65–99)
Glucose-Capillary: 130 mg/dL — ABNORMAL HIGH (ref 65–99)
Glucose-Capillary: 147 mg/dL — ABNORMAL HIGH (ref 65–99)

## 2017-04-15 LAB — VANCOMYCIN, RANDOM: VANCOMYCIN RM: 9

## 2017-04-15 MED ORDER — VANCOMYCIN HCL 10 G IV SOLR
1250.0000 mg | INTRAVENOUS | Status: DC
  Administered 2017-04-16 – 2017-04-18 (×4): 1250 mg via INTRAVENOUS
  Filled 2017-04-15 (×5): qty 1250

## 2017-04-15 MED ORDER — COLLAGENASE 250 UNIT/GM EX OINT
TOPICAL_OINTMENT | Freq: Every day | CUTANEOUS | Status: DC
  Administered 2017-04-15 – 2017-04-18 (×4): via TOPICAL
  Filled 2017-04-15: qty 30

## 2017-04-15 MED ORDER — SODIUM CHLORIDE 0.9 % IV SOLN: 1500.0000 mg | INTRAVENOUS | Status: DC

## 2017-04-15 MED ORDER — VITAMIN C 500 MG PO TABS
500.0000 mg | ORAL_TABLET | Freq: Every day | ORAL | Status: DC
  Administered 2017-04-15 – 2017-04-18 (×4): 500 mg via ORAL
  Filled 2017-04-15 (×4): qty 1

## 2017-04-15 MED ORDER — ORAL CARE MOUTH RINSE
15.0000 mL | Freq: Two times a day (BID) | OROMUCOSAL | Status: DC
  Administered 2017-04-15 – 2017-04-18 (×5): 15 mL via OROMUCOSAL

## 2017-04-15 MED ORDER — OCUVITE-LUTEIN PO TABS
1.0000 | ORAL_TABLET | Freq: Every day | ORAL | Status: DC
  Filled 2017-04-15: qty 1

## 2017-04-15 MED ORDER — ADULT MULTIVITAMIN W/MINERALS CH
1.0000 | ORAL_TABLET | Freq: Every day | ORAL | Status: DC
  Administered 2017-04-16 – 2017-04-18 (×3): 1 via ORAL
  Filled 2017-04-15 (×3): qty 1

## 2017-04-15 MED ORDER — PREMIER PROTEIN SHAKE
11.0000 [oz_av] | Freq: Two times a day (BID) | ORAL | Status: DC
  Administered 2017-04-15 – 2017-04-18 (×6): 11 [oz_av] via ORAL

## 2017-04-15 MED ORDER — GUAIFENESIN 100 MG/5ML PO SOLN
400.0000 mg | ORAL | Status: DC | PRN
  Administered 2017-04-15 (×2): 400 mg via ORAL
  Filled 2017-04-15 (×3): qty 20

## 2017-04-15 MED ORDER — OCUVITE-LUTEIN PO CAPS
1.0000 | ORAL_CAPSULE | Freq: Every day | ORAL | Status: DC
  Administered 2017-04-15 – 2017-04-18 (×4): 1 via ORAL
  Filled 2017-04-15 (×4): qty 1

## 2017-04-15 MED ORDER — FENTANYL 12 MCG/HR TD PT72
12.5000 ug | MEDICATED_PATCH | TRANSDERMAL | Status: DC
  Administered 2017-04-15 – 2017-04-18 (×2): 12.5 ug via TRANSDERMAL
  Filled 2017-04-15 (×2): qty 1

## 2017-04-15 NOTE — Care Management Important Message (Signed)
Important Message  Patient Details  Name: Susan Pratt MRN: [id number] Date of Birth: November 05, 1940   Medicare Important Message Given:  Yes    Beverly Sessions, RN 04/15/2017, 2:52 PM

## 2017-04-15 NOTE — Progress Notes (Signed)
Initial Nutrition Assessment  DOCUMENTATION CODES:   Obesity unspecified  INTERVENTION:   MVI daily  Ocuvite PO daily for wound healing (provides 40mg  zinc, 200mg  vitamin C, 60IU vitamin E, 2mg  copper)  Recommend Omega 3 fatty acid daily   Vitamin C 500mg  daily for 10 days  Premier Protein BID, each supplement provides 160 kcal and 30 grams of protein.   NUTRITION DIAGNOSIS:   Increased nutrient needs related to wound healing as evidenced by increased estimated needs from protein.  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 Pt documented to be eating 25% of meals. Per chart, pt is weight stable. RD will order Premier Protein and MVIs to encourage wound healing. Pt with with low phosphorus 11/12; will recheck Mg and P with morning labs. RD will obtain nutrition related history and nutrition focused physical exam at follow-up.    Medications reviewed and include: aspirin, vitamin D, lovenox, fentanyl, insulin, miralax, B12, tramadol   Labs reviewed: Na 134(L), Cl 97(L), BUN 25(H), creat 1.09(H)- 11/14 P 2.2(L), Mg 1.8 wnl- 11/12 CRP 28.4(H) Wbc- 19.8(H) cbgs- 160, 174, 154 x 24hrs  Unable to complete Nutrition-Focused physical exam at this time.   Diet Order:  Diet Carb Modified Fluid consistency: Thin; Room service appropriate? Yes  EDUCATION NEEDS:   Not appropriate for education at this time  Skin: Skin Integrity Issues:(Stage II pressure injuries on sacrum, leg, ankle, and heel)  Last BM:  11/14- constipation   Height:   Ht Readings from Last 1 Encounters:   04/10/17 5\' 10"  (1.778 m)    Weight:   Wt Readings from Last 1 Encounters:  04/10/17 260 lb (117.9 kg)    Ideal Body Weight:  68.1 kg  BMI:  Body mass index is 37.31 kg/m.  Estimated Nutritional Needs:   Kcal:  1900-2200kcal/day   Protein:  118-130g/day   Fluid:  >2L/day   Koleen Distance MS, RD, LDN Pager #[phone] After Hours Pager: [phone]

## 2017-04-15 NOTE — Progress Notes (Signed)
[facility] [place] NAME: Susan Pratt    MR#:  [id number]  DATE OF BIRTH:  1940-09-02  SUBJECTIVE:   Pt. Here due to sepsis due Diskitis.  Blood Culture + for MRSA. Repeat BC on 11/12 are (-) so far.  Received PICC line yesterday, still has a little bit cough but main complaint is pain in the mid back area.  She is willing to try fentanyl patch.  No fever.  REVIEW OF SYSTEMS:   Review of Systems  Constitutional: Negative for chills and fever.  HENT: Negative for congestion and tinnitus.   Eyes: Negative for blurred vision and double vision.  Respiratory: Positive for cough. Negative for shortness of breath and wheezing.   Cardiovascular: Negative for chest pain, orthopnea and PND.  Gastrointestinal: Negative for abdominal pain, diarrhea, nausea and vomiting.  Genitourinary: Negative for dysuria and hematuria.  Musculoskeletal: Positive for back pain.  Neurological: Negative for dizziness, sensory change and focal weakness.  All other systems reviewed and are negative.  DRUG ALLERGIES:   Allergies  Allergen Reactions  . Lisinopril Swelling    Causes tongue swelling    VITALS:  Blood pressure (!) 161/91, pulse (!) 109, temperature 98.7 F (37.1 C), temperature source Oral, resp. rate 16, height 5\' 10"  (1.778 m), weight 117.9 kg (260 lb), SpO2 94 %.  PHYSICAL EXAMINATION:   Physical Exam  GENERAL:  76 y.o.-year-old obese patient lying in the bed in some distress due to back pain.  EYES: Pupils equal, round, reactive to light and accommodation. No scleral icterus. Extraocular muscles intact.  HEENT: Head atraumatic, normocephalic. Oropharynx and nasopharynx dry NECK:  Supple, no jugular venous distention. No thyroid enlargement, no tenderness.  LUNGS: Normal breath sounds bilaterally, no wheezing, rales, rhonchi. No use of accessory muscles of respiration.  CARDIOVASCULAR: S1, S2 normal. No murmurs, rubs, or gallops.  ABDOMEN: Soft, nontender, nondistended. Bowel sounds present. No organomegaly or mass.  EXTREMITIES: No cyanosis, clubbing or edema b/l.    NEUROLOGIC: No focal motor or sensory deficits appreciated bilaterally, globally weak. PSYCHIATRIC: AAO X 3.   sKIN: Bilateral heel superficial small skin tears prior to admission  LABORATORY PANEL:  CBC Recent Labs  Lab 04/15/17 0440  WBC 19.8*  HGB 12.4  HCT 37.4  PLT 373    Chemistries  Recent Labs  Lab 04/10/17 0640  04/11/17 1936  04/13/17 0323  04/15/17 0440  NA 133*   < >  --    < > 134*  --   --   K 4.6   < >  --    < > 3.5  --   --   CL 99*   < >  --    < > 97*  --   --   CO2 24   < >  --    < > 26  --   --   GLUCOSE 217*   < >  --    < > 154*  --   --   BUN 26*   < >  --    < > 25*  --   --   CREATININE 0.85   < >  --    < > 1.09*   < > 0.70  CALCIUM 9.4   < >  --    < > 8.9  --   --   MG  --   --  1.8  --   --   --   --  AST 16  --   --   --   --   --   --   ALT 29  --   --   --   --   --   --   ALKPHOS 67  --   --   --   --   --   --   BILITOT 1.6*  --   --   --   --   --   --    < > = values in this interval not displayed.   Cardiac Enzymes Recent Labs  Lab 04/10/17 0640  TROPONINI <0.03   RADIOLOGY:  No results found. ASSESSMENT AND PLAN:   76 year old female with past medical history of arthritis, diabetes, hypertension comes to the emergency room with altered mental status and fever.  She has been complaining of chronic back pain and left hip pain. She had a steroid injection a left hip last Thursday as an outpatient.  1. MRSA bacteremia-this is the source of patient's sepsis. 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 Cannot do MRI of the thoracic/lumbar sinus patient has a spinal stimulator. -Appreciate infectious disease input and patient will likely need long-term IV antibiotics. Continue IV vancomycin for at least 6 weeks.  repeat blood cultures from November 12  are negative so far.PICC line yesterday, continue IV vancomycin, - Patient's TTE is negative for endocarditis. No plans for doing TEE for now. -The source is likely the patient's left lower extremity wound which has been cultured by infectious disease and will follow up on cultures.   2. Metabolic encephalopathy. Likely secondary to the sepsis. - improved 3. Back pain-this is secondary to patient's severe discitis which is a source of patient's sepsis. Patient having significant back pain despite getting IV Dilaudid and oral tramadol. -Talk with palliative care team yesterday, they cannot help with chronic pain management issues.  Added fentanyl patch after discussing with her started on low-dose fentanyl patch , plan to discontinue IV Dilaudid. , 4. Left hip bursitis. As noted above she had a steroid injection on Thursday. There is some bruising around the site. CT scan of the abdomen got some cuts down that area and do not see any obvious abscess.  pt was seen by orthopedic and no evidence of Hip infection.    5. Chronic pain-  continue oxycodone, IV Dilaudid. - Palliative Care consult to help with Pain management.   6. Diabetes - continue sliding scale insulin. Blood sugar stable.  7. Hypokalemia- improved w/ supplementation.  #8 ,  Start fentanyl patch, discontinue IV Dilaudid and see if it is working and then discharge tomorrow. Case discussed with Care Management/Social Worker.  CODE STATUS: Full  DVT Prophylaxis: Lovenox  TOTAL CRITICAL TIME TAKING CARE OF THIS PATIENT: 30 minutes.    Note: This dictation was prepared with Dragon dictation along with smaller phrase technology. Any transcriptional errors that result from this process are unintentional.  Epifanio Lesches M.D on 04/15/2017 at 10:04 AM  Between 7am to 6pm - Pager - [phone]  After 6pm go to [email or web address] - password [email or web address] [facility]  Office  [phone]  CC: Primary care  physician; Adline Potter, MD

## 2017-04-15 NOTE — Consult Note (Addendum)
[place] Nurse wound follow up Wound type: Recheck wounds and edema to lower legs Sacrum.left medial leg/left malleolus and right heel Measurement: Sacrum 2 cm x 2 cm thin layer of slough to wound bed.  Left lateral leg:  1 cm x 1.5 cm wound bed is 100% adherent slough Left lateral malleolus:  1 cm x 1 cm adherent slough to wound bed.  Right heel:  2 cm x 2 cm intact boggy tissue.  Heels are not floating with pillows when I arrive.  Patient is complaining of intense back pain. Encouraged to float heels with a pillow under the calf.  Wound bed: slough, devitalized tissue.  Drainage (amount, consistency, odor) Minimal purulent effluent, byproduct of slough Periwound:intact- Right heel is boggy, fluctuant and tender to touch Dressing procedure/placement/frequency: COntinue to float heels.   Begin Santyl to wounds on left lateral leg and left malleolus, daily.  Continue wound care to sacrum. Will order mattress with low air loss replacement.  Will not follow at this time.  Please re-consult if needed.  Domenic Moras RN BSN [place] Pager [phone]

## 2017-04-15 NOTE — Care Management (Signed)
Per ID will need 6 week course minimum of IV vanco.  PT has assessed patient and recommending SNF.  RNCM following for discharge planning needs.

## 2017-04-15 NOTE — Plan of Care (Signed)
Patient is progressing but has chronic back pain.  Susan Pratt

## 2017-04-15 NOTE — Progress Notes (Signed)
Infectious Disease Long Term IV Antibiotic Orders Susan Pratt [birth year]  Diagnosis: MRSA bacteremia- T spine discitis  Culture results MRSA  LABS Lab Results  Component Value Date   CREATININE 0.70 04/15/2017   Lab Results  Component Value Date   WBC 19.8 (H) 04/15/2017   HGB 12.4 04/15/2017   HCT 37.4 04/15/2017   MCV 92.5 04/15/2017   PLT 373 04/15/2017   Lab Results  Component Value Date   ESRSEDRATE 79 (H) 04/13/2017   Lab Results  Component Value Date   CRP 28.4 (H) 04/13/2017    Allergies:  Allergies  Allergen Reactions  . Lisinopril Swelling    Causes tongue swelling    Discharge antibiotics Vancomycin     1250             mg  every    18           hours .     Goal vancomycin trough 15-20.    Pharmacy to adjust dosing based on levels  PICC Care per protocol Labs weekly while on IV antibiotics -FAX weekly labs to [phone] CBC w diff   Comprehensive met panel Vancomycin Trough   CRP   Planned duration of antibiotics 6 weeks   Stop date 05/23/17 Follow up clinic datein 2-3 weeks   Leonel Ramsay, MD

## 2017-04-15 NOTE — Progress Notes (Signed)
Called Dr. Marcille Blanco regarding medication for congestion per patient request.  Appropriate orders were placed.  Phoebe Sharps N  04/15/2017 4:21 AM

## 2017-04-15 NOTE — Evaluation (Signed)
Physical Therapy Evaluation Patient Details Name: Susan Pratt MRN: [id number] DOB: 1941-04-05 Today's Date: 04/15/2017   History of Present Illness  presented to ER secondary to fall with L hip and low back pain; admitted with sepsis, MRSA bacteremia related to T9-10 discitis, osteomyelitis.  Clinical Impression  Upon evaluation, patient alert and oriented to basic information; agreeable to session with mod encouragement from therapist.  Reports 8-9/10 pain in back and "burning" down LEs (R > L).  Global weakness and deconditioning (LEs > UEs) noted throughout all extremities.  Requires act assist from therapist for all isolated movements of LEs; full ROM limited due to pain.  Refused attempts at repositioning, bed mobility or transition to edge of bed.  However, anticipate significant assist for any/all mobility efforts at this time; unable to complete at baseline level of functional ability (sup/mod indep).  Will continue mobility assessment/progression as medically appropriate. Would benefit from skilled PT to address above deficits and promote optimal return to PLOF; recommend transition to STR upon discharge from acute hospitalization.     Follow Up Recommendations SNF    Equipment Recommendations       Recommendations for Other Services       Precautions / Restrictions Precautions Precautions: Fall Restrictions Weight Bearing Restrictions: No      Mobility  Bed Mobility               General bed mobility comments: patient refused bed mobility or change of position due to pain; anticipate extensive (max/total assist +1-2) physical assist to complete  Transfers                 General transfer comment: patient refused  Ambulation/Gait             General Gait Details: patient refused   Stairs            Wheelchair Mobility    Modified Rankin (Stroke Patients Only)       Balance                                            Pertinent Vitals/Pain Pain Assessment: 0-10 Pain Score: 9  Pain Location: back, bilat LEs Pain Descriptors / Indicators: Aching;Grimacing;Guarding;Burning Pain Intervention(s): Limited activity within patient's tolerance;Patient requesting pain meds-RN notified;Monitored during session;Repositioned    Home Living Family/patient expects to be discharged to:: Private residence Living Arrangements: Spouse/significant other Available Help at Discharge: Family Type of Home: House Home Access: Level entry     Home Layout: One level        Prior Function Level of Independence: Needs assistance         Comments: WC level (manual) as primary mobility x10 years, performing SPT without assist device to/from seating surfaces. Husband assists with ADLs, household activities as appropriate.  Denies recent fall history.     Hand Dominance        Extremity/Trunk Assessment   Upper Extremity Assessment Upper Extremity Assessment: Overall WFL for tasks assessed(grossly 4-/5 throughout)    Lower Extremity Assessment Lower Extremity Assessment: Generalized weakness(grossly 3-/5, generally edematous, throughout)       Communication   Communication: No difficulties  Cognition Arousal/Alertness: Awake/alert Behavior During Therapy: WFL for tasks assessed/performed Overall Cognitive Status: Within Functional Limits for tasks assessed  General Comments      Exercises Other Exercises Other Exercises: Supine LE therex, 1x10, act assist ROM: ankle pumps, quad sets, SAQs, heel slides, hip abduct/adduct.  Unable to move throughout full range without assist from therapist.  Constant cuing, tactile facilitation for full effort, activation with all therex.   Assessment/Plan    PT Assessment Patient needs continued PT services  PT Problem List Decreased strength;Decreased range of motion;Decreased balance;Decreased activity  tolerance;Decreased mobility;Decreased coordination;Decreased cognition;Decreased knowledge of use of DME;Decreased safety awareness;Decreased knowledge of precautions;Pain       PT Treatment Interventions DME instruction;Functional mobility training;Therapeutic activities;Therapeutic exercise;Balance training;Patient/family education    PT Goals (Current goals can be found in the Care Plan section)  Acute Rehab PT Goals Patient Stated Goal: to make the pain better PT Goal Formulation: With patient Time For Goal Achievement: 04/29/17 Potential to Achieve Goals: Fair Additional Goals Additional Goal #1: (Assess and est goals for OOB activities as appropriate.)    Frequency Min 2X/week   Barriers to discharge        Co-evaluation               AM-PAC PT "6 Clicks" Daily Activity  Outcome Measure Difficulty turning over in bed (including adjusting bedclothes, sheets and blankets)?: Unable Difficulty moving from lying on back to sitting on the side of the bed? : Unable Difficulty sitting down on and standing up from a chair with arms (e.g., wheelchair, bedside commode, etc,.)?: Unable Help needed moving to and from a bed to chair (including a wheelchair)?: Total Help needed walking in hospital room?: Total Help needed climbing 3-5 steps with a railing? : Total 6 Click Score: 6    End of Session Equipment Utilized During Treatment: Gait belt Activity Tolerance: Patient tolerated treatment well Patient left: in bed;with call bell/phone within reach;with bed alarm set;with family/visitor present Nurse Communication: Mobility status PT Visit Diagnosis: Muscle weakness (generalized) (M62.81);Difficulty in walking, not elsewhere classified (R26.2);Pain Pain - Right/Left: Right Pain - part of body: Leg    Time: 7262-0355 PT Time Calculation (min) (ACUTE ONLY): 28 min   Charges:   PT Evaluation $PT Eval Moderate Complexity: 1 Mod PT Treatments $Therapeutic Exercise: 8-22  mins   PT G Codes:   PT G-Codes **NOT FOR INPATIENT CLASS** Functional Assessment Tool Used: AM-PAC 6 Clicks Basic Mobility Functional Limitation: Mobility: Walking and moving around Mobility: Walking and Moving Around Current Status (H7416): 100 percent impaired, limited or restricted Mobility: Walking and Moving Around Goal Status (L8453): At least 20 percent but less than 40 percent impaired, limited or restricted    Starasia Sinko H. Owens Shark, PT, DPT, NCS 04/15/17, 11:25 AM [phone]

## 2017-04-15 NOTE — Progress Notes (Signed)
[facility] INFECTIOUS DISE[facility] PROGRESS NOTE Date of Admission:  04/10/2017     ID: Susan Pratt is a 76 y.o. female with MRSA bacteremia Active Problems:   Sepsis ([place])   Pressure injury of skin   Subjective: Afebrile. Out of unit. Still with back pain. PICC placed  ROS  Eleven systems are reviewed and negative except per hpi  Medications:  Antibiotics Given (last 72 hours)    Date/Time Action Medication Dose Rate   04/13/17 0926 New Bag/Given   vancomycin (VANCOCIN) 1,250 mg in sodium chloride 0.9 % 250 mL IVPB 1,250 mg 166.7 mL/hr   04/14/17 0900 New Bag/Given   vancomycin (VANCOCIN) 1,250 mg in sodium chloride 0.9 % 250 mL IVPB 1,250 mg 166.7 mL/hr   04/15/17 0833 New Bag/Given   vancomycin (VANCOCIN) 1,250 mg in sodium chloride 0.9 % 250 mL IVPB 1,250 mg 166.7 mL/hr     . aspirin EC  81 mg Oral Daily  . Chlorhexidine Gluconate Cloth  6 each Topical Q0600  . cholecalciferol  1,000 Units Oral Daily  . collagenase   Topical Daily  . enoxaparin (LOVENOX) injection  40 mg Subcutaneous Q24H  . fentaNYL  12.5 mcg Transdermal Q72H  . gabapentin  600 mg Oral BID  . insulin aspart  0-20 Units Subcutaneous TID WC  . insulin aspart  0-5 Units Subcutaneous QHS  . multivitamin with minerals  1 tablet Oral Daily  . multivitamin-lutein  1 capsule Oral Daily  . mupirocin ointment  1 application Nasal BID  . oxybutynin  5 mg Oral BID  . polyethylene glycol  17 g Oral Daily  . protein supplement shake  11 oz Oral BID BM  . sodium chloride flush  10-40 mL Intracatheter Q12H  . vitamin B-12  1,000 mcg Oral Daily  . vitamin C  500 mg Oral Daily    Objective: Vital signs in last 24 hours: Temp:  [98.6 F (37 C)-98.7 F (37.1 C)] 98.7 F (37.1 C) (11/16 1324) Pulse Rate:  [106-116] 116 (11/16 1324) Resp:  [16-18] 16 (11/16 0516) BP: (154-161)/(81-91) 154/81 (11/16 1324) SpO2:  [94 %-98 %] 98 % (11/16 1324) Constitutional:  lethargic HENT: [place]/AT, PERRLA, no scleral  icterus Mouth/Throat: Oropharynx is clear and dry. No oropharyngeal exudate.  Cardiovascular: Normal rate, regular rhythm and normal heart sounds.  Pulmonary/Chest: Effort normal and breath sounds normal. No respiratory distress.  has no wheezes.  Neck = supple, no nuchal rigidity Abdominal: Soft. Bowel sounds are normal.  exhibits no distension. There is no tenderness.  Lymphadenopathy: no cervical adenopathy. No axillary adenopathy Neurological: lethargi Skin: r heel wit shallow ulcer, L leg with 1 cm ulcer with some purulence Psychiatric: unable  Ext grimaces with passive rom of l hip    Lab Results Recent Labs    04/13/17 0323 04/14/17 0742 04/15/17 0440  WBC 22.9*  --  19.8*  HGB 12.4  --  12.4  HCT 38.3  --  37.4  NA 134*  --   --   K 3.5  --   --   CL 97*  --   --   CO2 26  --   --   BUN 25*  --   --   CREATININE 1.09* 0.70 0.70    Results for orders placed or performed during the hospital encounter of 04/10/17  Urine culture     Status: Abnormal   Collection Time: 04/10/17  6:40 AM  Result Value Ref Range Status   Specimen Description URINE,  RANDOM  Final   Special Requests NONE  Final   Culture >=100,000 COLONIES/mL ESCHERICHIA COLI (A)  Final   Report Status 04/12/2017 FINAL  Final   Organism ID, Bacteria ESCHERICHIA COLI (A)  Final      Susceptibility   Escherichia coli - MIC*    AMPICILLIN <=2 SENSITIVE Sensitive     CEFAZOLIN <=4 SENSITIVE Sensitive     CEFTRIAXONE <=1 SENSITIVE Sensitive     CIPROFLOXACIN <=0.25 SENSITIVE Sensitive     GENTAMICIN <=1 SENSITIVE Sensitive     IMIPENEM <=0.25 SENSITIVE Sensitive     NITROFURANTOIN <=16 SENSITIVE Sensitive     TRIMETH/SULFA <=20 SENSITIVE Sensitive     AMPICILLIN/SULBACTAM <=2 SENSITIVE Sensitive     PIP/TAZO <=4 SENSITIVE Sensitive     Extended ESBL NEGATIVE Sensitive     * >=100,000 COLONIES/mL ESCHERICHIA COLI  Culture, blood (Routine x 2)     Status: Abnormal   Collection Time: 04/10/17  6:48 AM   Result Value Ref Range Status   Specimen Description BLOOD RIGHT ANTECUBITAL  Final   Special Requests   Final    BOTTLES DRAWN AEROBIC AND ANAEROBIC Blood Culture adequate volume   Culture  Setup Time   Final    GRAM POSITIVE COCCI IN BOTH AEROBIC AND ANAEROBIC BOTTLES CRITICAL RESULT CALLED TO, READ BACK BY AND VERIFIED WITH: JASON ROBBINS AT 2122 04/10/17.PMH    Culture (A)  Final    STAPHYLOCOCCUS AUREUS SUSCEPTIBILITIES PERFORMED ON PREVIOUS CULTURE WITHIN THE LAST 5 DAYS. Performed at [facility], [place] [address], [place], [place] [address]    Report Status 04/13/2017 FINAL  Final  Blood Culture ID Panel (Reflexed)     Status: Abnormal   Collection Time: 04/10/17  6:48 AM  Result Value Ref Range Status   Enterococcus species NOT DETECTED NOT DETECTED Final   Listeria monocytogenes NOT DETECTED NOT DETECTED Final   Staphylococcus species DETECTED (A) NOT DETECTED Final    Comment: CRITICAL RESULT CALLED TO, READ BACK BY AND VERIFIED WITH: JASON ROBBINS AT 2122 04/10/17.PMH    Staphylococcus aureus DETECTED (A) NOT DETECTED Final    Comment: Methicillin (oxacillin)-resistant Staphylococcus aureus (MRSA). MRSA is predictably resistant to beta-lactam antibiotics (except ceftaroline). Preferred therapy is vancomycin unless clinically contraindicated. Patient requires contact precautions if  hospitalized. CRITICAL RESULT CALLED TO, READ BACK BY AND VERIFIED WITH: JASON ROBBINS AT 2122 04/10/17.PMH    Methicillin resistance DETECTED (A) NOT DETECTED Final    Comment: CRITICAL RESULT CALLED TO, READ BACK BY AND VERIFIED WITH: JASON ROBBINS AT 2122 04/10/17.PMH    Streptococcus species NOT DETECTED NOT DETECTED Final   Streptococcus agalactiae NOT DETECTED NOT DETECTED Final   Streptococcus pneumoniae NOT DETECTED NOT DETECTED Final   Streptococcus pyogenes NOT DETECTED NOT DETECTED Final   Acinetobacter baumannii NOT DETECTED NOT DETECTED Final   Enterobacteriaceae  species NOT DETECTED NOT DETECTED Final   Enterobacter cloacae complex NOT DETECTED NOT DETECTED Final   Escherichia coli NOT DETECTED NOT DETECTED Final   Klebsiella oxytoca NOT DETECTED NOT DETECTED Final   Klebsiella pneumoniae NOT DETECTED NOT DETECTED Final   Proteus species NOT DETECTED NOT DETECTED Final   Serratia marcescens NOT DETECTED NOT DETECTED Final   Haemophilus influenzae NOT DETECTED NOT DETECTED Final   Neisseria meningitidis NOT DETECTED NOT DETECTED Final   Pseudomonas aeruginosa NOT DETECTED NOT DETECTED Final   Candida albicans NOT DETECTED NOT DETECTED Final   Candida glabrata NOT DETECTED NOT DETECTED Final   Candida krusei NOT  DETECTED NOT DETECTED Final   Candida parapsilosis NOT DETECTED NOT DETECTED Final   Candida tropicalis NOT DETECTED NOT DETECTED Final  Culture, blood (Routine x 2)     Status: Abnormal   Collection Time: 04/10/17  7:38 AM  Result Value Ref Range Status   Specimen Description BLOOD RIGHT ASSIST CONTROL  Final   Special Requests   Final    BOTTLES DRAWN AEROBIC AND ANAEROBIC Blood Culture adequate volume   Culture  Setup Time   Final    GRAM POSITIVE COCCI IN BOTH AEROBIC AND ANAEROBIC BOTTLES CRITICAL RESULT CALLED TO, READ BACK BY AND VERIFIED WITH: JASON ROBBINS AT 2122 04/10/17.PMH    Culture METHICILLIN RESISTANT STAPHYLOCOCCUS AUREUS (A)  Final   Report Status 04/13/2017 FINAL  Final   Organism ID, Bacteria METHICILLIN RESISTANT STAPHYLOCOCCUS AUREUS  Final      Susceptibility   Methicillin resistant staphylococcus aureus - MIC*    CIPROFLOXACIN >=8 RESISTANT Resistant     ERYTHROMYCIN >=8 RESISTANT Resistant     GENTAMICIN <=0.5 SENSITIVE Sensitive     OXACILLIN >=4 RESISTANT Resistant     TETRACYCLINE <=1 SENSITIVE Sensitive     VANCOMYCIN 1 SENSITIVE Sensitive     TRIMETH/SULFA <=10 SENSITIVE Sensitive     CLINDAMYCIN <=0.25 SENSITIVE Sensitive     RIFAMPIN <=0.5 SENSITIVE Sensitive     Inducible Clindamycin NEGATIVE  Sensitive     * METHICILLIN RESISTANT STAPHYLOCOCCUS AUREUS  MRSA PCR Screening     Status: Abnormal   Collection Time: 04/11/17  9:40 AM  Result Value Ref Range Status   MRSA by PCR POSITIVE (A) NEGATIVE Final    Comment:        The GeneXpert MRSA Assay (FDA approved for NASAL specimens only), is one component of a comprehensive MRSA colonization surveillance program. It is not intended to diagnose MRSA infection nor to guide or monitor treatment for MRSA infections. RESULT CALLED TO, READ BACK BY AND VERIFIED WITH: BRANDY MANSFIELD @ 1108 04/11/17 [place]   Culture, blood (single) w Reflex to ID Panel     Status: None (Preliminary result)   Collection Time: 04/11/17  9:09 PM  Result Value Ref Range Status   Specimen Description BLOOD LEFT ARM  Final   Special Requests   Final    BOTTLES DRAWN AEROBIC AND ANAEROBIC Blood Culture adequate volume   Culture NO GROWTH 4 DAYS  Final   Report Status PENDING  Incomplete  Aerobic Culture (superficial specimen)     Status: None (Preliminary result)   Collection Time: 04/12/17  2:20 PM  Result Value Ref Range Status   Specimen Description LEG WOUND  Final   Special Requests Immunocompromised  Final   Gram Stain   Final    RARE WBC PRESENT,BOTH PMN AND MONONUCLEAR RARE GRAM POSITIVE COCCI Performed at [facility], [place] [address] [place], [place] [address]    Culture FEW METHICILLIN RESISTANT STAPHYLOCOCCUS AUREUS  Final   Report Status PENDING  Incomplete   Organism ID, Bacteria METHICILLIN RESISTANT STAPHYLOCOCCUS AUREUS  Final      Susceptibility   Methicillin resistant staphylococcus aureus - MIC*    CIPROFLOXACIN >=8 RESISTANT Resistant     ERYTHROMYCIN >=8 RESISTANT Resistant     GENTAMICIN <=0.5 SENSITIVE Sensitive     OXACILLIN >=4 RESISTANT Resistant     TETRACYCLINE <=1 SENSITIVE Sensitive     VANCOMYCIN 1 SENSITIVE Sensitive     TRIMETH/SULFA <=10 SENSITIVE Sensitive     CLINDAMYCIN <=0.25 SENSITIVE Sensitive  RIFAMPIN <=0.5 SENSITIVE Sensitive     Inducible Clindamycin NEGATIVE Sensitive     * FEW METHICILLIN RESISTANT STAPHYLOCOCCUS AUREUS    Studies/Results: No results found. Impressions:  - Normal LVEF. Limited visualization of the right heart and valves.   Study is inadequate for assessment of endocarditis.  Assessment/Plan: Susan Pratt is a 76 y.o. female with MRSA bacteremia and hip/back pain. Had L hip injection 11/1 for chronic pain at the site. She has RA and is on plaquenil and MTX as otpt. She does have some skin wounds as likely source. Some concern she has seeded her back or L hip. Also has R TKR and prosthetic L shoulder but no evidence infection at those sites.  She also has a pelvic stimulator in place.  Has E coli in urine but UA with only 0-5 wbc so likely contaminant.  CRP 11, ESR 8  initially but on repeat 28 and 79 CT 11/13 suggests acuteT9-10 discitis/osteomyelitis  Fu bcx 11/12 ngtd TTE neg but poor study.   Wound cx with same MRSA from L calf wound. Likely source of the bacteremia.   Recommendations Continue vanco - at this point given findings on CTscan with T9-10 Discitis will need 6 week course minimum of IV vanco Since will need prolonged IV abx course no need for TEE unless she develops concern for valve rupture or valve ring abscess  See abx order sheet but will need vanco dosing finalized prior to DC  Thank you very much for the consult. Will follow with you.  Leonel Ramsay   04/15/2017, 4:25 PM

## 2017-04-15 NOTE — Progress Notes (Signed)
Pharmacy Antibiotic Note  Susan Pratt is a 76 y.o. female admitted on 04/10/2017 with MRSA bacteremia.  Pharmacy has been consulted for vancomycin dosing.  11/13: Patient received IV contrast and SCr 1.13 (0.66) 11/15: Scr has improved from 1.13 > 1.09 > 0.7  Plan: Vancomycin trough level was 9, will decrease vancomycin interval to vancomycin 1.25gm iv q18h, will redraw 11/18@1330 . Goal vancomycin trough 15-20   Ke = 0.4 T1/2 = 17 hrs Vd = 61.8 L  Height: 5\' 10"  (177.8 cm) Weight: 260 lb (117.9 kg) IBW/kg (Calculated) : 68.5  Temp (24hrs), Avg:98.6 F (37 C), Min:98.6 F (37 C), Max:98.7 F (37.1 C)  Recent Labs  Lab 04/10/17 0640 04/11/17 0333 04/12/17 1014 04/12/17 1510 04/13/17 0315 04/13/17 0323 04/14/17 0742 04/15/17 0440 04/15/17 0817  WBC 21.0* 24.6* 27.4*  --   --  22.9*  --  19.8*  --   CREATININE 0.85 0.66 1.13*  --   --  1.09* 0.70 0.70  --   LATICACIDVEN 1.5  --   --   --   --   --   --   --   --   VANCOTROUGH  --   --   --  28*  --   --   --   --   --   VANCORANDOM  --   --   --   --  17  --   --   --  9    Estimated Creatinine Clearance: 82.1 mL/min (by C-G formula based on SCr of 0.7 mg/dL).    Allergies  Allergen Reactions  . Lisinopril Swelling    Causes tongue swelling    Antimicrobials this admission: Zosyn 11/11 >> 11/12 Vancomycin 11/11 >>   Microbiology results: 11/11 BCx: MRSA 11/11 UCx: E coli (per ID likely contaminant) 11/12 MRSA PCR: Positive  Thank you for allowing pharmacy to be a part of this patient's care. Thomasenia Sales, PharmD, MBA, [facility] Clinical Pharmacist [facility]    04/15/2017 10:07 AM

## 2017-04-16 ENCOUNTER — Inpatient Hospital Stay: Payer: Medicare Other

## 2017-04-16 LAB — AEROBIC CULTURE W GRAM STAIN (SUPERFICIAL SPECIMEN)

## 2017-04-16 LAB — CBC
HCT: 36.9 % (ref 35.0–47.0)
Hemoglobin: 12.1 g/dL (ref 12.0–16.0)
MCH: 30.3 pg (ref 26.0–34.0)
MCHC: 32.8 g/dL (ref 32.0–36.0)
MCV: 92.5 fL (ref 80.0–100.0)
PLATELETS: 459 10*3/uL — AB (ref 150–440)
RBC: 3.99 MIL/uL (ref 3.80–5.20)
RDW: 15.5 % — ABNORMAL HIGH (ref 11.5–14.5)
WBC: 21.7 10*3/uL — AB (ref 3.6–11.0)

## 2017-04-16 LAB — GLUCOSE, CAPILLARY
GLUCOSE-CAPILLARY: 153 mg/dL — AB (ref 65–99)
Glucose-Capillary: 190 mg/dL — ABNORMAL HIGH (ref 65–99)
Glucose-Capillary: 130 mg/dL — ABNORMAL HIGH (ref 65–99)
Glucose-Capillary: 155 mg/dL — ABNORMAL HIGH (ref 65–99)

## 2017-04-16 LAB — CULTURE, BLOOD (SINGLE)
Culture: NO GROWTH
Special Requests: ADEQUATE

## 2017-04-16 LAB — PHOSPHORUS: PHOSPHORUS: 3.9 mg/dL (ref 2.5–4.6)

## 2017-04-16 LAB — AEROBIC CULTURE  (SUPERFICIAL SPECIMEN)

## 2017-04-16 LAB — MAGNESIUM: MAGNESIUM: 1.9 mg/dL (ref 1.7–2.4)

## 2017-04-16 MED ORDER — HYDROMORPHONE HCL 1 MG/ML IJ SOLN
1.0000 mg | Freq: Four times a day (QID) | INTRAMUSCULAR | Status: DC | PRN
  Administered 2017-04-16 – 2017-04-17 (×3): 1 mg via INTRAVENOUS
  Filled 2017-04-16 (×5): qty 1

## 2017-04-16 NOTE — Progress Notes (Signed)
[place] [facility] [place] NAME: Susan Pratt    MR#:  [id number]  DATE OF BIRTH:  [birth year]  SUBJECTIVE:  having cough, requiring oxygen 2 L and saturation 93%.   Pt. Here due to sepsis due Diskitis.  Blood Culture + for MRSA. Repeat BC on 11/12 are (-) so far.  Received PICC line yesterday, still has a little bit cough but main complaint is pain in the mid back area.  She is willing to try fentanyl patch.  No fever.  REVIEW OF SYSTEMS:   Review of Systems  Constitutional: Negative for chills and fever.  HENT: Negative for congestion, hearing loss and tinnitus.   Eyes: Negative for blurred vision, double vision and photophobia.  Respiratory: Positive for cough. Negative for hemoptysis, shortness of breath and wheezing.   Cardiovascular: Negative for chest pain, palpitations, orthopnea, leg swelling and PND.  Gastrointestinal: Negative for abdominal pain, diarrhea, nausea and vomiting.  Genitourinary: Negative for dysuria, hematuria and urgency.  Musculoskeletal: Negative for myalgias and neck pain.  Skin: Negative for rash.  Neurological: Negative for dizziness, sensory change, focal weakness, seizures, weakness and headaches.  Psychiatric/Behavioral: Negative for memory loss. The patient does not have insomnia.   All other systems reviewed and are negative.  DRUG ALLERGIES:   Allergies  Allergen Reactions  . Lisinopril Swelling    Causes tongue swelling    VITALS:  Blood pressure (!) 163/91, pulse (!) 111, temperature 98.7 F (37.1 C), temperature source Oral, resp. rate 20, height 5\' 10"  (1.778 m), weight 117.9 kg (260 lb), SpO2 94 %.  PHYSICAL EXAMINATION:   Physical Exam  GENERAL:  76 y.o.-year-old obese patient lying in the bed in some distress due to back pain.  EYES: Pupils equal, round, reactive to light and accommodation. No scleral icterus. Extraocular muscles intact.  HEENT: Head atraumatic, normocephalic. Oropharynx  and nasopharynx dry NECK:  Supple, no jugular venous distention. No thyroid enlargement, no tenderness.  LUNGS diminished  breath sounds bilaterally, no wheezing or rales.   CARDIOVASCULAR: S1, S2 normal. No murmurs, rubs, or gallops.  ABDOMEN: Soft, nontender, nondistended. Bowel sounds present. No organomegaly or mass.  EXTREMITIES: No cyanosis, clubbing or edema b/l.    NEUROLOGIC: No focal motor or sensory deficits appreciated bilaterally, globally weak. PSYCHIATRIC: AAO X 3.   sKIN: Bilateral heel superficial small skin tears prior to admission  LABORATORY PANEL:  CBC Recent Labs  Lab 04/16/17 0342  WBC 21.7*  HGB 12.1  HCT 36.9  PLT 459*    Chemistries  Recent Labs  Lab 04/10/17 0640  04/13/17 0323  04/15/17 0440 04/16/17 0342  NA 133*   < > 134*  --   --   --   K 4.6   < > 3.5  --   --   --   CL 99*   < > 97*  --   --   --   CO2 24   < > 26  --   --   --   GLUCOSE 217*   < > 154*  --   --   --   BUN 26*   < > 25*  --   --   --   CREATININE 0.85   < > 1.09*   < > 0.70  --   CALCIUM 9.4   < > 8.9  --   --   --   MG  --    < >  --   --   --  1.9  AST 16  --   --   --   --   --   ALT 29  --   --   --   --   --   ALKPHOS 67  --   --   --   --   --   BILITOT 1.6*  --   --   --   --   --    < > = values in this interval not displayed.   Cardiac Enzymes Recent Labs  Lab 04/10/17 0640  TROPONINI <0.03   RADIOLOGY:  No results found. ASSESSMENT AND PLAN:   76 year old female with past medical history of arthritis, diabetes, hypertension comes to the emergency room with altered mental status and fever.  She has been complaining of chronic back pain and left hip pain. She had a steroid injection a left hip last Thursday as an outpatient.  1. MRSA bacteremia-this is the source of patient's sepsis. 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 Cannot do MRI of the thoracic/lumbar sinus patient has a spinal  stimulator. -Appreciate infectious disease input and patient will likely need long-term IV antibiotics. -Patient needs vancomycin Through December 24.  Patient has worsened white count up to 21.7 today, has been having some cough and hypoxia so I will get x-ray of the chest ,continue oxygen add incentive spirometry, add bronchodilators.  Patient has acute respiratory failure with hypoxia, elevated white count, exclude pneumonia.  2. Metabolic encephalopathy. Likely secondary to the sepsis. - improved 3. Back pain-this is secondary to patient's severe discitis and also previous back surgeries.  Started on fentanyl patch, discontinue IV Dilaudid, continue tramadol. - , 4. Left hip bursitis. As noted above she had a steroid injection on Thursday. There is some bruising around the site. CT scan of the abdomen got some cuts down that area and do not see any obvious abscess.  pt was seen by orthopedic and no evidence of Hip infection.    5. Chronic pain-  continue oxycodone, tramadol, fentanyl patch.-   6. Diabetes - continue sliding scale insulin. Blood sugar stable.  7. Hypokalemia- improved w/ supplementation.   #8 /deconditioning: Physical therapy recommends skilled nursing.  Patient has global weakness, deconditioning throughout all extremities, patient is at home almost in the bed ,all the time and taken care by husband. (AS PER HER) Patient benefits from skilled physical therapy, skilled nursing as per physical therapy recommendation, likely discharge,.. Case discussed with Care Management/Social Worker.  CODE STATUS: Full  DVT Prophylaxis: Lovenox  TOTAL CRITICAL TIME TAKING CARE OF THIS PATIENT: 30 minutes.    Note: This dictation was prepared with Dragon dictation along with smaller phrase technology. Any transcriptional errors that result from this process are unintentional.  Epifanio Lesches M.D on 04/16/2017 at 9:29 AM  Between 7am to 6pm - Pager - [phone]  After 6pm  go to [email or web address] - password [email or web address] [facility]  Office  [phone]  CC: Primary care physician; Adline Potter, MD

## 2017-04-16 NOTE — Plan of Care (Signed)
WBC remains high at 21.7, despite administration of IV antibiotics.  MD note suggests this will be a long process.   Dola Argyle, RN

## 2017-04-16 NOTE — Plan of Care (Signed)
Patient is progressing.  She complains of burning in her legs and desires to get cortisone shots.  Will inform day shift of this.  Susan Pratt

## 2017-04-16 NOTE — NC FL2 (Signed)
[place] LEVEL OF CARE SCREENING TOOL     IDENTIFICATION  Patient Name: Susan Pratt Birthdate: 1940/07/06 Sex: female Admission Date (Current Location): 04/10/2017  [place] and [place] Number:  Engineering geologist and Address:  [facility], [address], [place], [place] [address]      Provider Number: [id number]  Attending Physician Name and Address:  Epifanio Lesches, MD  Relative Name and Phone Number:       Current Level of Care: Hospital Recommended Level of Care: [place] Prior Approval Number:    Date Approved/Denied:   PASRR Number: [id number])  Discharge Plan: SNF    Current Diagnoses: Patient Active Problem List   Diagnosis Date Noted  . Pressure injury of skin 04/11/2017  . Sepsis ([place]) 04/10/2017  . 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 Arthritis of shoulder region, degenerative 10/10/2015  . Rotator cuff arthropathy 10/10/2015  . Acute bronchospasm 08/22/2015  . Vitamin D deficiency 07/04/2015  . Vitamin B12 deficiency 07/04/2015  . Hx pulmonary embolism 07/04/2015  . Bronchospastic airway disease 06/20/2015  . Rheumatoid arthritis ([place]) 06/19/2015  . Obesity, Class II, BMI 35-39.9 06/19/2015  . At risk for falling 06/18/2015  . Calcification of bronchus or trachea 01/24/2014  . Hx of cancer of endometrium 11/09/2012  . Hyperlipidemia 11/09/2012  . Incomplete bladder emptying 02/01/2012  . Mixed incontinence 02/01/2012  . Type 2 diabetes, controlled, with neuropathy ([place]) 01/12/2011  . Generalized OA 01/12/2011  . Hypertension 01/12/2011  . Back ache 01/12/2011  . Peripheral nerve disease 01/12/2011    Orientation RESPIRATION BLADDER Height & Weight     Self, Situation, Place  O2(2 Liters Oxygen ) Incontinent Weight: 260 lb (117.9 kg) Height:  5\' 10"  (177.8 cm)  BEHAVIORAL SYMPTOMS/MOOD NEUROLOGICAL BOWEL NUTRITION STATUS      Incontinent Diet(Diet:  Carb Modified. )  AMBULATORY STATUS COMMUNICATION OF NEEDS Skin   Extensive Assist Verbally PU Stage and Appropriate Care(pressure ulcer on left ankle, left leg and buttocks. )                       Personal Care Assistance Level of Assistance  Bathing, Feeding, Dressing Bathing Assistance: Limited assistance Feeding assistance: Independent Dressing Assistance: Limited assistance     Functional Limitations Info  Sight, Hearing, Speech Sight Info: Adequate Hearing Info: Adequate Speech Info: Adequate    SPECIAL CARE FACTORS FREQUENCY  PT (By licensed PT), OT (By licensed OT)     PT Frequency: (5) OT Frequency: (5)            Contractures      Additional Factors Info  Code Status, Allergies, Isolation Precautions Code Status Info: (Full Code. ) Allergies Info: (Lisinopril)     Isolation Precautions Info: (MRSA Nasal Swab. )     Current Medications (04/16/2017):  This is the current hospital active medication list Current Facility-Administered Medications  Medication Dose Route Frequency Provider Last Rate Last Dose  . acetaminophen (TYLENOL) tablet 650 mg  650 mg Oral Q6H PRN Baxter Hire, MD   650 mg at 04/10/17 2135  . albuterol (PROVENTIL) (2.5 MG/3ML) 0.083% nebulizer solution 2.5 mg  2.5 mg Nebulization Q6H PRN Baxter Hire, MD      . aspirin EC tablet 81 mg  81 mg Oral Daily Baxter Hire, MD   81 mg at 04/16/17 1035  . cholecalciferol (VITAMIN D) tablet 1,000 Units  1,000 Units Oral Daily Harrel Lemon  D, MD   1,000 Units at 04/16/17 1034  . collagenase (SANTYL) ointment   Topical Daily Epifanio Lesches, MD      . enoxaparin (LOVENOX) injection 40 mg  40 mg Subcutaneous Q24H Wilhelmina Mcardle, MD   40 mg at 04/16/17 1034  . fentaNYL (DURAGESIC - dosed mcg/hr) 12.5 mcg  12.5 mcg Transdermal Q72H Epifanio Lesches, MD   12.5 mcg at 04/15/17 1114  . gabapentin (NEURONTIN) capsule 600 mg  600 mg Oral BID Anette Riedel, RPH   600 mg at  04/16/17 1034  . guaiFENesin (ROBITUSSIN) 100 MG/5ML solution 400 mg  400 mg Oral Q4H PRN Harrie Foreman, MD   400 mg at 04/15/17 1113  . insulin aspart (novoLOG) injection 0-20 Units  0-20 Units Subcutaneous TID WC Baxter Hire, MD   4 Units at 04/16/17 1211  . insulin aspart (novoLOG) injection 0-5 Units  0-5 Units Subcutaneous QHS Baxter Hire, MD      . MEDLINE mouth rinse  15 mL Mouth Rinse BID Epifanio Lesches, MD   15 mL at 04/16/17 1053  . multivitamin with minerals tablet 1 tablet  1 tablet Oral Daily Epifanio Lesches, MD   1 tablet at 04/16/17 1034  . multivitamin-lutein (OCUVITE-LUTEIN) capsule 1 capsule  1 capsule Oral Daily Epifanio Lesches, MD   1 capsule at 04/16/17 1034  . ondansetron (ZOFRAN) injection 4 mg  4 mg Intravenous Q6H PRN Baxter Hire, MD      . oxybutynin (DITROPAN) tablet 5 mg  5 mg Oral BID Baxter Hire, MD   5 mg at 04/16/17 1035  . polyethylene glycol (MIRALAX / GLYCOLAX) packet 17 g  17 g Oral Daily Wilhelmina Mcardle, MD   17 g at 04/16/17 1034  . protein supplement (PREMIER PROTEIN) liquid  11 oz Oral BID BM Epifanio Lesches, MD   11 oz at 04/16/17 1107  . sodium chloride flush (NS) 0.9 % injection 10-40 mL  10-40 mL Intracatheter Q12H Epifanio Lesches, MD   10 mL at 04/16/17 1053  . sodium chloride flush (NS) 0.9 % injection 10-40 mL  10-40 mL Intracatheter PRN Epifanio Lesches, MD      . traMADol Veatrice Bourbon) tablet 100 mg  100 mg Oral Q6H PRN Awilda Bill, NP   100 mg at 04/16/17 1211  . vancomycin (VANCOCIN) 1,250 mg in sodium chloride 0.9 % 250 mL IVPB  1,250 mg Intravenous Q18H Coffee, Donna Christen, [facility]   Stopped at 04/16/17 0334  . vitamin B-12 (CYANOCOBALAMIN) tablet 1,000 mcg  1,000 mcg Oral Daily Baxter Hire, MD   1,000 mcg at 04/16/17 1035  . vitamin C (ASCORBIC ACID) tablet 500 mg  500 mg Oral Daily Epifanio Lesches, MD   500 mg at 04/15/17 1728     Discharge Medications: Please see discharge summary  for a list of discharge medications.  Relevant Imaging Results:  Relevant Lab Results:   Additional Information (SSN: [id number])  Emir Nack, Veronia Beets, LCSW

## 2017-04-16 NOTE — Clinical Social Work Note (Signed)
Clinical Social Work Assessment  Patient Details  Name: Susan Pratt MRN: [id number] Date of Birth: 17-May-1941  Date of referral:  04/16/17               Reason for consult:  Facility Placement                Permission sought to share information with:    Permission granted to share information::     Name::        Agency::     Relationship::     Contact Information:     Housing/Transportation Living arrangements for the past 2 months:  Single Family Home Source of Information:  Patient, Spouse Patient Interpreter Needed:  None Criminal Activity/Legal Involvement Pertinent to Current Situation/Hospitalization:  No - Comment as needed Significant Relationships:  Spouse Lives with:  Spouse Do you feel safe going back to the place where you live?  Yes Need for family participation in patient care:  Yes (Comment)  Care giving concerns:  Patient lives in [place] with her husband Shanon Brow [phone].   Social Worker assessment / plan:  Holiday representative (CSW) received SNF consult. PT is recommending SNF. CSW met with patient and her husband Shanon Brow was at bedside. CSW introduced self and explained role of CSW department. Patient was alert and oriented X4 and was laying in the bed. Patient reported that she lives in [place] with her husband Shanon Brow. CSW explained SNF process. Patient and husband adamantly refused SNF. Patient reported that she wants to go home with [facility] home [facility] and requested PT Orvan Seen. Per husband he feels comfortable taking patient home and requested EMS for transport home. RN case manager aware of above. CSW will continue to follow and assist as needed.     Employment status:  Disabled (Comment on whether or not currently receiving Disability) Insurance information:  Managed Medicare PT Recommendations:  Conception / Referral to community resources:  [place]  Patient/Family's Response to care:  Patient and  husband refused SNF.   Patient/Family's Understanding of and Emotional Response to Diagnosis, Current Treatment, and Prognosis:  Patietn and her husband were pleasant and thanked CSW for assistance.   Emotional Assessment Appearance:  Appears stated age Attitude/Demeanor/Rapport:    Affect (typically observed):  Pleasant Orientation:  Oriented to Self, Oriented to Place, Oriented to  Time, Oriented to Situation Alcohol / Substance use:  Not Applicable Psych involvement (Current and /or in the community):  No (Comment)  Discharge Needs  Concerns to be addressed:  Discharge Planning Concerns Readmission within the last 30 days:  No Current discharge risk:  Dependent with Mobility, Chronically ill Barriers to Discharge:  Continued Medical Work up   [employer], Veronia Beets, LCSW 04/16/2017, 3:43 PM

## 2017-04-17 LAB — GLUCOSE, CAPILLARY
GLUCOSE-CAPILLARY: 159 mg/dL — AB (ref 65–99)
GLUCOSE-CAPILLARY: 131 mg/dL — AB (ref 65–99)
GLUCOSE-CAPILLARY: 144 mg/dL — AB (ref 65–99)
Glucose-Capillary: 120 mg/dL — ABNORMAL HIGH (ref 65–99)

## 2017-04-17 NOTE — Progress Notes (Signed)
[place] at [place] NAME: Susan Pratt    MR#:  [id number]  DATE OF BIRTH:  [birth year]  SUBJECTIVE: Having cough, O2 sats 90% on room air.  Still having severe back pain and upper back area.  Remove Foley catheter today,.discussed with husband.   Pt. Here due to sepsis due Diskitis.  Blood Culture + for MRSA. Repeat BC on 11/12 are (-) so far.  Received PICC line yesterday, still has a little bit cough but main complaint is pain in the mid back area.  She is willing to try fentanyl patch.  No fever.  REVIEW OF SYSTEMS:   Review of Systems  Constitutional: Negative for chills and fever.  HENT: Negative for congestion, hearing loss and tinnitus.   Eyes: Negative for blurred vision, double vision and photophobia.  Respiratory: Positive for cough. Negative for hemoptysis, shortness of breath and wheezing.   Cardiovascular: Negative for chest pain, palpitations, orthopnea, leg swelling and PND.  Gastrointestinal: Negative for abdominal pain, diarrhea, nausea and vomiting.  Genitourinary: Negative for dysuria, hematuria and urgency.  Musculoskeletal: Positive for back pain. Negative for myalgias and neck pain.  Skin: Negative for rash.  Neurological: Positive for focal weakness. Negative for dizziness, sensory change, seizures, weakness and headaches.  Psychiatric/Behavioral: Negative for memory loss. The patient does not have insomnia.   All other systems reviewed and are negative.  DRUG ALLERGIES:   Allergies  Allergen Reactions  . Lisinopril Swelling    Causes tongue swelling    VITALS:  Blood pressure (!) 151/82, pulse (!) 108, temperature 98.6 F (37 C), temperature source Oral, resp. rate 20, height 5\' 10"  (1.778 m), weight 117.9 kg (260 lb), SpO2 96 %.  PHYSICAL EXAMINATION:   Physical Exam  GENERAL:  76 y.o.-year-old obese patient lying in the bed in some distress due to back pain.  Some cough. EYES: Pupils equal, round,  reactive to light and accommodation. No scleral icterus. Extraocular muscles intact.  HEENT: Head atraumatic, normocephalic. Oropharynx and nasopharynx dry NECK:  Supple, no jugular venous distention. No thyroid enlargement, no tenderness.  LUNGS diminished  breath sounds bilaterally, no wheezing or rales.   CARDIOVASCULAR: S1, S2 normal. No murmurs, rubs, or gallops.  ABDOMEN: Soft, nontender, nondistended. Bowel sounds present. No organomegaly or mass.  EXTREMITIES: No cyanosis, clubbing or edema b/l.    NEUROLOGIC: No focal motor or sensory deficits appreciated bilaterally, globally weak. PSYCHIATRIC: AAO X 3.   sKIN: Bilateral heel superficial small skin tears prior to admission  LABORATORY PANEL:  CBC Recent Labs  Lab 04/16/17 0342  WBC 21.7*  HGB 12.1  HCT 36.9  PLT 459*    Chemistries  Recent Labs  Lab 04/13/17 0323  04/15/17 0440 04/16/17 0342  NA 134*  --   --   --   K 3.5  --   --   --   CL 97*  --   --   --   CO2 26  --   --   --   GLUCOSE 154*  --   --   --   BUN 25*  --   --   --   CREATININE 1.09*   < > 0.70  --   CALCIUM 8.9  --   --   --   MG  --   --   --  1.9   < > = values in this interval not displayed.   Cardiac Enzymes No results for input(s): TROPONINI  in the last 168 hours. RADIOLOGY:  Dg Chest Port 1 View  Result Date: 04/16/2017 CLINICAL DATA:  Cough, shortness of Breath EXAM: PORTABLE CHEST 1 VIEW COMPARISON:  04/12/2017 FINDINGS: Right PICC line tip is at the cavoatrial junction. Cardiomegaly. Bibasilar opacities, likely atelectasis. Moderate left pleural effusion. No overt edema. IMPRESSION: Cardiomegaly. Moderate left pleural effusion. Bibasilar atelectasis. Electronically Signed   By: Rolm Baptise M.D.   On: 04/16/2017 10:57   ASSESSMENT AND PLAN:   76 year old female with past medical history of arthritis, diabetes, hypertension comes to the emergency room with altered mental status and fever.  She has been complaining of chronic back  pain and left hip pain. She had a steroid injection a left hip last Thursday as an outpatient.  1. MRSA bacteremia-this is the source of patient's sepsis. 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 Cannot do MRI of the thoracic/lumbar sinus patient has a spinal stimulator. -Appreciate infectious disease input and patient will likely need long-term IV antibiotics. -Patient needs vancomycin Through December 24.  , husband refused skilled nursing facility, patient will be going home with home health registered nurse, home health aide.  Discharge tomorrow.  Patient Foley was discontinued today, today if she is able to void herself, we weaned her off of oxygen, continue incentive spirometry, see how she does today and discharge tomorrow.   2. Metabolic encephalopathy. Likely secondary to the sepsis. - improved  3. Back pain-this is secondary to patient's severe discitis and also previous back surgeries.  Listed OxyContin yesterday, continue fentanyl patch, continue IV Dilaudid for breakthrough pain.  - , 4. Left hip bursitis. As noted above she had a steroid injection on Thursday. There is some bruising around the site. CT scan of the abdomen got some cuts down that area and do not see any obvious abscess.  pt was seen by orthopedic and no evidence of Hip infection.    5. Chronic pain-  continue oxycodone, tramadol, fentanyl patch.-   6. Diabetes - continue sliding scale insulin. Blood sugar stable.  7. Hypokalemia- improved w/ supplementation.   #8 /deconditioning: Physical therapy recommends skilled nursing.  Patient has global weakness, deconditioning throughout all extremities, patient is at home almost in the bed ,all the time and taken care by husband. (AS PER HER) Patient benefits from skilled physical therapy, skilled nursing as per physical therapy recommendation, patient and her husband refused skilled nursing... Case discussed with Care  Management/Social Worker.  CODE STATUS: Full  DVT Prophylaxis: Lovenox  TOTAL CRITICAL TIME TAKING CARE OF THIS PATIENT: 30 minutes.    Note: This dictation was prepared with Dragon dictation along with smaller phrase technology. Any transcriptional errors that result from this process are unintentional.  Epifanio Lesches M.D on 04/17/2017 at 12:38 PM  Between 7am to 6pm - Pager - [phone]  After 6pm go to [email or web address] - password [email or web address]  Office  [phone]  CC: Primary care physician; Adline Potter, MD

## 2017-04-18 LAB — GLUCOSE, CAPILLARY
GLUCOSE-CAPILLARY: 135 mg/dL — AB (ref 65–99)
GLUCOSE-CAPILLARY: 128 mg/dL — AB (ref 65–99)
Glucose-Capillary: 109 mg/dL — ABNORMAL HIGH (ref 65–99)

## 2017-04-18 LAB — VANCOMYCIN, TROUGH: VANCOMYCIN TR: 19 ug/mL (ref 15–20)

## 2017-04-18 LAB — CBC
HCT: 37.2 % (ref 35.0–47.0)
Hemoglobin: 12.3 g/dL (ref 12.0–16.0)
MCH: 30.7 pg (ref 26.0–34.0)
MCHC: 33 g/dL (ref 32.0–36.0)
MCV: 92.8 fL (ref 80.0–100.0)
PLATELETS: 564 10*3/uL — AB (ref 150–440)
RBC: 4.01 MIL/uL (ref 3.80–5.20)
RDW: 15.4 % — ABNORMAL HIGH (ref 11.5–14.5)
WBC: 18.8 10*3/uL — AB (ref 3.6–11.0)

## 2017-04-18 LAB — BASIC METABOLIC PANEL
ANION GAP: 11 (ref 5–15)
BUN: 33 mg/dL — ABNORMAL HIGH (ref 6–20)
CO2: 30 mmol/L (ref 22–32)
Calcium: 9.1 mg/dL (ref 8.9–10.3)
Chloride: 96 mmol/L — ABNORMAL LOW (ref 101–111)
Creatinine, Ser: 0.72 mg/dL (ref 0.44–1.00)
Glucose, Bld: 146 mg/dL — ABNORMAL HIGH (ref 65–99)
POTASSIUM: 4 mmol/L (ref 3.5–5.1)
SODIUM: 137 mmol/L (ref 135–145)

## 2017-04-18 MED ORDER — VANCOMYCIN HCL 10 G IV SOLR
1750.0000 mg | INTRAVENOUS | Status: DC
  Filled 2017-04-18: qty 1750

## 2017-04-18 MED ORDER — TRAMADOL HCL 50 MG PO TABS: 50.0000 mg | ORAL_TABLET | Freq: Four times a day (QID) | ORAL | 1 refills | Status: AC | PRN

## 2017-04-18 MED ORDER — FLEET ENEMA 7-19 GM/118ML RE ENEM: 1.0000 | ENEMA | Freq: Once | RECTAL | Status: DC

## 2017-04-18 MED ORDER — FLEET ENEMA 7-19 GM/118ML RE ENEM
1.0000 | ENEMA | RECTAL | Status: AC
  Administered 2017-04-18: 1 via RECTAL

## 2017-04-18 MED ORDER — VANCOMYCIN IV (FOR PTA / DISCHARGE USE ONLY): 1750.0000 mg | INTRAVENOUS | 0 refills | Status: AC

## 2017-04-18 MED ORDER — SENNOSIDES-DOCUSATE SODIUM 8.6-50 MG PO TABS
2.0000 | ORAL_TABLET | Freq: Two times a day (BID) | ORAL | Status: DC
  Administered 2017-04-18: 2 via ORAL
  Filled 2017-04-18: qty 2

## 2017-04-18 MED ORDER — PREMIER PROTEIN SHAKE: 11.0000 [oz_av] | Freq: Two times a day (BID) | ORAL | 0 refills | Status: AC

## 2017-04-18 MED ORDER — VANCOMYCIN IV (FOR PTA / DISCHARGE USE ONLY): 1750.0000 mg | INTRAVENOUS | 0 refills | Status: DC

## 2017-04-18 NOTE — Care Management Important Message (Signed)
Important Message  Patient Details  Name: Susan Pratt MRN: [id number] Date of Birth: 06/14/1940   Medicare Important Message Given:  Yes    Katrina Stack, RN 04/18/2017, 2:02 PM

## 2017-04-18 NOTE — Care Management Note (Signed)
Case Management Note  Patient Details  Name: Susan Pratt MRN: [id number] Date of Birth: 1940/07/23  Subjective/Objective:                 Patient for discharge home today with home IV antibiotics which will be provided by [facility].  Vancomycin is currently ordered for q 24 hours.  Dose given this morning at 8 AM. Patient says her husband will learn to administer the medications. Agency preference for home health discipline visits  is [facility], who informed CM can not accept the referral due to scheduling issues. Patient verbalizes disappointment that can not  be seen as agency has provided service on previous occasion.  Agreeable to have [facility] to also provide the home visits.  Will travel home by ems and husband is at home to receive her. Husband verbalizes agreement to receive instruction on IV administration and he is also in agreement for [facility] to provide nurse and therapy services.  Notified primary nurse that ems can be called   Action/Plan:   Expected Discharge Date:  04/18/17               Expected Discharge Plan:     In-House Referral:     Discharge planning Services  CM Consult  Post Acute Care Choice:    Choice offered to:  Patient  DME Arranged:    DME Agency:     HH Arranged:  RN, PT, OT, Nurse's Aide, IV Antibiotics, Social Work [employer] Agency:  [place]  Status of Service:  Completed, signed off  If discussed at [employer], dates discussed:    Additional Comments:  Katrina Stack, RN 04/18/2017, 2:21 PM

## 2017-04-18 NOTE — Discharge Instructions (Signed)
Bacteremia °Bacteremia is the presence of bacteria in the blood. When bacteria enter the bloodstream, they can cause a life-threatening reaction called sepsis, which is a medical emergency. Bacteremia can spread to other parts of the body, including the heart, joints, and brain. °What are the causes? °This condition is caused by bacteria that get into the blood. Bacteria can enter the blood: °· From a skin infection or a cut on your skin. °· During an episode of pneumonia. °· From an infection in your stomach or intestine (gastrointestinal infection). °· From an infection in your bladder or urinary system (urinary tract infection). °· During a dental or medical procedure. °· After you brush your teeth so hard that your gums bleed. °· When a bacterial infection in another part of the body spreads to the blood. °· Through a dirty needle. ° °What increases the risk? °This condition is more likely to develop in: °· Children. °· Elderly adults. °· People who have a long-lasting (chronic) disease or medical condition. °· People who have an artificial joint or heart valve. °· People who have heart valve disease. °· People who have a tube, such as a catheter or IV tube, that has been inserted for a medical treatment. °· People who have a weak body defense system (immune system). °· People who use IV drugs. ° °What are the signs or symptoms? °Symptoms of this condition include: °· Fever. °· Chills. °· A racing heart. °· Shortness of breath. °· Dizziness. °· Weakness. °· Confusion. °· Nausea or vomiting. °· Diarrhea. ° °In some cases, there are no symptoms. Bacteremia that has spread to the other parts of the body may cause symptoms in those areas. °How is this diagnosed? °This condition may be diagnosed with a physical exam and tests, such as: °· A complete blood count (CBC). This test looks for signs of infection. °· Blood cultures. These look for bacteria in your blood. °· Tests of any tubes that you may have inserted into  your body, such as an IV tube or urinary catheter. These tests look for a source of infection. °· Urine tests including urine cultures. These look for bacteria in the urine that could be a source of infection. °· Imaging tests, such as an X-ray, CT scan, MRI, or heart ultrasound. These look for a source of infection in other parts of the body, such as the lungs, heart valves, or joints. ° °How is this treated? °This condition may be treated with: °· Antibiotic medicines given through an IV infusion. Depending on the source of infection, antibiotics may be needed for several weeks. At first, an antibiotic may be given to kill most types of blood bacteria. If your test results show that a certain kind of bacteria is causing problems, the antibiotic may be changed to kill only the bacteria that are causing problems. °· Antibiotics taken by mouth. °· IV fluids to support the body as you fight the infection. °· Removing any catheter or device that could be a source of infection. °· Blood pressure and breathing support, if you have sepsis. °· Surgery to control the source or spread of infection, such as: °? Removing an infected implanted device. °? Removing infected tissue or an abscess. ° °This condition is usually treated at a hospital. If you are treated at home, you may need to come back for medicines, blood tests, and evaluation. This is important. °Follow these instructions at home: °· Take over-the-counter and prescription medicines only as told by your health care provider. °·   If you were prescribed an antibiotic, take it as told by your health care provider. Do not stop taking the antibiotic even if you start to feel better. °· Rest until your condition is under control. °· Drink enough fluid to keep your urine clear or pale yellow. °· Do not smoke. If you need help quitting, ask your health care provider. °· Keep all follow-up visits as told by your health care provider. This is important. °How is this  prevented? °· Get the vaccinations that your health care provider recommends. °· Clean and cover any scrapes or cuts. °· Take good care of your skin. This includes regular bathing and moisturizing. °· Wash your hands often. °· Practice good oral hygiene. Brush your teeth two times a day and floss regularly. °Get help right away if: °· You have pain. °· You have a fever. °· You have trouble breathing. °· Your skin becomes blotchy, pale, or clammy. °· You develop confusion, dizziness, or weakness. °· You develop diarrhea. °· You develop any new symptoms after treatment. °Summary °· Bacteremia is the presence of bacteria in the blood. When bacteria enter the bloodstream, they can cause a life- threatening reaction called sepsis. °· Children and elderly adults are at increased risk of bacteremia. Other risk factors include having a long-lasting (chronic) disease or a weak immune system, having an artificial joint or heart valve, having heart valve disease, having tubes that were inserted in the body for medical treatment, or using IV drugs. °· Some symptoms of bacteremia include fever, chills, shortness of breath, confusion, nausea or vomiting, and diarrhea. °· Tests may be done to diagnose a source of infection that led to bacteremia. These tests may include blood tests, urine tests, and imaging tests. °· Bacteremia is usually treated with antibiotics, usually in a hospital. °This information is not intended to replace advice given to you by your health care provider. Make sure you discuss any questions you have with your health care provider. °Document Released: 02/28/2006 Document Revised: 04/13/2016 Document Reviewed: 04/13/2016 °Elsevier Interactive Patient Education © 2018 Elsevier Inc[employer] ° °

## 2017-04-18 NOTE — Progress Notes (Addendum)
Pharmacy Antibiotic Note  Susan Pratt is a 76 y.o. female admitted on 04/10/2017 with MRSA bacteremia.  Pharmacy has been consulted for vancomycin dosing.  Plan: Trough is at goal on 1250 mg iv q 18 hours. However, will adjust regimen to make dosing amenable for SNF/home health administration as patient will need to continue IV abx for several weeks. Will begin vancomycin 1750 mg iv q 24 hours tomorrow and plan on checking a level with the 4th or 5th dose depending on renal function and discharge date.   Height: 5\' 10"  (177.8 cm) Weight: 260 lb (117.9 kg) IBW/kg (Calculated) : 68.5  Temp (24hrs), Avg:98.5 F (36.9 C), Min:98.2 F (36.8 C), Max:98.6 F (37 C)  Recent Labs  Lab 04/12/17 1014 04/12/17 1510 04/13/17 0315 04/13/17 0323 04/14/17 0742 04/15/17 0440 04/15/17 0817 04/16/17 0342 04/18/17 0630 04/18/17 0700  WBC 27.4*  --   --  22.9*  --  19.8*  --  21.7* 18.8*  --   CREATININE 1.13*  --   --  1.09* 0.70 0.70  --   --  0.72  --   VANCOTROUGH  --  28*  --   --   --   --   --   --   --  19  VANCORANDOM  --   --  17  --   --   --  9  --   --   --     Estimated Creatinine Clearance: 82.1 mL/min (by C-G formula based on SCr of 0.72 mg/dL).    Allergies  Allergen Reactions  . Lisinopril Swelling    Causes tongue swelling    Antimicrobials this admission: Zosyn 11/11 >> 11/12 Vancomycin 11/11 >>   Microbiology results: 11/11 BCx: MRSA 11/11 UCx: E coli (per ID likely contaminant) 11/12 MRSA PCR: Positive  Thank you for allowing pharmacy to be a part of this patient's care.  Ulice Dash, PharmD Clinical Pharmacist  04/18/2017 10:14 AM

## 2017-04-18 NOTE — Discharge Planning (Addendum)
IV removed. RN assessment and VS revealed stability for DC to home with [facility].  Discharge papers given, explained and educated.  Informed of suggested FU appts and appts made. Scripts printed, signed and given. PICC left in place for IV anbx at home (per orders).  Given pain meds just prior to discharge. EMS transporting home.

## 2017-04-19 ENCOUNTER — Telehealth: Payer: Self-pay

## 2017-04-19 NOTE — Discharge Summary (Addendum)
[facility] [place] NAME: Susan Pratt    MR#:  [id number]  DATE OF BIRTH:  1940-09-04  DATE OF ADMISSION:  04/10/2017   ADMITTING PHYSICIAN: Baxter Hire, MD  DATE OF DISCHARGE: 04/18/2017  5:25 PM  PRIMARY CARE PHYSICIAN: Adline Potter, MD   ADMISSION DIAGNOSIS:  Sepsis, due to unspecified organism ([place]) [A41.9] DISCHARGE DIAGNOSIS:  Active Problems:   Sepsis ([place])   Pressure injury of skin  SECONDARY DIAGNOSIS:   Past Medical History:  Diagnosis Date  . Arthritis    ra  . Bronchospastic airway disease   . Cancer (HCC)    cervical  . Cataract   . Diabetes mellitus without complication ([place])   . 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 Hyperlipidemia   . Hypertension   . 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 Neuropathy    peripheral  . PE (pulmonary embolism)    2015   HOSPITAL COURSE:  76 year old female with past medical history of arthritis, diabetes, hypertension comes to the emergency room with altered mental status and fever.  She has been complaining of chronic back pain and left hip pain. She had a steroid injection a left hip last Thursday as an outpatient.  1. MRSA bacteremia-this is the source of patient's sepsis. 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 Cannot do MRI of the thoracic/lumbar sinus patient has a spinal stimulator. - Patient and husband refused skilled nursing facility, patient will be going home with home health registered nurse, home health aide.   2. Acute Metabolic encephalopathy. Likely secondary to the sepsis. - improved  3. Back pain-this is secondary to patient's severe discitis and also previous back surgeries.  pain mgmt , 4. Left hip bursitis. As noted above she had a steroid injection on Thursday. There is some bruising around the site. CT scan of the abdomen got some cuts down that area and do  not see any obvious abscess.  pt was seen by orthopedic and no evidence of Hip infection.    5. Chronic pain-  continue her pain meds  6. Hypokalemia- improved w/ supplementation.   7. deconditioning: Physical therapy recommends skilled nursing.  Patient has global weakness, deconditioning throughout all extremities, patient is at home almost in the bed ,all the time and taken care by husband. (AS PER HER) Patient benefits from skilled physical therapy, skilled nursing as per physical therapy recommendation, patient and her husband refused skilled nursing... DISCHARGE CONDITIONS:  stable CONSULTS OBTAINED:  Treatment Team:  Leim Fabry, MD Leonel Ramsay, MD Flora Lipps, MD DRUG ALLERGIES:   Allergies  Allergen Reactions  . Lisinopril Swelling    Causes tongue swelling   DISCHARGE MEDICATIONS:   Allergies as of 04/18/2017      Reactions   Lisinopril Swelling   Causes tongue swelling      Medication List    STOP taking these medications   oxyCODONE 5 MG immediate release tablet Commonly known as:  Oxy IR/ROXICODONE   sulfamethoxazole-trimethoprim 800-160 MG tablet Commonly known as:  BACTRIM DS,SEPTRA DS     TAKE these medications   amLODipine 10 MG tablet Commonly known as:  NORVASC Take 1 tablet (10 mg total) by mouth daily.   aspirin EC 81 MG tablet Take 81 mg by mouth daily.   atenolol 50 MG tablet Commonly known as:  TENORMIN Take by mouth.  BAYER CONTOUR NEXT TEST test strip Generic drug:  glucose blood   glucose blood test strip Use as instructed   cholecalciferol 1000 units tablet Commonly known as:  VITAMIN D Take 1,000 Units by mouth daily.   gabapentin 300 MG capsule Commonly known as:  NEURONTIN Take 2 capsules 4 (four) times daily by mouth.   meloxicam 15 MG tablet Commonly known as:  MOBIC TAKE 1 TABLEY BY MOUTH ONCE DAILY AS NEEDED FOR PAIN   metFORMIN 500 MG tablet Commonly known as:  GLUCOPHAGE Take 1 tablet (500 mg  total) by mouth 2 (two) times daily with a meal.   multivitamin capsule Take 1 capsule by mouth daily.   oxybutynin 5 MG tablet Commonly known as:  DITROPAN Take 5 mg by mouth.   PROAIR HFA 108 (90 Base) MCG/ACT inhaler Generic drug:  albuterol Inhale into the lungs.   protein supplement shake Liqd Commonly known as:  PREMIER PROTEIN Take 325 mLs (11 oz total) 2 (two) times daily between meals by mouth.   traMADol 50 MG tablet Commonly known as:  ULTRAM Take 1 tablet (50 mg total) every 6 (six) hours as needed by mouth for moderate pain. What changed:  how much to take   vancomycin IVPB Inject 1,750 mg daily into the vein. Indication:  Thoracic spine discitis Last Day of Therapy:  05/23/17 Labs - Sunday/Monday:  CBC/D, BMP, and vancomycin trough. Labs - Thursday:  BMP and vancomycin trough Labs - Every other week:  ESR and CRP   vitamin B-12 1000 MCG tablet Commonly known as:  CYANOCOBALAMIN Take 1,000 mcg by mouth daily.            Home Infusion Instuctions  (From admission, onward)        Start     Ordered   04/18/17 0000  Home infusion instructions [facility] May follow [facility] Dosing Protocol; May administer Cathflo as needed to maintain patency of vascular access device.; Flushing of vascular access device: per [facility] Protocol: 0.9% NaCl pre/post medica...    Question Answer Comment  Instructions May follow [facility] Dosing Protocol   Instructions May administer Cathflo as needed to maintain patency of vascular access device.   Instructions Flushing of vascular access device: per [facility] Protocol: 0.9% NaCl pre/post medication administration and prn patency; Heparin 100 u/ml, 5ml for implanted ports and Heparin 10u/ml, 5ml for all other central venous catheters.   Instructions May follow [facility] Anaphylaxis Protocol for First Dose Administration in the home: 0.9% NaCl at 25-50 ml/hr to maintain IV access for protocol meds. Epinephrine 0.3 ml IV/IM PRN and  Benadryl 25-50 IV/IM PRN s/s of anaphylaxis.   Instructions [facility] Infusion Coordinator (RN) to assist per patient IV care needs in the home PRN.      04/18/17 1323       DISCHARGE INSTRUCTIONS:  Vancomycin     12 50               mg  every    18           hours .     Goal vancomycin trough 15-20.    Pharmacy to adjust dosing based on levels  PICC Care per protocol Labs weekly while on IV antibiotics -FAX weekly labs to [phone] CBC w diff         Comprehensive met panel Vancomycin Trough     CRP       Planned duration of antibiotics 6 weeks   Stop  date        05/23/17          Follow up clinic datein 2-3 weeks       DIET:  Regular diet DISCHARGE CONDITION:  Good ACTIVITY:  Activity as tolerated OXYGEN:  Home Oxygen: No.  Oxygen Delivery: room air DISCHARGE LOCATION:  home with RN, PT, OT, Nurse's Aide, IV Antibiotics, Social Work    If you experience worsening of your admission symptoms, develop shortness of breath, life threatening emergency, suicidal or homicidal thoughts you must seek medical attention immediately by calling 911 or calling your MD immediately  if symptoms less severe.  You Must read complete instructions/literature along with all the possible adverse reactions/side effects for all the Medicines you take and that have been prescribed to you. Take any new Medicines after you have completely understood and accpet all the possible adverse reactions/side effects.   Please note  You were cared for by a hospitalist during your hospital stay. If you have any questions about your discharge medications or the care you received while you were in the hospital after you are discharged, you can call the unit and asked to speak with the hospitalist on call if the hospitalist that took care of you is not available. Once you are discharged, your primary care physician will handle any further medical issues. Please note that NO REFILLS for any  discharge medications will be authorized once you are discharged, as it is imperative that you return to your primary care physician (or establish a relationship with a primary care physician if you do not have one) for your aftercare needs so that they can reassess your need for medications and monitor your lab values.    On the day of Discharge:  VITAL SIGNS:  Blood pressure (!) 163/76, pulse (!) 101, temperature 98.5 F (36.9 C), temperature source Oral, resp. rate 17, height 5' 10"  (1.778 m), weight 117.9 kg (260 lb), SpO2 93 %. PHYSICAL EXAMINATION:  GENERAL:  76 y.o.-year-old patient lying in the bed with no acute distress.  EYES: Pupils equal, round, reactive to light and accommodation. No scleral icterus. Extraocular muscles intact.  HEENT: Head atraumatic, normocephalic. Oropharynx and nasopharynx clear.  NECK:  Supple, no jugular venous distention. No thyroid enlargement, no tenderness.  LUNGS: Normal breath sounds bilaterally, no wheezing, rales,rhonchi or crepitation. No use of accessory muscles of respiration.  CARDIOVASCULAR: S1, S2 normal. No murmurs, rubs, or gallops.  ABDOMEN: Soft, non-tender, non-distended. Bowel sounds present. No organomegaly or mass.  EXTREMITIES: No pedal edema, cyanosis, or clubbing.  NEUROLOGIC: Cranial nerves II through XII are intact. Muscle strength 5/5 in all extremities. Sensation intact. Gait not checked.  PSYCHIATRIC: The patient is alert and oriented x 3.  SKIN: No obvious rash, lesion, or ulcer.  DATA REVIEW:   CBC Recent Labs  Lab 04/18/17 0630  WBC 18.8*  HGB 12.3  HCT 37.2  PLT 564*    Chemistries  Recent Labs  Lab 04/16/17 0342 04/18/17 0630  NA  --  137  K  --  4.0  CL  --  96*  CO2  --  30  GLUCOSE  --  146*  BUN  --  33*  CREATININE  --  0.72  CALCIUM  --  9.1  MG 1.9  --      Microbiology Results  Results for orders placed or performed during the hospital encounter of 04/10/17  Urine culture     Status:  Abnormal   Collection Time:  04/10/17  6:40 AM  Result Value Ref Range Status   Specimen Description URINE, RANDOM  Final   Special Requests NONE  Final   Culture >=100,000 COLONIES/mL ESCHERICHIA COLI (A)  Final   Report Status 04/12/2017 FINAL  Final   Organism ID, Bacteria ESCHERICHIA COLI (A)  Final      Susceptibility   Escherichia coli - MIC*    AMPICILLIN <=2 SENSITIVE Sensitive     CEFAZOLIN <=4 SENSITIVE Sensitive     CEFTRIAXONE <=1 SENSITIVE Sensitive     CIPROFLOXACIN <=0.25 SENSITIVE Sensitive     GENTAMICIN <=1 SENSITIVE Sensitive     IMIPENEM <=0.25 SENSITIVE Sensitive     NITROFURANTOIN <=16 SENSITIVE Sensitive     TRIMETH/SULFA <=20 SENSITIVE Sensitive     AMPICILLIN/SULBACTAM <=2 SENSITIVE Sensitive     PIP/TAZO <=4 SENSITIVE Sensitive     Extended ESBL NEGATIVE Sensitive     * >=100,000 COLONIES/mL ESCHERICHIA COLI  Culture, blood (Routine x 2)     Status: Abnormal   Collection Time: 04/10/17  6:48 AM  Result Value Ref Range Status   Specimen Description BLOOD RIGHT ANTECUBITAL  Final   Special Requests   Final    BOTTLES DRAWN AEROBIC AND ANAEROBIC Blood Culture adequate volume   Culture  Setup Time   Final    GRAM POSITIVE COCCI IN BOTH AEROBIC AND ANAEROBIC BOTTLES CRITICAL RESULT CALLED TO, READ BACK BY AND VERIFIED WITH: JASON ROBBINS AT 2122 04/10/17.PMH    Culture (A)  Final    STAPHYLOCOCCUS AUREUS SUSCEPTIBILITIES PERFORMED ON PREVIOUS CULTURE WITHIN THE LAST 5 DAYS. Performed at [facility], [place] [address] [place], [place] [address]    Report Status 04/13/2017 FINAL  Final  Blood Culture ID Panel (Reflexed)     Status: Abnormal   Collection Time: 04/10/17  6:48 AM  Result Value Ref Range Status   Enterococcus species NOT DETECTED NOT DETECTED Final   Listeria monocytogenes NOT DETECTED NOT DETECTED Final   Staphylococcus species DETECTED (A) NOT DETECTED Final    Comment: CRITICAL RESULT CALLED TO, READ BACK BY AND VERIFIED WITH: JASON  ROBBINS AT 2122 04/10/17.PMH    Staphylococcus aureus DETECTED (A) NOT DETECTED Final    Comment: Methicillin (oxacillin)-resistant Staphylococcus aureus (MRSA). MRSA is predictably resistant to beta-lactam antibiotics (except ceftaroline). Preferred therapy is vancomycin unless clinically contraindicated. Patient requires contact precautions if  hospitalized. CRITICAL RESULT CALLED TO, READ BACK BY AND VERIFIED WITH: JASON ROBBINS AT 2122 04/10/17.PMH    Methicillin resistance DETECTED (A) NOT DETECTED Final    Comment: CRITICAL RESULT CALLED TO, READ BACK BY AND VERIFIED WITH: JASON ROBBINS AT 2122 04/10/17.PMH    Streptococcus species NOT DETECTED NOT DETECTED Final   Streptococcus agalactiae NOT DETECTED NOT DETECTED Final   Streptococcus pneumoniae NOT DETECTED NOT DETECTED Final   Streptococcus pyogenes NOT DETECTED NOT DETECTED Final   Acinetobacter baumannii NOT DETECTED NOT DETECTED Final   Enterobacteriaceae species NOT DETECTED NOT DETECTED Final   Enterobacter cloacae complex NOT DETECTED NOT DETECTED Final   Escherichia coli NOT DETECTED NOT DETECTED Final   Klebsiella oxytoca NOT DETECTED NOT DETECTED Final   Klebsiella pneumoniae NOT DETECTED NOT DETECTED Final   Proteus species NOT DETECTED NOT DETECTED Final   Serratia marcescens NOT DETECTED NOT DETECTED Final   Haemophilus influenzae NOT DETECTED NOT DETECTED Final   Neisseria meningitidis NOT DETECTED NOT DETECTED Final   Pseudomonas aeruginosa NOT DETECTED NOT DETECTED Final   Candida albicans NOT DETECTED NOT DETECTED Final  Candida glabrata NOT DETECTED NOT DETECTED Final   Candida krusei NOT DETECTED NOT DETECTED Final   Candida parapsilosis NOT DETECTED NOT DETECTED Final   Candida tropicalis NOT DETECTED NOT DETECTED Final  Culture, blood (Routine x 2)     Status: Abnormal   Collection Time: 04/10/17  7:38 AM  Result Value Ref Range Status   Specimen Description BLOOD RIGHT ASSIST CONTROL  Final    Special Requests   Final    BOTTLES DRAWN AEROBIC AND ANAEROBIC Blood Culture adequate volume   Culture  Setup Time   Final    GRAM POSITIVE COCCI IN BOTH AEROBIC AND ANAEROBIC BOTTLES CRITICAL RESULT CALLED TO, READ BACK BY AND VERIFIED WITH: JASON ROBBINS AT 2122 04/10/17.PMH    Culture METHICILLIN RESISTANT STAPHYLOCOCCUS AUREUS (A)  Final   Report Status 04/13/2017 FINAL  Final   Organism ID, Bacteria METHICILLIN RESISTANT STAPHYLOCOCCUS AUREUS  Final      Susceptibility   Methicillin resistant staphylococcus aureus - MIC*    CIPROFLOXACIN >=8 RESISTANT Resistant     ERYTHROMYCIN >=8 RESISTANT Resistant     GENTAMICIN <=0.5 SENSITIVE Sensitive     OXACILLIN >=4 RESISTANT Resistant     TETRACYCLINE <=1 SENSITIVE Sensitive     VANCOMYCIN 1 SENSITIVE Sensitive     TRIMETH/SULFA <=10 SENSITIVE Sensitive     CLINDAMYCIN <=0.25 SENSITIVE Sensitive     RIFAMPIN <=0.5 SENSITIVE Sensitive     Inducible Clindamycin NEGATIVE Sensitive     * METHICILLIN RESISTANT STAPHYLOCOCCUS AUREUS  MRSA PCR Screening     Status: Abnormal   Collection Time: 04/11/17  9:40 AM  Result Value Ref Range Status   MRSA by PCR POSITIVE (A) NEGATIVE Final    Comment:        The GeneXpert MRSA Assay (FDA approved for NASAL specimens only), is one component of a comprehensive MRSA colonization surveillance program. It is not intended to diagnose MRSA infection nor to guide or monitor treatment for MRSA infections. RESULT CALLED TO, READ BACK BY AND VERIFIED WITH: BRANDY MANSFIELD @ 1108 04/11/17 [place]   Culture, blood (single) w Reflex to ID Panel     Status: None   Collection Time: 04/11/17  9:09 PM  Result Value Ref Range Status   Specimen Description BLOOD LEFT ARM  Final   Special Requests   Final    BOTTLES DRAWN AEROBIC AND ANAEROBIC Blood Culture adequate volume   Culture NO GROWTH 5 DAYS  Final   Report Status 04/16/2017 FINAL  Final  Aerobic Culture (superficial specimen)     Status: None    Collection Time: 04/12/17  2:20 PM  Result Value Ref Range Status   Specimen Description LEG WOUND  Final   Special Requests Immunocompromised  Final   Gram Stain   Final    RARE WBC PRESENT,BOTH PMN AND MONONUCLEAR RARE GRAM POSITIVE COCCI Performed at [facility], [place] [address], [place], [place] [address]    Culture FEW METHICILLIN RESISTANT STAPHYLOCOCCUS AUREUS  Final   Report Status 04/16/2017 FINAL  Final   Organism ID, Bacteria METHICILLIN RESISTANT STAPHYLOCOCCUS AUREUS  Final      Susceptibility   Methicillin resistant staphylococcus aureus - MIC*    CIPROFLOXACIN >=8 RESISTANT Resistant     ERYTHROMYCIN >=8 RESISTANT Resistant     GENTAMICIN <=0.5 SENSITIVE Sensitive     OXACILLIN >=4 RESISTANT Resistant     TETRACYCLINE <=1 SENSITIVE Sensitive     VANCOMYCIN 1 SENSITIVE Sensitive     TRIMETH/SULFA <=10  SENSITIVE Sensitive     CLINDAMYCIN <=0.25 SENSITIVE Sensitive     RIFAMPIN <=0.5 SENSITIVE Sensitive     Inducible Clindamycin NEGATIVE Sensitive     * FEW METHICILLIN RESISTANT STAPHYLOCOCCUS AUREUS    Follow-up Information    Adline Potter, MD. Go on 04/27/2017.   Specialties:  Family Medicine, Geriatric Medicine Why:  Wednesday at 11:00am for hospital follow-up Contact information: [address] [place] [address] [phone]        Leonel Ramsay, MD. Go on 05/09/2017.   Specialty:  Infectious Diseases Why:  Go on 05/09/17 at 8:30am. Contact information: [place] [address] [phone]        Anabel Bene, MD. Go on 05/03/2017.   Specialty:  Neurology Why:  Tuesday at 8:45am  Contact information: [place] [facility] [place] [address] [phone]          High risk for readmission  Management plans discussed with the patient, family and they are in agreement.  CODE STATUS: Prior   TOTAL TIME TAKING CARE OF THIS PATIENT: 45 minutes.     Max Sane M.D on 04/19/2017 at 3:21 PM  Between 7am to 6pm - Pager - [phone]  After 6pm go to [email or web address] - Proofreader  [facility]  Office  [phone]  CC: Primary care physician; Adline Potter, MD   Note: This dictation was prepared with Dragon dictation along with smaller phrase technology. Any transcriptional errors that result from this process are unintentional.

## 2017-04-19 NOTE — Telephone Encounter (Signed)
Transition Care Management Follow-Up Telephone Call   Date discharged and where: 04/18/17 from [facility]  How have you been since you were released from the hospital? States she is starting to feel a little better. Confirmed [facility] has been assisting her with her PICC and IV ATB's.  Any patient concerns? States she no longer sees Dr. Vicente Masson. Further states her follow up care has been assumed by a Dr. Salena Saner. Pt disconnected phone call before I could inquire any further  Items Reviewed:   Meds: Verified   Allergies: Verified  Dietary Changes Reviewed: Pt disconnected phone call before I could inquire any further   Functional Questionnaire:  Independent-I Dependent-D  ADLs:   Dressing- Pt disconnected phone call before I could inquire any further    Eating- Pt disconnected phone call before I could inquire any further   Maintaining continence- Pt disconnected phone call before I could inquire any further   Transferring- Pt disconnected phone call before I could inquire any further    Transportation- Pt disconnected phone call before I could inquire any further   Meal Prep- Pt disconnected phone call before I could inquire any further   Managing Meds- Pt disconnected phone call before I could inquire any further  Confirmed importance and Date/Time of follow-up visits scheduled: Informed pt of her f/u appt with Dr. Vicente Masson on 04/01/17. States she no longer sees Dr. Vicente Masson and asked me to cancel the appt. Further states that her follow up care has been assumed by a Dr. Salena Saner. Attempted to inquire further about Dr. Salena Saner (ie: location, specialty, f/u appt, etc.) but pt disconnected phone call.   Confirmed with patient if condition worsens to call PCP or go to the Emergency Dept. Patient was given office number and encouraged to call back with questions or concerns: Pt disconnected phone call before I could inform her to proceed to ED if symptoms worsen.

## 2017-04-20 ENCOUNTER — Emergency Department: Payer: Medicare Other

## 2017-04-20 ENCOUNTER — Emergency Department
Admission: EM | Admit: 2017-04-20 | Discharge: 2017-04-20 | Disposition: A | Discharge status: Other Acute Inpatient Hospital | Payer: Medicare Other | Attending: Emergency Medicine | Admitting: Emergency Medicine

## 2017-04-20 ENCOUNTER — Inpatient Hospital Stay: Admit: 2017-04-20 | Payer: Self-pay | Admitting: Neurosurgery

## 2017-04-20 ENCOUNTER — Ambulatory Visit (HOSPITAL_COMMUNITY)
Admission: AD | Admit: 2017-04-20 | Discharge: 2017-04-20 | Disposition: A | Discharge status: Home / Self Care | Payer: Medicare Other | Source: Other Acute Inpatient Hospital | Attending: Neurosurgery | Admitting: Neurosurgery

## 2017-04-20 DIAGNOSIS — M4624 Osteomyelitis of vertebra, thoracic region: Secondary | ICD-10-CM | POA: Diagnosis not present

## 2017-04-20 DIAGNOSIS — Z96612 Presence of left artificial shoulder joint: Secondary | ICD-10-CM | POA: Diagnosis not present

## 2017-04-20 DIAGNOSIS — M4644 Discitis, unspecified, thoracic region: Secondary | ICD-10-CM | POA: Diagnosis not present

## 2017-04-20 DIAGNOSIS — Z86711 Personal history of pulmonary embolism: Secondary | ICD-10-CM | POA: Insufficient documentation

## 2017-04-20 DIAGNOSIS — E119 Type 2 diabetes mellitus without complications: Secondary | ICD-10-CM | POA: Diagnosis not present

## 2017-04-20 DIAGNOSIS — S22078A Other fracture of T9-T10 vertebra, initial encounter for closed fracture: Secondary | ICD-10-CM | POA: Insufficient documentation

## 2017-04-20 DIAGNOSIS — Z7984 Long term (current) use of oral hypoglycemic drugs: Secondary | ICD-10-CM | POA: Diagnosis not present

## 2017-04-20 DIAGNOSIS — Y999 Unspecified external cause status: Secondary | ICD-10-CM | POA: Diagnosis not present

## 2017-04-20 DIAGNOSIS — M069 Rheumatoid arthritis, unspecified: Secondary | ICD-10-CM | POA: Insufficient documentation

## 2017-04-20 DIAGNOSIS — Y929 Unspecified place or not applicable: Secondary | ICD-10-CM | POA: Diagnosis not present

## 2017-04-20 DIAGNOSIS — Z7982 Long term (current) use of aspirin: Secondary | ICD-10-CM | POA: Insufficient documentation

## 2017-04-20 DIAGNOSIS — M549 Dorsalgia, unspecified: Secondary | ICD-10-CM | POA: Diagnosis present

## 2017-04-20 DIAGNOSIS — E1140 Type 2 diabetes mellitus with diabetic neuropathy, unspecified: Secondary | ICD-10-CM | POA: Insufficient documentation

## 2017-04-20 DIAGNOSIS — X58XXXA Exposure to other specified factors, initial encounter: Secondary | ICD-10-CM | POA: Diagnosis not present

## 2017-04-20 DIAGNOSIS — M869 Osteomyelitis, unspecified: Secondary | ICD-10-CM | POA: Insufficient documentation

## 2017-04-20 DIAGNOSIS — I10 Essential (primary) hypertension: Secondary | ICD-10-CM | POA: Insufficient documentation

## 2017-04-20 DIAGNOSIS — Y939 Activity, unspecified: Secondary | ICD-10-CM | POA: Diagnosis not present

## 2017-04-20 DIAGNOSIS — Z8541 Personal history of malignant neoplasm of cervix uteri: Secondary | ICD-10-CM | POA: Insufficient documentation

## 2017-04-20 LAB — CBC WITH DIFFERENTIAL/PLATELET
Basophils Absolute: 0.1 10*3/uL (ref 0–0.1)
Basophils Relative: 1 %
Eosinophils Absolute: 0.2 10*3/uL (ref 0–0.7)
Eosinophils Relative: 1 %
HEMATOCRIT: 38.3 % (ref 35.0–47.0)
HEMOGLOBIN: 12.6 g/dL (ref 12.0–16.0)
LYMPHS ABS: 2 10*3/uL (ref 1.0–3.6)
LYMPHS PCT: 11 %
MCH: 30.7 pg (ref 26.0–34.0)
MCHC: 33 g/dL (ref 32.0–36.0)
MCV: 93 fL (ref 80.0–100.0)
MONO ABS: 0.9 10*3/uL (ref 0.2–0.9)
MONOS PCT: 5 %
NEUTROS ABS: 14.7 10*3/uL — AB (ref 1.4–6.5)
NEUTROS PCT: 82 %
Platelets: 598 10*3/uL — ABNORMAL HIGH (ref 150–440)
RBC: 4.11 MIL/uL (ref 3.80–5.20)
RDW: 15.3 % — ABNORMAL HIGH (ref 11.5–14.5)
WBC: 18 10*3/uL — ABNORMAL HIGH (ref 3.6–11.0)

## 2017-04-20 LAB — COMPREHENSIVE METABOLIC PANEL
ALBUMIN: 2.4 g/dL — AB (ref 3.5–5.0)
ALK PHOS: 102 U/L (ref 38–126)
ALT: 31 U/L (ref 14–54)
ANION GAP: 11 (ref 5–15)
AST: 20 U/L (ref 15–41)
BILIRUBIN TOTAL: 1.4 mg/dL — AB (ref 0.3–1.2)
BUN: 21 mg/dL — ABNORMAL HIGH (ref 6–20)
CALCIUM: 8.8 mg/dL — AB (ref 8.9–10.3)
CO2: 28 mmol/L (ref 22–32)
Chloride: 100 mmol/L — ABNORMAL LOW (ref 101–111)
Creatinine, Ser: 0.77 mg/dL (ref 0.44–1.00)
GLUCOSE: 157 mg/dL — AB (ref 65–99)
Potassium: 3.6 mmol/L (ref 3.5–5.1)
Sodium: 139 mmol/L (ref 135–145)
TOTAL PROTEIN: 6.4 g/dL — AB (ref 6.5–8.1)

## 2017-04-20 LAB — URINALYSIS, COMPLETE (UACMP) WITH MICROSCOPIC
BACTERIA UA: NONE SEEN
Bilirubin Urine: NEGATIVE
Glucose, UA: NEGATIVE mg/dL
KETONES UR: 5 mg/dL — AB
NITRITE: NEGATIVE
PH: 7 (ref 5.0–8.0)
PROTEIN: NEGATIVE mg/dL
Specific Gravity, Urine: 1.01 (ref 1.005–1.030)

## 2017-04-20 LAB — LACTIC ACID, PLASMA: Lactic Acid, Venous: 1.2 mmol/L (ref 0.5–1.9)

## 2017-04-20 LAB — TROPONIN I

## 2017-04-20 MED ORDER — IOPAMIDOL (ISOVUE-300) INJECTION 61%
75.0000 mL | Freq: Once | INTRAVENOUS | Status: AC | PRN
Start: 2017-04-20 — End: 2017-04-20
  Administered 2017-04-20: 75 mL via INTRAVENOUS

## 2017-04-20 MED ORDER — FENTANYL CITRATE (PF) 100 MCG/2ML IJ SOLN
50.0000 ug | Freq: Once | INTRAMUSCULAR | Status: AC
  Administered 2017-04-20: 50 ug via INTRAVENOUS
  Filled 2017-04-20: qty 2

## 2017-04-20 MED ORDER — GABAPENTIN 600 MG PO TABS
600.0000 mg | ORAL_TABLET | Freq: Once | ORAL | Status: AC
  Administered 2017-04-20: 600 mg via ORAL

## 2017-04-20 MED ORDER — VANCOMYCIN HCL IN DEXTROSE 1-5 GM/200ML-% IV SOLN
INTRAVENOUS | Status: AC
  Filled 2017-04-20: qty 200

## 2017-04-20 MED ORDER — VANCOMYCIN HCL 10 G IV SOLR
1750.0000 mg | Freq: Once | INTRAVENOUS | Status: AC
  Administered 2017-04-20: 1750 mg via INTRAVENOUS
  Filled 2017-04-20: qty 1750

## 2017-04-20 MED ORDER — GABAPENTIN 600 MG PO TABS
ORAL_TABLET | ORAL | Status: AC
  Filled 2017-04-20: qty 1

## 2017-04-20 MED ORDER — MORPHINE SULFATE (PF) 4 MG/ML IV SOLN
4.0000 mg | Freq: Once | INTRAVENOUS | Status: AC
  Administered 2017-04-20: 4 mg via INTRAVENOUS
  Filled 2017-04-20: qty 1

## 2017-04-20 MED ORDER — CEFTRIAXONE SODIUM IN DEXTROSE 20 MG/ML IV SOLN
1.0000 g | Freq: Once | INTRAVENOUS | Status: AC
  Administered 2017-04-20: 1 g via INTRAVENOUS
  Filled 2017-04-20: qty 50

## 2017-04-20 MED ORDER — ONDANSETRON HCL 4 MG/2ML IJ SOLN
4.0000 mg | Freq: Once | INTRAMUSCULAR | Status: AC
  Administered 2017-04-20: 4 mg via INTRAVENOUS
  Filled 2017-04-20: qty 2

## 2017-04-20 NOTE — Care Management (Signed)
Susan Pratt from [facility]  reported that patient's husband says he can not manage patient's IV antibiotic therapy at home.  There are no other avaialbe caregivers to assume this responsibility.  Patient refused the initial physical therapy home health visit citing chronic pain issues.  This CM spoke with ED  CM Beau Fanny and reported this.  There was a verbalized concern about patient's white count,  which even though is elevated, is still lower than it was on the day of discharge.  Discussed the possibility of seeking skilled nursing placement from the ED if there are no other issues of medical necessity for readmission.

## 2017-04-20 NOTE — ED Notes (Signed)
Brad from [facility] called with update; they want to avoid readmission; states pt is refusing PT and she is apparently suffering from chronic pain.

## 2017-04-20 NOTE — ED Provider Notes (Signed)
[facility] Emergency Department Provider Note  ____________________________________________  Time seen: Approximately 7:18 AM  I have reviewed the triage vital signs and the nursing notes.   HISTORY  Chief Complaint Back Pain   HPI Susan Pratt is a 76 y.o. 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 Last dose of vancomycin was yesterday at 9AM. She is complaining of pain which has been chronic for several months located in her thoracic region, currently 10 out of 10, constant and nonradiating. She is on tramadol at home however she reports that has not worked. She also has had a productive cough since being discharged from the hospital but denies chest pain or shortness of breath. No fever or chills, no vomiting or diarrhea. Patient is incontinent of urine which is her baseline. 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 She has several decubitus ulcers. She denies abdominal pain.  Past Medical History:  Diagnosis Date  . Arthritis    ra  . Bronchospastic airway disease   . Cancer (HCC)    cervical  . Cataract   . Diabetes mellitus without complication ([place])   . 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 Hyperlipidemia   . Hypertension   . 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 Neuropathy    peripheral  . PE (pulmonary embolism)    2015    Patient Active Problem List   Diagnosis Date Noted  . Pressure injury of skin 04/11/2017  . Sepsis ([place]) 04/10/2017  . 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 Arthritis of shoulder region, degenerative 10/10/2015  .  Rotator cuff arthropathy 10/10/2015  . Acute bronchospasm 08/22/2015  . Vitamin D deficiency 07/04/2015  . Vitamin B12 deficiency 07/04/2015  . Hx pulmonary embolism 07/04/2015  . Bronchospastic airway disease 06/20/2015  . Rheumatoid arthritis ([place]) 06/19/2015  . Obesity, Class II, BMI 35-39.9 06/19/2015  . At risk for falling 06/18/2015  . Calcification of bronchus or trachea 01/24/2014  . Hx of cancer of endometrium 11/09/2012  . Hyperlipidemia 11/09/2012  . Incomplete bladder emptying 02/01/2012  . Mixed incontinence 02/01/2012  . Type 2 diabetes, controlled, with neuropathy ([place]) 01/12/2011  . Generalized OA 01/12/2011  . Hypertension 01/12/2011  . Back ache 01/12/2011  . Peripheral nerve disease 01/12/2011    Past Surgical History:  Procedure Laterality Date  . ABDOMINAL HYSTERECTOMY    . APPENDECTOMY    . BACK SURGERY     2010  . BLADDER SURGERY     STIMULATOR  . CHOLECYSTECTOMY    . EYE SURGERY    . JOINT REPLACEMENT    . KNEE SURGERY    . REVERSE SHOULDER ARTHROPLASTY Left 10/23/2015   Procedure: REVERSE SHOULDER ARTHROPLASTY;  Surgeon: Corky Mull, MD;  Location: [facility] ORS;  Service: Orthopedics;  Laterality: Left;    Prior to Admission medications   Medication Sig Start Date End Date Taking? Authorizing Provider  amLODipine (NORVASC) 10 MG tablet Take 1 tablet (10 mg total) by mouth daily. 10/08/15  Yes Plonk, Gwyndolyn Saxon, MD  aspirin EC 81 MG tablet Take 81  mg by mouth daily.   Yes [provider]  atenolol (TENORMIN) 50 MG tablet Take by mouth. 04/15/11  Yes [provider]  cholecalciferol (VITAMIN D) 1000 units tablet Take 1,000 Units by mouth daily.   Yes [provider]  gabapentin (NEURONTIN) 300 MG capsule Take 2 capsules 4 (four) times daily by mouth.  10/10/13  Yes [provider]  hydroxychloroquine (PLAQUENIL) 200 MG tablet Take 200 mg by mouth 2 (two) times daily.   Yes [provider]  metFORMIN (GLUCOPHAGE)  500 MG tablet Take 1 tablet (500 mg total) by mouth 2 (two) times daily with a meal. 11/11/15  Yes Plonk, Gwyndolyn Saxon, MD  methotrexate (RHEUMATREX) 2.5 MG tablet Take 2.5 mg by mouth once a week. Caution:Chemotherapy. Protect from light.   Yes [provider]  Multiple Vitamin (MULTIVITAMIN) capsule Take 1 capsule by mouth daily.   Yes [provider]  protein supplement shake (PREMIER PROTEIN) LIQD Take 325 mLs (11 oz total) 2 (two) times daily between meals by mouth. 04/18/17  Yes Max Sane, MD  traMADol (ULTRAM) 50 MG tablet Take 1 tablet (50 mg total) every 6 (six) hours as needed by mouth for moderate pain. 04/18/17  Yes Max Sane, MD  vancomycin IVPB Inject 1,750 mg daily into the vein. Indication:  Thoracic spine discitis Last Day of Therapy:  05/23/17 Labs - Sunday/Monday:  CBC/D, BMP, and vancomycin trough. Labs - Thursday:  BMP and vancomycin trough Labs - Every other week:  ESR and CRP 04/19/17 05/23/17 Yes Max Sane, MD  vitamin B-12 (CYANOCOBALAMIN) 1000 MCG tablet Take 1,000 mcg by mouth daily.   Yes [provider]  albuterol (PROAIR HFA) 108 (90 Base) MCG/ACT inhaler Inhale into the lungs. 12/31/13   [provider]  glucose blood (BAYER CONTOUR NEXT TEST) test strip  01/19/13   [provider]  glucose blood test strip Use as instructed 10/23/15   Plonk, Gwyndolyn Saxon, MD    Allergies Lisinopril  Family History  Problem Relation Age of Onset  . Cancer Mother   . Diabetes Mother   . Heart attack Father     Social History Social History   Tobacco Use  . Smoking status: Never Smoker  . Smokeless tobacco: Never Used  Substance Use Topics  . Alcohol use: No  . Drug use: No    Review of Systems  Constitutional: Negative for fever. + Generalized weakness Eyes: Negative for visual changes. ENT: Negative for sore throat. Neck: No neck pain  Cardiovascular: Negative for chest pain. Respiratory: Negative for shortness of breath. +  Productive cough Gastrointestinal: Negative for abdominal pain, vomiting or diarrhea. Genitourinary: Negative for dysuria. Musculoskeletal: +back pain. Skin: Negative for rash. + Decubitus ulcers  Neurological: Negative for headaches, weakness or numbness. Psych: No SI or HI  ____________________________________________   PHYSICAL EXAM:  VITAL SIGNS: ED Triage Vitals  Enc Vitals Group     BP 04/20/17 0713 (!) 167/91     Pulse Rate 04/20/17 0713 (!) 102     Resp 04/20/17 0713 (!) 24     Temp 04/20/17 0713 98.3 F (36.8 C)     Temp Source 04/20/17 0713 Oral     SpO2 04/20/17 0650 94 %     Weight 04/20/17 0701 260 lb (117.9 kg)     Height 04/20/17 0701 5' 10"  (1.778 m)     Head Circumference --      Peak Flow --      Pain Score --  Pain Loc --      Pain Edu? --      Excl. in [place]? --     Constitutional: Alert and oriented, disheveled, strong smell of urine, incontinent HEENT:      Head: Normocephalic and atraumatic.         Eyes: Conjunctivae are normal. Sclera is non-icteric.       Mouth/Throat: Mucous membranes are dry.       Neck: Supple with no signs of meningismus. Cardiovascular: Tachycardic with regular rhythm. No murmurs, gallops, or rubs. 2+ symmetrical distal pulses are present in all extremities. No JVD. Respiratory: Normal respiratory effort. Coughing. Lungs are clear to auscultation bilaterally. No wheezes, crackles, or rhonchi.  Gastrointestinal: Soft, non tender, and non distended with positive bowel sounds. No rebound or guarding. Musculoskeletal: No edema, cyanosis, or erythema of extremities. ttp over the midline on thoracic spine. Neurologic: Normal speech and language. Face is symmetric. Moving upper extremities, unable to move b/l LE Skin: Skin is warm, dry and intact. Several stage I decubitus ulcers Psychiatric: Mood and affect are normal. Speech and behavior are normal.  ____________________________________________   LABS (all labs ordered are  listed, but only abnormal results are displayed)  Labs Reviewed  CBC WITH DIFFERENTIAL/PLATELET - Abnormal; Notable for the following components:      Result Value   WBC 18.0 (*)    RDW 15.3 (*)    Platelets 598 (*)    Neutro Abs 14.7 (*)    All other components within normal limits  COMPREHENSIVE METABOLIC PANEL - Abnormal; Notable for the following components:   Chloride 100 (*)    Glucose, Bld 157 (*)    BUN 21 (*)    Calcium 8.8 (*)    Total Protein 6.4 (*)    Albumin 2.4 (*)    Total Bilirubin 1.4 (*)    All other components within normal limits  URINALYSIS, COMPLETE (UACMP) WITH MICROSCOPIC - Abnormal; Notable for the following components:   Color, Urine YELLOW (*)    APPearance CLOUDY (*)    Hgb urine dipstick SMALL (*)    Ketones, ur 5 (*)    Leukocytes, UA LARGE (*)    Squamous Epithelial / LPF 6-30 (*)    All other components within normal limits  URINE CULTURE  TROPONIN I  LACTIC ACID, PLASMA   ____________________________________________  EKG  ED ECG REPORT I, Rudene Re, the attending physician, personally viewed and interpreted this ECG.   Sinus tachycardia, rate of 101, normal intervals, normal axis, no ST elevations or depressions.  ____________________________________________  RADIOLOGY  CT thoracic spine:  1. Strong suspicion of spinal infection with Discitis-Osteomyelitis at T9-T10 which has progressed since 04/12/2017: - progressed paraspinal phlegmon, tracking cephalad on the left to the T8-T9 level. No discrete abscess. - mild progression of T9 inferior endplate erosion. Intermediate density fluid suspected in the disc space. 2. Subsequent unstable spine at T9-T10, related to the disruption through the disc space plus bilateral T9 pedicle fractures plus underlying diffuse thoracic and upper lumbar ankylosis related to diffuse idiopathic skeletal hyperostosis. Recommend Spine Surgery consultation. 3. Superimposed moderate to severe  multifactorial spinal stenosis at T9-T10. 4. Moderate to large left and small right layering pleural effusions with lower lobe lung disease appears not significantly changed. .  CXR: Decreasing left effusion following scratched at decreasing left effusion. Continued bibasilar atelectasis or infiltrates.  Cardiomegaly, vascular congestion. ____________________________________________   PROCEDURES  Procedure(s) performed: None Procedures Critical Care performed: yes  CRITICAL CARE Performed by:  Rudene Re  ?  Total critical care time: 45 min  Critical care time was exclusive of separately billable procedures and treating other patients.  Critical care was necessary to treat or prevent imminent or life-threatening deterioration.  Critical care was time spent personally by me on the following activities: development of treatment plan with patient and/or surrogate as well as nursing, discussions with consultants, evaluation of patient's response to treatment, examination of patient, obtaining history from patient or surrogate, ordering and performing treatments and interventions, ordering and review of laboratory studies, ordering and review of radiographic studies, pulse oximetry and re-evaluation of patient's condition.  ____________________________________________   INITIAL IMPRESSION / ASSESSMENT AND PLAN / ED COURSE  76 y.o. 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 Last vancomycin was yesterday at 9AM. Abx for disciits of thoracic spine. Patient looks disheveled, incontinent of urine, smells strong of urine. PICC line in place. Productive cough. Vital showing tachycardia with a pulse between 99 and 102. Normal respirations and normal sats, normotensive. She is tender to palpation in  her thoracic spine and has several stage I decubitus ulcers. Will give Fentanyl. Foley catheter was placed. We'll give her a dose of vancomycin. We'll check labs for signs of sepsis. Will repeat CT thoracic spine to eval for progression of disease (patient unable to undergo MRI due to pain modulator).   Clinical Course as of Apr 20 1142  Wed Apr 20, 2017  1040 CT concerning for worsening infection and unstable T9 b/l pedicle fracture. Patient's prior spinal surgeries were at [facility]. [facility] consulted for admission and transfer. [facility] neurosurgeon Dr. Arnoldo Morale recommended consulting [facility] since patient was last seen by [facility] 6 years ago.  [CV]  4944 Spoke with Dr. Izora Ribas, [facility] who recommended transfer to [facility]. Patient [facility] be transferred to [facility] ED per his request  [CV]    Clinical Course User Index [CV] Alfred Levins [place], MD     As part of my medical decision making, I reviewed the following data within the [place] notes reviewed and incorporated, Labs reviewed , EKG interpreted , Old chart reviewed, Radiograph reviewed , Discussed with admitting physician , A consult was requested and obtained from this/these consultant(s) Neurosurgery, Notes from prior ED visits and [place] Controlled Substance Database    Pertinent labs & imaging results that were available during my care of the patient were reviewed by me and considered in my medical decision making (see chart for details).    ____________________________________________   FINAL CLINICAL IMPRESSION(S) / ED DIAGNOSES  Final diagnoses:  Discitis of thoracic region  Osteomyelitis of thoracic region [facility])  Other closed fracture of ninth thoracic vertebra, initial encounter ([place])      NEW MEDICATIONS STARTED DURING THIS VISIT:  This SmartLink is deprecated. Use AVSMEDLIST instead to display the medication list for a patient.   Note:  This document was prepared using Dragon voice recognition  software and may include unintentional dictation errors.    Rudene Re, MD 04/20/17 [phone]

## 2017-04-20 NOTE — ED Notes (Signed)
[facility]  CALLED  TO  TRANSPORT  PT  TO  [facility]  ER  INFORMED  RN  ANGELA  AND DR  Alfred Levins  MD

## 2017-04-20 NOTE — Care Management Note (Signed)
Case Management Note  Patient Details  Name: Susan Pratt MRN: [id number] Date of Birth: January 28, 1941  Subjective/Objective:    Spoke with patient with CSW in attendance. She is in a lot of pain and appears to have trouble focusing. We have explained to her that if she does not end up being transferred we would be back to tralk to her about SNF options.    The patient mentions several times her husband cannot help her ambulate or do any ADL's. We have let ehr know we understand that and will look at available facilities, and when asked she says she does not have a preference. Will follow.            Action/Plan:   Expected Discharge Date:                  Expected Discharge Plan:     In-House Referral:     Discharge planning Services     Post Acute Care Choice:    Choice offered to:     DME Arranged:    DME Agency:     HH Arranged:    HH Agency:     Status of Service:     If discussed at [employer] of Stay Meetings, dates discussed:    Additional Comments:  Beau Fanny, RN 04/20/2017, 10:57 AM

## 2017-04-20 NOTE — ED Triage Notes (Signed)
Pt arrived from home via ems with complaints of constant back upper and mid back pain. Pt recently discharged from hospital where she was treated for UTI. Pt arrived saturated in urine with a strong ammonia smell. Pt arrived with skin brake down on her sacral region and perineal areas, multiple stage two possible stage 3 pressure ulcers. Pt also has skin breakdown on her mid back and under arms.

## 2017-04-20 NOTE — Care Management (Addendum)
Patient is currently being followed by [facility] for iv antibiotics and nursing and physical therapy. Visit frequency most likely be increased and a Education officer, museum  added to address any social issues and an aide for personal care.  Please contact agency liaison to address concerns and additional services to avoid readmission if at all possible.  Have notified Brad with [facility] to contact the ED CM to discuss

## 2017-04-20 NOTE — ED Notes (Signed)
Pt taken off O2, will monitor sats.

## 2017-04-20 NOTE — ED Notes (Signed)
Sacral dressing applied to pressure ulcer area.

## 2017-04-20 NOTE — ED Notes (Signed)
RN called [facility] to see if they were available to transport pt. Per Cheri Rous at [employer], they will not have truck available until this evening to transport. Will have Programmer, multimedia.

## 2017-04-20 NOTE — ED Notes (Signed)
[facility] at bedside 

## 2017-04-20 NOTE — ED Notes (Signed)
This nurse called husband to determine last time she was given vancomycin. He states he gave it to her at 0930 yesterday. He stated that he could not come see her today because "I can barely breathe." He reports that he has copd and that the urine smell makes his copd symptoms increase. He states he cannot pick her up because she can't help him, so he can't get her to the toilet, so she is constantly lying in urine. He asked to be kept informed. Pharmacy notified of time of last vanc dose.

## 2017-04-20 NOTE — ED Notes (Signed)
EMTALA REVIEWED 

## 2017-04-20 NOTE — ED Notes (Signed)
Pt O2 sat dropped to 75% on room air with good wave form. RN in room to check on patient, instructed pt to take deep breaths. Pt O2 sat increased to 81%. Pt was placed on 3 liter via [place]. Dr. Alfred Levins at bedside and aware.

## 2017-04-21 LAB — URINE CULTURE: Culture: >=100000 — AB

## 2017-04-22 NOTE — Progress Notes (Signed)
ED CULTURE REPORT   76yo female came to ED with symptoms of pain. Has a history of chronic back pain and  Discitis/osteomyelitis and is on chronic IV antibiotics (Vancomycin) at home. Her UA showed yeast at the time of her ED visit, and her urine culture (resulted 11/23) showed yeast as well. She was not discharged on any antibiotics from the ED. I spoke with Dr. Jacqualine Code who wanted to consult ID. I spoke with [facility] ID MD and he stated that this patient did not need anti-fungal treatment. Relayed this information to Dr. Jacqualine Code who agreed.    Lendon Ka, PharmD Pharmacy Resident

## 2017-04-27 ENCOUNTER — Inpatient Hospital Stay: Payer: Medicare Other | Admitting: Family Medicine

## 2017-04-27 MED ORDER — HYDROXYCHLOROQUINE SULFATE 200 MG PO TABS
200.00 | ORAL_TABLET | ORAL | Status: DC
Start: 2017-04-27 — End: 2017-04-27

## 2017-04-27 MED ORDER — GENERIC EXTERNAL MEDICATION
1.75 | Status: DC
Start: 2017-04-28 — End: 2017-04-27

## 2017-04-27 MED ORDER — ACETAMINOPHEN 325 MG PO TABS
650.00 | ORAL_TABLET | ORAL | Status: DC
Start: ? — End: 2017-04-27

## 2017-04-27 MED ORDER — GENERIC EXTERNAL MEDICATION
Status: DC
Start: ? — End: 2017-04-27

## 2017-04-27 MED ORDER — GABAPENTIN 300 MG PO CAPS
600.00 | ORAL_CAPSULE | ORAL | Status: DC
Start: 2017-04-27 — End: 2017-04-27

## 2017-04-27 MED ORDER — GENERIC EXTERNAL MEDICATION
1000.00 | Status: DC
Start: 2017-04-28 — End: 2017-04-27

## 2017-04-27 MED ORDER — SENNOSIDES-DOCUSATE SODIUM 8.6-50 MG PO TABS
2.00 | ORAL_TABLET | ORAL | Status: DC
Start: 2017-04-27 — End: 2017-04-27

## 2017-04-27 MED ORDER — GLUCAGON HCL RDNA (DIAGNOSTIC) 1 MG IJ SOLR
1.00 | INTRAMUSCULAR | Status: DC
Start: ? — End: 2017-04-27

## 2017-04-27 MED ORDER — ENOXAPARIN SODIUM 100 MG/ML ~~LOC~~ SOLN
100.00 | SUBCUTANEOUS | Status: DC
Start: 2017-04-27 — End: 2017-04-27

## 2017-04-27 MED ORDER — IPRATROPIUM-ALBUTEROL 0.5-2.5 (3) MG/3ML IN SOLN
3.00 | RESPIRATORY_TRACT | Status: DC
Start: ? — End: 2017-04-27

## 2017-04-27 MED ORDER — LIDOCAINE HCL (PF) 1 % IJ SOLN
.50 | INTRAMUSCULAR | Status: DC
Start: ? — End: 2017-04-27

## 2017-04-27 MED ORDER — AMLODIPINE BESYLATE 5 MG PO TABS
10.00 | ORAL_TABLET | ORAL | Status: DC
Start: 2017-04-28 — End: 2017-04-27

## 2017-04-27 MED ORDER — DEXTROSE 50 % IV SOLN
12.50 | INTRAVENOUS | Status: DC
Start: ? — End: 2017-04-27

## 2017-04-27 MED ORDER — HYDROMORPHONE HCL 2 MG PO TABS
2.00 | ORAL_TABLET | ORAL | Status: DC
Start: ? — End: 2017-04-27

## 2017-04-27 MED ORDER — POLYETHYLENE GLYCOL 3350 17 G PO PACK
17.00 | PACK | ORAL | Status: DC
Start: ? — End: 2017-04-27

## 2017-04-27 MED ORDER — GENERIC EXTERNAL MEDICATION
12.50 | Status: DC
Start: 2017-04-27 — End: 2017-04-27

## 2017-04-27 MED ORDER — MULTI-VITAMINS PO TABS
1.00 | ORAL_TABLET | ORAL | Status: DC
Start: 2017-04-28 — End: 2017-04-27

## 2017-04-27 MED ORDER — CHOLECALCIFEROL 50 MCG (2000 UT) PO TABS
2000.00 | ORAL_TABLET | ORAL | Status: DC
Start: 2017-04-28 — End: 2017-04-27

## 2017-04-27 MED ORDER — BUDESONIDE-FORMOTEROL FUMARATE 80-4.5 MCG/ACT IN AERO
2.00 | INHALATION_SPRAY | RESPIRATORY_TRACT | Status: DC
Start: 2017-04-27 — End: 2017-04-27

## 2017-06-11 ENCOUNTER — Emergency Department
Admission: EM | Admit: 2017-06-11 | Discharge: 2017-06-11 | Disposition: A | Discharge status: Home / Self Care | Payer: Medicare Other | Attending: Emergency Medicine | Admitting: Emergency Medicine

## 2017-06-11 ENCOUNTER — Other Ambulatory Visit: Payer: Self-pay

## 2017-06-11 DIAGNOSIS — E119 Type 2 diabetes mellitus without complications: Secondary | ICD-10-CM | POA: Insufficient documentation

## 2017-06-11 DIAGNOSIS — I10 Essential (primary) hypertension: Secondary | ICD-10-CM | POA: Insufficient documentation

## 2017-06-11 DIAGNOSIS — R531 Weakness: Secondary | ICD-10-CM | POA: Insufficient documentation

## 2017-06-11 DIAGNOSIS — E1140 Type 2 diabetes mellitus with diabetic neuropathy, unspecified: Secondary | ICD-10-CM | POA: Insufficient documentation

## 2017-06-11 DIAGNOSIS — K5900 Constipation, unspecified: Secondary | ICD-10-CM | POA: Diagnosis present

## 2017-06-11 DIAGNOSIS — Z7984 Long term (current) use of oral hypoglycemic drugs: Secondary | ICD-10-CM | POA: Insufficient documentation

## 2017-06-11 DIAGNOSIS — M069 Rheumatoid arthritis, unspecified: Secondary | ICD-10-CM | POA: Insufficient documentation

## 2017-06-11 DIAGNOSIS — Z8541 Personal history of malignant neoplasm of cervix uteri: Secondary | ICD-10-CM | POA: Diagnosis not present

## 2017-06-11 DIAGNOSIS — Z7901 Long term (current) use of anticoagulants: Secondary | ICD-10-CM | POA: Diagnosis not present

## 2017-06-11 DIAGNOSIS — Z7982 Long term (current) use of aspirin: Secondary | ICD-10-CM | POA: Insufficient documentation

## 2017-06-11 DIAGNOSIS — Z96612 Presence of left artificial shoulder joint: Secondary | ICD-10-CM | POA: Diagnosis not present

## 2017-06-11 DIAGNOSIS — Z79899 Other long term (current) drug therapy: Secondary | ICD-10-CM | POA: Insufficient documentation

## 2017-06-11 LAB — COMPREHENSIVE METABOLIC PANEL
ALT: 18 U/L (ref 14–54)
ANION GAP: 7 (ref 5–15)
AST: 14 U/L — ABNORMAL LOW (ref 15–41)
Albumin: 3 g/dL — ABNORMAL LOW (ref 3.5–5.0)
Alkaline Phosphatase: 61 U/L (ref 38–126)
BILIRUBIN TOTAL: 1.2 mg/dL (ref 0.3–1.2)
BUN: 22 mg/dL — ABNORMAL HIGH (ref 6–20)
CO2: 24 mmol/L (ref 22–32)
Calcium: 9 mg/dL (ref 8.9–10.3)
Chloride: 110 mmol/L (ref 101–111)
Creatinine, Ser: 0.68 mg/dL (ref 0.44–1.00)
GLUCOSE: 115 mg/dL — AB (ref 65–99)
POTASSIUM: 3.7 mmol/L (ref 3.5–5.1)
Sodium: 141 mmol/L (ref 135–145)
TOTAL PROTEIN: 6.3 g/dL — AB (ref 6.5–8.1)

## 2017-06-11 LAB — URINALYSIS, ROUTINE W REFLEX MICROSCOPIC
BILIRUBIN URINE: NEGATIVE
Bacteria, UA: NONE SEEN
GLUCOSE, UA: NEGATIVE mg/dL
KETONES UR: NEGATIVE mg/dL
NITRITE: NEGATIVE
Protein, ur: NEGATIVE mg/dL
SPECIFIC GRAVITY, URINE: 1.016 (ref 1.005–1.030)
pH: 5 (ref 5.0–8.0)

## 2017-06-11 LAB — CBC
HEMATOCRIT: 32.5 % — AB (ref 35.0–47.0)
Hemoglobin: 10.6 g/dL — ABNORMAL LOW (ref 12.0–16.0)
MCH: 29.9 pg (ref 26.0–34.0)
MCHC: 32.5 g/dL (ref 32.0–36.0)
MCV: 91.9 fL (ref 80.0–100.0)
Platelets: 397 10*3/uL (ref 150–440)
RBC: 3.54 MIL/uL — ABNORMAL LOW (ref 3.80–5.20)
RDW: 17 % — AB (ref 11.5–14.5)
WBC: 11.1 10*3/uL — ABNORMAL HIGH (ref 3.6–11.0)

## 2017-06-11 LAB — PROTIME-INR
INR: 0.95
Prothrombin Time: 12.6 seconds (ref 11.4–15.2)

## 2017-06-11 MED ORDER — TRAMADOL HCL 50 MG PO TABS: 50.0000 mg | ORAL_TABLET | Freq: Four times a day (QID) | ORAL | 0 refills | Status: AC | PRN

## 2017-06-11 MED ORDER — NYSTATIN 100000 UNIT/GM EX POWD: Freq: Four times a day (QID) | CUTANEOUS | 0 refills | Status: AC

## 2017-06-11 NOTE — Discharge Instructions (Signed)
Continue your home medications and follow up with primary care. Follow up with Infectious Disease within the next week for further evaluation of your spine infection to see if you need more antibiotics or if the PICC line can be removed.  Your Vancomycin is scheduled to end tomorrow.

## 2017-06-11 NOTE — NC FL2 (Signed)
[place] LEVEL OF CARE SCREENING TOOL     IDENTIFICATION  Patient Name: Susan Pratt Birthdate: 05/28/1941 Sex: female Admission Date (Current Location): 06/11/2017  [place] and [place] Number:  Engineering geologist and Address:  [facility], [address], [place],  [address]      Provider Number: [id number]  Attending Physician Name and Address:  Harvest Dark, MD  Relative Name and Phone Number:       Current Level of Care: Hospital Recommended Level of Care: [place] Prior Approval Number:    Date Approved/Denied:   PASRR Number: [id number] )  Discharge Plan: SNF    Current Diagnoses: Patient Active Problem List   Diagnosis Date Noted  . Pressure injury of skin 04/11/2017  . Sepsis ([place]) 04/10/2017  . 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 Arthritis of shoulder region, degenerative 10/10/2015  . Rotator cuff arthropathy 10/10/2015  . Acute bronchospasm 08/22/2015  . Vitamin D deficiency 07/04/2015  . Vitamin B12 deficiency 07/04/2015  . Hx pulmonary embolism 07/04/2015  . Bronchospastic airway disease 06/20/2015  . Rheumatoid arthritis ([place]) 06/19/2015  . Obesity, Class II, BMI 35-39.9 06/19/2015  . At risk for falling 06/18/2015  . Calcification of bronchus or trachea 01/24/2014  . Hx of cancer of endometrium 11/09/2012  . Hyperlipidemia 11/09/2012  . Incomplete bladder emptying 02/01/2012  . Mixed incontinence 02/01/2012  . Type 2 diabetes, controlled, with neuropathy ([place]) 01/12/2011  . Generalized OA 01/12/2011  . Hypertension 01/12/2011  . Back ache 01/12/2011  . Peripheral nerve disease 01/12/2011    Orientation RESPIRATION BLADDER Height & Weight     Self, Time, Situation, Place  Normal Continent Weight: 244 lb 12.8 oz (111 kg) Height:  _0  (172.7 cm)  BEHAVIORAL SYMPTOMS/MOOD NEUROLOGICAL BOWEL NUTRITION STATUS      Continent Diet(Regular Diet. )   AMBULATORY STATUS COMMUNICATION OF NEEDS Skin   Total Care Verbally Normal                       Personal Care Assistance Level of Assistance  Bathing, Feeding, Dressing Bathing Assistance: Limited assistance Feeding assistance: Independent Dressing Assistance: Limited assistance     Functional Limitations Info  Sight, Hearing, Speech Sight Info: Adequate Hearing Info: Adequate Speech Info: Adequate    SPECIAL CARE FACTORS FREQUENCY  PT (By licensed PT)     PT Frequency: (4-5)              Contractures      Additional Factors Info  Code Status Code Status Info: (Full Code. )             Current Medications (06/11/2017):  This is the current hospital active medication list No current facility-administered medications for this encounter.    Current Outpatient Medications  Medication Sig Dispense Refill  . albuterol (PROAIR HFA) 108 (90 Base) MCG/ACT inhaler Inhale into the lungs.    Marland Kitchen amLODipine (NORVASC) 10 MG tablet Take 1 tablet (10 mg total) by mouth daily. 90 tablet 3  . aspirin EC 81 MG tablet Take 81 mg by mouth daily.    Marland Kitchen atenolol (TENORMIN) 50 MG tablet Take by mouth.    . cholecalciferol (VITAMIN D) 1000 units tablet Take 1,000 Units by mouth daily.    Marland Kitchen gabapentin (NEURONTIN) 300 MG capsule Take 2 capsules 4 (four) times daily by mouth.     Marland Kitchen glucose blood (BAYER CONTOUR NEXT TEST) test strip     .  glucose blood test strip Use as instructed 100 each 12  . hydroxychloroquine (PLAQUENIL) 200 MG tablet Take 200 mg by mouth 2 (two) times daily.    . metFORMIN (GLUCOPHAGE) 500 MG tablet Take 1 tablet (500 mg total) by mouth 2 (two) times daily with a meal. 180 tablet 3  . methotrexate (RHEUMATREX) 2.5 MG tablet Take 2.5 mg by mouth once a week. Caution:Chemotherapy. Protect from light.    . Multiple Vitamin (MULTIVITAMIN) capsule Take 1 capsule by mouth daily.    . protein supplement shake (PREMIER PROTEIN) LIQD Take 325 mLs (11 oz total) 2 (two)  times daily between meals by mouth. 130 Can 0  . traMADol (ULTRAM) 50 MG tablet Take 1 tablet (50 mg total) every 6 (six) hours as needed by mouth for moderate pain. 30 tablet 1  . vancomycin IVPB Inject 1,750 mg daily into the vein. Indication:  Thoracic spine discitis Last Day of Therapy:  05/23/17 Labs - Sunday/Monday:  CBC/D, BMP, and vancomycin trough. Labs - Thursday:  BMP and vancomycin trough Labs - Every other week:  ESR and CRP 34 Units 0  . vitamin B-12 (CYANOCOBALAMIN) 1000 MCG tablet Take 1,000 mcg by mouth daily.       Discharge Medications: Please see discharge summary for a list of discharge medications.  Relevant Imaging Results:  Relevant Lab Results:   Additional Information (SSN: [id number])  Ho Parisi, [place] Beets, LCSW

## 2017-06-11 NOTE — ED Triage Notes (Signed)
Patient has not had BM in 4 days, she has tried stool softeners and laxatives. However, patient takes Morphine q 12 hrs

## 2017-06-11 NOTE — ED Notes (Addendum)
Called [place] to give report. Gave report to [employer] LPN

## 2017-06-11 NOTE — ED Provider Notes (Signed)
[facility] Emergency Department Provider Note  Time seen: 1:55 PM  I have reviewed the triage vital signs and the nursing notes.   HISTORY  Chief Complaint Constipation (last 4 days)    HPI Susan Pratt is a 77 y.o. female with a past medical history of diabetes, hypertension, hyperlipidemia, PE history on Coumadin, recent history of bacteremia from MRSA, nonambulatory, severe back issues not surgical candidate, L-spine osteomyelitis, presents to the emergency department for medical evaluation.  According to the patient and family patient was previously at [facility] nursing [facility], however approximate 1 month ago she returned back home and was cared for by her husband.  Unfortunately the husband recently had a heart attack and is now at [facility].  Patient's son lives in [place] but has been staying with her over the past 1 week to help care for her.  Son states he has to return back to [place] for work, and the patient is unable to care for herself at home.  Patient is nonambulatory unable to get up out of bed without significant assistance.  They have been unable to transport her to doctor appointments, etc.  During review of systems the patient does note lower back pain which is chronic, constipation for the past 3-4 days, chronic likely due to pain medication which the patient takes twice daily.  Patient does not appear to have any acute medical complaints besides the inability to adequately care for herself at home.  Past Medical History:  Diagnosis Date  . Arthritis    ra  . Bronchospastic airway disease   . Cancer (HCC)    cervical  . Cataract   . Diabetes mellitus without complication ([place])   . 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 Hyperlipidemia   . Hypertension   . 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 Neuropathy    peripheral  . PE (pulmonary embolism)    2015    Patient Active Problem List   Diagnosis  Date Noted  . Pressure injury of skin 04/11/2017  . Sepsis ([place]) 04/10/2017  . 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 Arthritis of shoulder region, degenerative 10/10/2015  . Rotator cuff arthropathy 10/10/2015  . Acute bronchospasm 08/22/2015  . Vitamin D deficiency 07/04/2015  . Vitamin B12 deficiency 07/04/2015  . Hx pulmonary embolism 07/04/2015  . Bronchospastic airway disease 06/20/2015  . Rheumatoid arthritis ([place]) 06/19/2015  . Obesity, Class II, BMI 35-39.9 06/19/2015  . At risk for falling 06/18/2015  . Calcification of bronchus or trachea 01/24/2014  . Hx of cancer of endometrium 11/09/2012  . Hyperlipidemia 11/09/2012  . Incomplete bladder emptying 02/01/2012  . Mixed incontinence 02/01/2012  . Type 2 diabetes, controlled, with neuropathy ([place]) 01/12/2011  . Generalized OA 01/12/2011  . Hypertension 01/12/2011  . Back ache 01/12/2011  . Peripheral nerve disease 01/12/2011    Past Surgical History:  Procedure Laterality Date  . ABDOMINAL HYSTERECTOMY    . APPENDECTOMY    . BACK SURGERY     2010  . BLADDER SURGERY     STIMULATOR  . CHOLECYSTECTOMY    . EYE SURGERY    . JOINT REPLACEMENT    . KNEE SURGERY    . REVERSE SHOULDER ARTHROPLASTY Left 10/23/2015   Procedure: REVERSE SHOULDER ARTHROPLASTY;  Surgeon: Corky Mull, MD;  Location: [facility] ORS;  Service: Orthopedics;  Laterality: Left;    Prior to Admission  medications   Medication Sig Start Date End Date Taking? Authorizing Provider  albuterol (PROAIR HFA) 108 (90 Base) MCG/ACT inhaler Inhale into the lungs. 12/31/13   [provider]  amLODipine (NORVASC) 10 MG tablet Take 1 tablet (10 mg total) by mouth daily. 10/08/15   Plonk, Gwyndolyn Saxon, MD  aspirin EC 81 MG tablet Take 81 mg by mouth daily.    [provider]  atenolol (TENORMIN) 50 MG tablet Take by mouth. 04/15/11   [provider]  cholecalciferol (VITAMIN D) 1000 units tablet Take 1,000 Units by mouth  daily.    [provider]  gabapentin (NEURONTIN) 300 MG capsule Take 2 capsules 4 (four) times daily by mouth.  10/10/13   [provider]  glucose blood (BAYER CONTOUR NEXT TEST) test strip  01/19/13   [provider]  glucose blood test strip Use as instructed 10/23/15   Plonk, Gwyndolyn Saxon, MD  hydroxychloroquine (PLAQUENIL) 200 MG tablet Take 200 mg by mouth 2 (two) times daily.    [provider]  metFORMIN (GLUCOPHAGE) 500 MG tablet Take 1 tablet (500 mg total) by mouth 2 (two) times daily with a meal. 11/11/15   Plonk, Gwyndolyn Saxon, MD  methotrexate (RHEUMATREX) 2.5 MG tablet Take 2.5 mg by mouth once a week. Caution:Chemotherapy. Protect from light.    [provider]  Multiple Vitamin (MULTIVITAMIN) capsule Take 1 capsule by mouth daily.    [provider]  protein supplement shake (PREMIER PROTEIN) LIQD Take 325 mLs (11 oz total) 2 (two) times daily between meals by mouth. 04/18/17   Max Sane, MD  traMADol (ULTRAM) 50 MG tablet Take 1 tablet (50 mg total) every 6 (six) hours as needed by mouth for moderate pain. 04/18/17   Max Sane, MD  vancomycin IVPB Inject 1,750 mg daily into the vein. Indication:  Thoracic spine discitis Last Day of Therapy:  05/23/17 Labs - Sunday/Monday:  CBC/D, BMP, and vancomycin trough. Labs - Thursday:  BMP and vancomycin trough Labs - Every other week:  ESR and CRP 04/19/17 05/23/17  Max Sane, MD  vitamin B-12 (CYANOCOBALAMIN) 1000 MCG tablet Take 1,000 mcg by mouth daily.    [provider]    Allergies  Allergen Reactions  . Lisinopril Swelling    Causes tongue swelling    Family History  Problem Relation Age of Onset  . Cancer Mother   . Diabetes Mother   . Heart attack Father     Social History Social History   Tobacco Use  . Smoking status: Never Smoker  . Smokeless tobacco: Never Used  Substance Use Topics  . Alcohol use: No  . Drug use: No    Review of  Systems Constitutional: Negative for fever. Eyes: Negative for visual complaints ENT: Negative for congestion Cardiovascular: Negative for chest pain. Respiratory: Negative for shortness of breath. Gastrointestinal: Negative for abdominal pain, vomiting positive for constipation. Genitourinary: Negative for dysuria. Musculoskeletal: Positive for back pain, chronic Skin: Patient states yeast infection rash to her groin Neurological: Negative for headache All other ROS negative  ____________________________________________   PHYSICAL EXAM:  VITAL SIGNS: ED Triage Vitals  Enc Vitals Group     BP 06/11/17 1318 126/86     Pulse Rate 06/11/17 1318 64     Resp 06/11/17 1318 18     Temp 06/11/17 1318 98.6 F (37 C)     Temp Source 06/11/17 1318 Oral     SpO2 06/11/17 1318 100 %     Weight 06/11/17 1323 244  lb 12.8 oz (111 kg)     Height 06/11/17 1323 5' 8" (1.727 m)     Head Circumference --      Peak Flow --      Pain Score 06/11/17 1317 10     Pain Loc --      Pain Edu? --      Excl. in [place]? --    Constitutional: Alert and oriented.  Lying in bed, no acute distress. Eyes: Normal exam ENT   Head: Normocephalic and atraumatic   Mouth/Throat: Mucous membranes are moist. Cardiovascular: Normal rate, regular rhythm. No murmur Respiratory: Normal respiratory effort without tachypnea nor retractions. Breath sounds are clear Gastrointestinal: Soft and nontender. No distention.  Obese. Musculoskeletal: No significant edema in lower extremities, both extremities are externally rotated, very minimal strength in lower extremities. Neurologic:  Normal speech and language.  States generalized weakness, good range of motion bilateral upper extremities, significant weakness in bilateral lower extremities. Skin:  Skin is warm, dry.  Patient does have erythema around her groin consistent with possible Candida. Psychiatric: Tearful at  times. ____________________________________________  INITIAL IMPRESSION / ASSESSMENT AND PLAN / ED COURSE  Pertinent labs & imaging results that were available during my care of the patient were reviewed by me and considered in my medical decision making (see chart for details).  Patient presents to the emergency department for medical evaluation.  Differential would include inability to adequately care for herself at home, failure to thrive, needing assistance for ADLs.  Patient is currently on antibiotics via a PICC line.  I reviewed the patient's records including discharge summary from [facility].  Patient is unable to ambulate, needs significant assistance to get up, sit up or placed in wheelchair.  Family has been unable to transport to doctor appointments.  We will check labs, urinalysis.  Patient does not appear to have any acute medical complaints, they are here because the son must return to [place] and can no longer care for his mother in her home and she is not able to adequately care for herself.  I discussed the patient with social work will be down to see the patient and family discussed possible options such is nursing home care, inhome home health, etc.  Patient's blood work has resulted so far largely at her baseline.  Son is now here with the patient.  Social worker is currently working with the patient for possible placement.  If they are unable to place, they will likely discharge home with home resources.  Patient care signed out to oncoming physician.  ____________________________________________   FINAL CLINICAL IMPRESSION(S) / ED DIAGNOSES  Generalized weakness    Harvest Dark, MD 06/11/17 1516

## 2017-06-11 NOTE — ED Notes (Signed)
Called for transport by [facility] to [facility], [place], [place].  [phone]

## 2017-06-11 NOTE — ED Notes (Signed)

## 2017-06-11 NOTE — Progress Notes (Signed)
Clinical Education officer, museum (CSW) met with patient and her son Angelica Chessman and daughter in law were at bedside. Patient was alert and oriented X4. Patient reported that she went to [facility] for IV ABX and stayed for 20 days and discharged home with her husband. Per patient she has been home for 1 month now. Per son patient's husband is now at [facility] needing open heart surgery and unable to care for her. Per son him and his wife live in [place] and can't provide 24/7 care for patient and came up to assist patient temporarily until they can get her placed in a nursing home. Per son patient does not walk at baseline and he applied for long term care medicaid at [facility] yesterday. Son has Otila Kluver Reece's card with DSS. Per son DSS worker stated that patient will qualify for medicaid. Son reported that he is desperate to get patient placed and will take any facility that is available. Son reported that patient has a PICC line and has 1 dose if vancomycin left.   CSW discussed case with CSW Surveyor, quantity who approved a 5 day LOG. CSW contacted [facility] admissions coordinator at [facility] and asked her to review referral. Per Chrys Racer they can accept patient today from the ED on a 5 day LOG and will try to get [facility] SNF authorization on Monday. Per Chrys Racer patient's son will have to bring vancomycin to the facility tonight and sign admissions paper work.   Patient and her son are agreeable for patient to D/C to [facility] today. Per son he will bring the vancomycin to the facility and sign admission paper work today. RN will call report and arrange EMS for transport. MD aware of above. Please reconsult if future social work needs arise. CSW signing off.   [employer], LCSW [phone]

## 2017-06-11 NOTE — ED Provider Notes (Signed)
Notified by Education officer, museum the patient has been accepted to the [facility]. I reviewed the controlled substances reporting system and provided a written prescription for tramadol that she has not had represcribed to her since she last obtained it November 2018. Plan to follow up sooner primary care and infectious disease for reevaluation of her PICC line and spinal osteomyelitis. Medically stable, normal vital signs.   Carrie Mew, MD 06/11/17 [phone]

## 2017-06-11 NOTE — ED Notes (Signed)
Patient states she is not sure whether she has gone four or five days without BM

## 2018-04-12 IMAGING — CT CT ANGIO CHEST
2 of 6 series · 17 of 46 positions shown · IV contrast (APPLIED)
Comparison: Chest radiograph performed 04/11/2017, CT of the
abdomen and pelvis performed 04/10/2017, and CTA of the chest
performed 09/04/2012

CLINICAL DATA: Acute onset of shortness of breath. Assess for
source of sepsis.

EXAM:
CT ANGIOGRAPHY CHEST WITH CONTRAST
TECHNIQUE: Multidetector CT imaging of the chest was performed using the
standard protocol during bolus administration of intravenous
contrast. Multiplanar CT image reconstructions and MIPs were
obtained to evaluate the vascular anatomy.
CONTRAST:  75mL 3RDUMN-JXB IOPAMIDOL (3RDUMN-JXB) INJECTION 76%

[Series 5: thins · axial · 0.70mm/px · z∈[-958,-646]mm · 14 of 343 slices shown]
[im 15/343  lung]
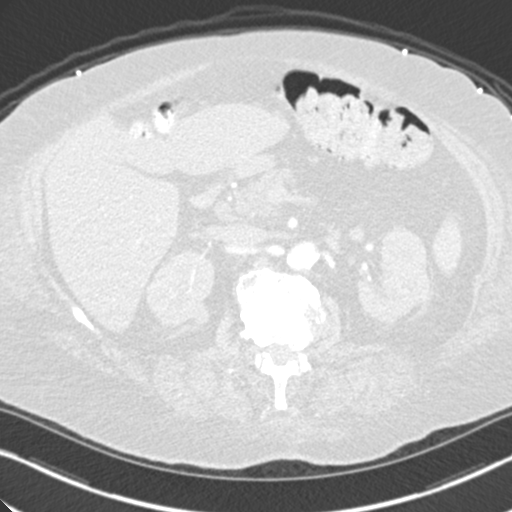
[im 45/343  soft-tissue]
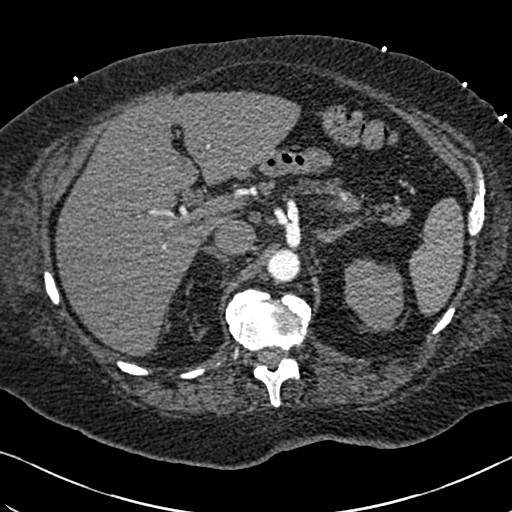
[im 60/343  lung]
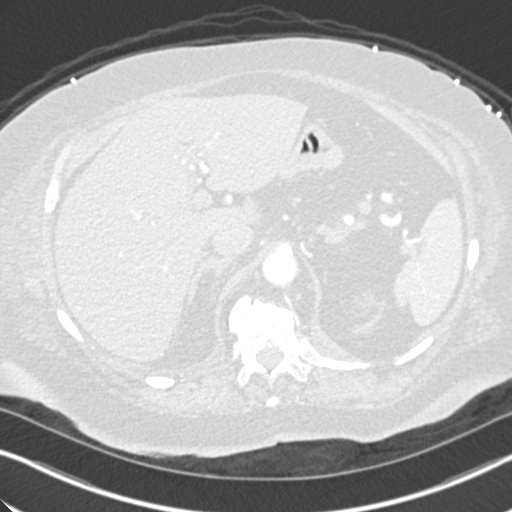
[im 90/343  soft-tissue]
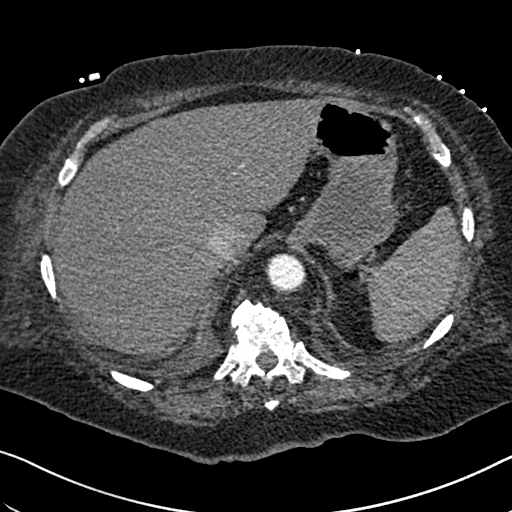
[im 119/343  lung]
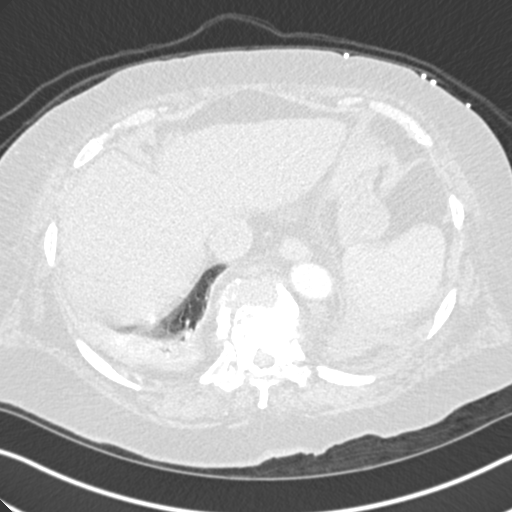
[im 134/343  soft-tissue]
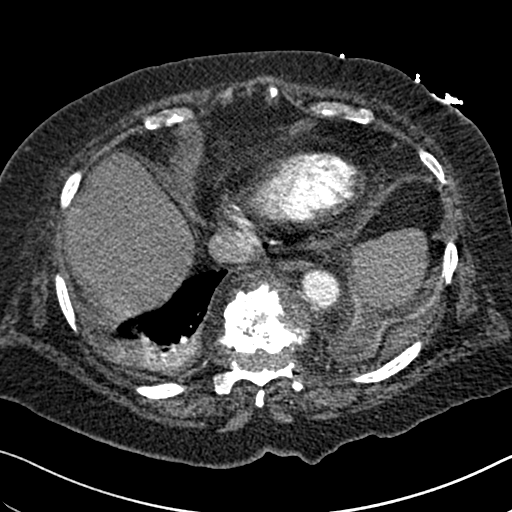
[im 164/343  lung]
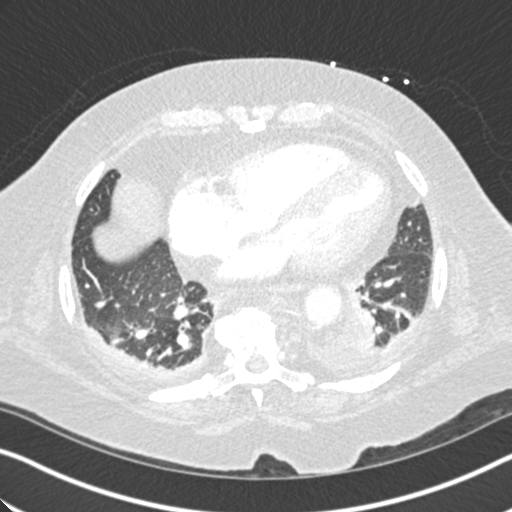
[im 179/343  soft-tissue]
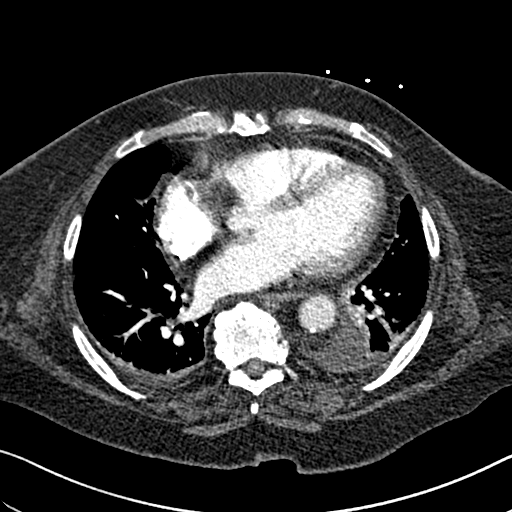
[im 209/343  lung]
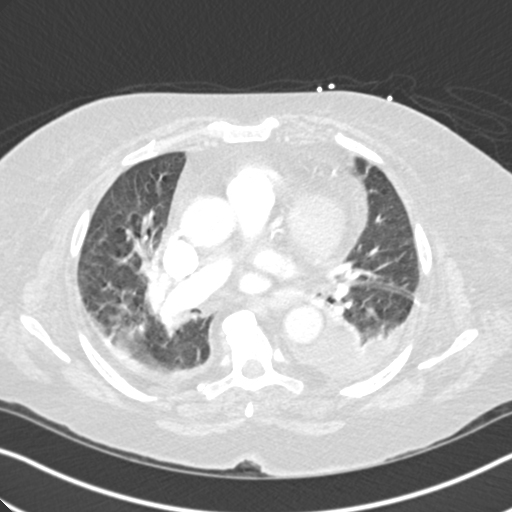
[im 224/343  soft-tissue]
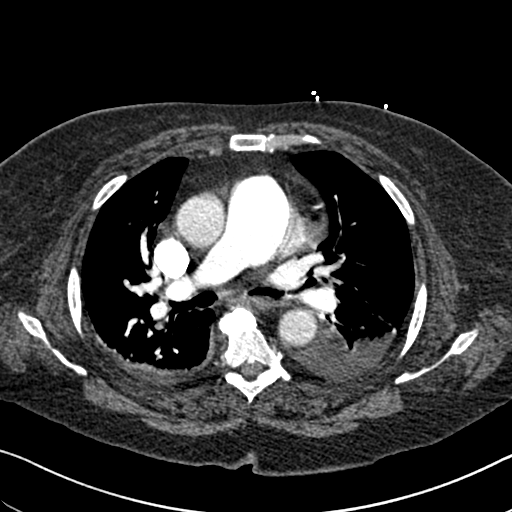
[im 253/343  lung]
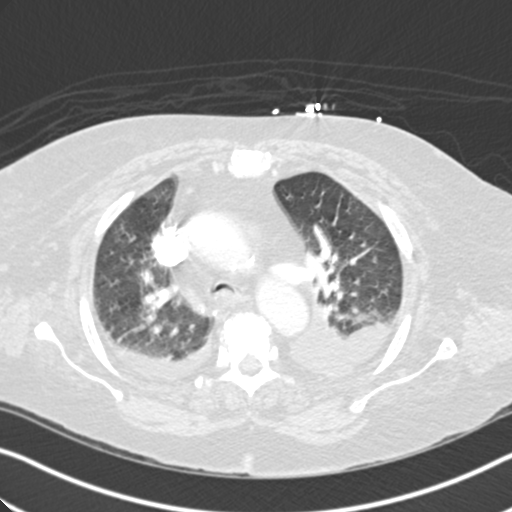
[im 283/343  soft-tissue]
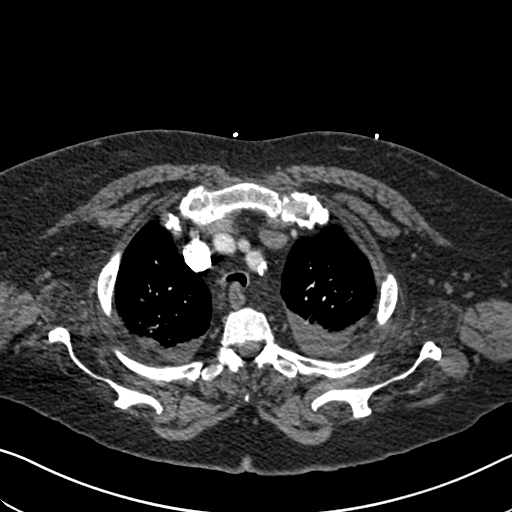
[im 298/343  lung]
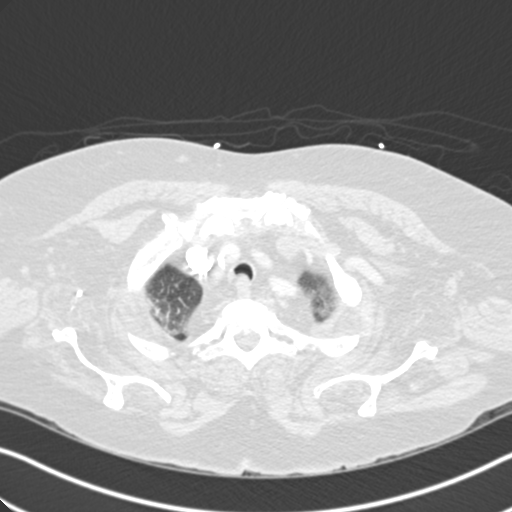
[im 328/343  soft-tissue]
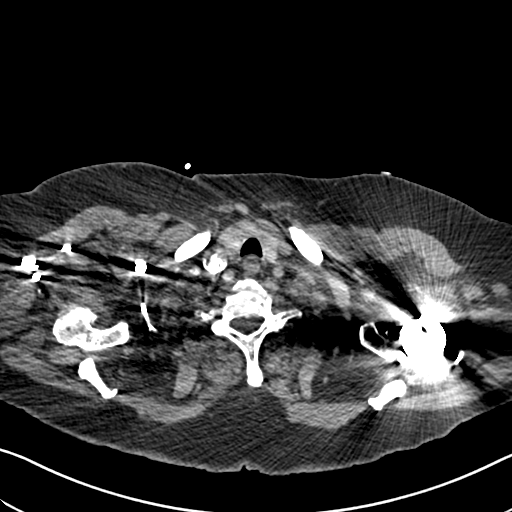

[Series 7: coronal mpr · coronal · 0.67mm/px · 3 of 92 slices shown]
[im 23/92  soft-tissue]
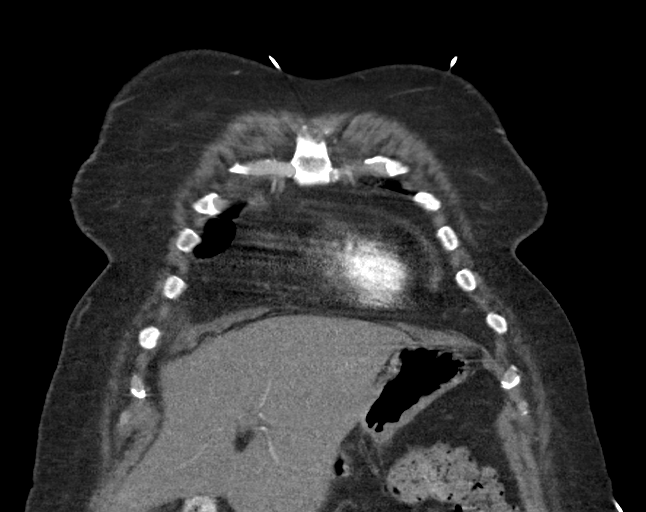
[im 46/92  soft-tissue]
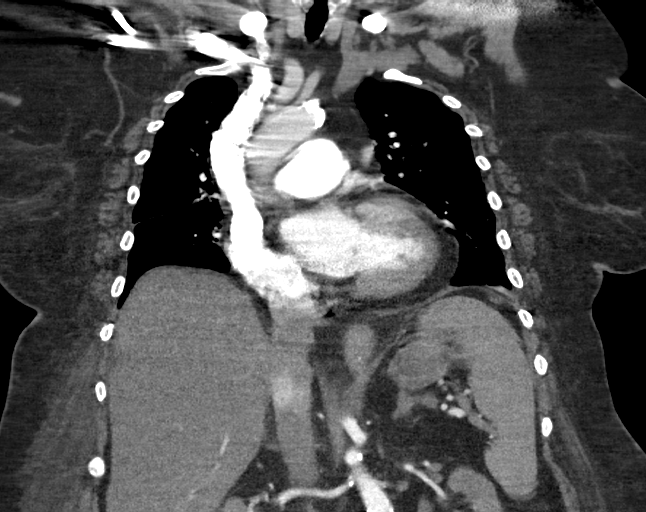
[im 69/92  soft-tissue]
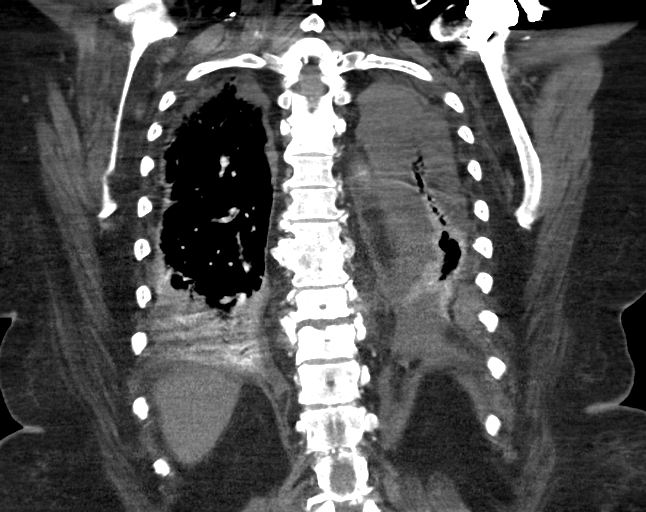

[17 of 46 positions shown; findings below may reference images not displayed]

FINDINGS: Cardiovascular:  There is no evidence of pulmonary embolus.

Diffuse coronary artery calcifications are seen. The heart is mildly
enlarged. Scattered calcification is noted along the aortic arch and
descending thoracic aorta. Scattered calcification is noted along
the proximal great vessels.

Mediastinum/Nodes: Tracheomalacia is noted. The mediastinum is
otherwise unremarkable. No mediastinal lymphadenopathy is seen. No
pericardial effusion is identified. The visualized portions of the
thyroid gland are unremarkable. No axillary lymphadenopathy is
appreciated.

Lungs/Pleura: Trace right and small left pleural effusions are
noted. Underlying right basilar airspace opacity may reflect
atelectasis or possibly mild infection. No pneumothorax is seen. No
dominant mass is identified.

Upper Abdomen: The visualized portions of the liver and spleen are
unremarkable. The patient is status post cholecystectomy, with clips
noted along the gallbladder fossa. The visualized portions of the
pancreas and adrenal glands are within normal limits. Nonspecific
perinephric stranding is noted bilaterally. A right renal cyst is
noted.

Musculoskeletal: There is prominent vague soft tissue density
tracking about the intervertebral disc space at T9-T10, with new
significant widening of the disc space and mild anterolisthesis of
T9 on T10, suspicious for discitis and osteomyelitis. This is
largely new from 2242. There is underlying chronic osseous fusion at
T7 and T8, and prominent anterior bridging osteophytes along the
lower thoracic spine. The visualized musculature is unremarkable in
appearance.

Review of the MIP images confirms the above findings.
IMPRESSION: 1. No evidence of pulmonary embolus.
2. Prominent vague soft tissue density tracking about the
intervertebral disc space at T9-T10, with new significant widening
of the disc space since two days ago and mild anterolisthesis of T9
on T10, raising concern for relatively severe acute discitis and
osteomyelitis. This is thought to reflect the source of infection,
given the patient's sepsis. Underlying abscess cannot be excluded.
Would correlate for clinical evidence of encroachment on the spinal
canal, and recommend MRI of the thoracic spine with contrast for
further evaluation if the patient can tolerate the study.
3. Trace right and small left pleural effusion. Underlying right
basilar airspace opacity may reflect atelectasis or possibly mild
infection.
4. Diffuse coronary artery calcifications.  Mild cardiomegaly.
5. Tracheomalacia noted.
6. Right renal cyst seen.

These results were called by telephone at the time of interpretation
on 04/12/2017 at [DATE] to Nursing at the [HOSPITAL] ICU,
who verbally acknowledged these results.

## 2018-04-16 IMAGING — DX DG CHEST 1V PORT
1 series · 1 of 1 positions shown · non-contrast
Comparison: 04/12/2017

CLINICAL DATA: Cough, shortness of Breath

EXAM:
PORTABLE CHEST 1 VIEW

[chest ap]
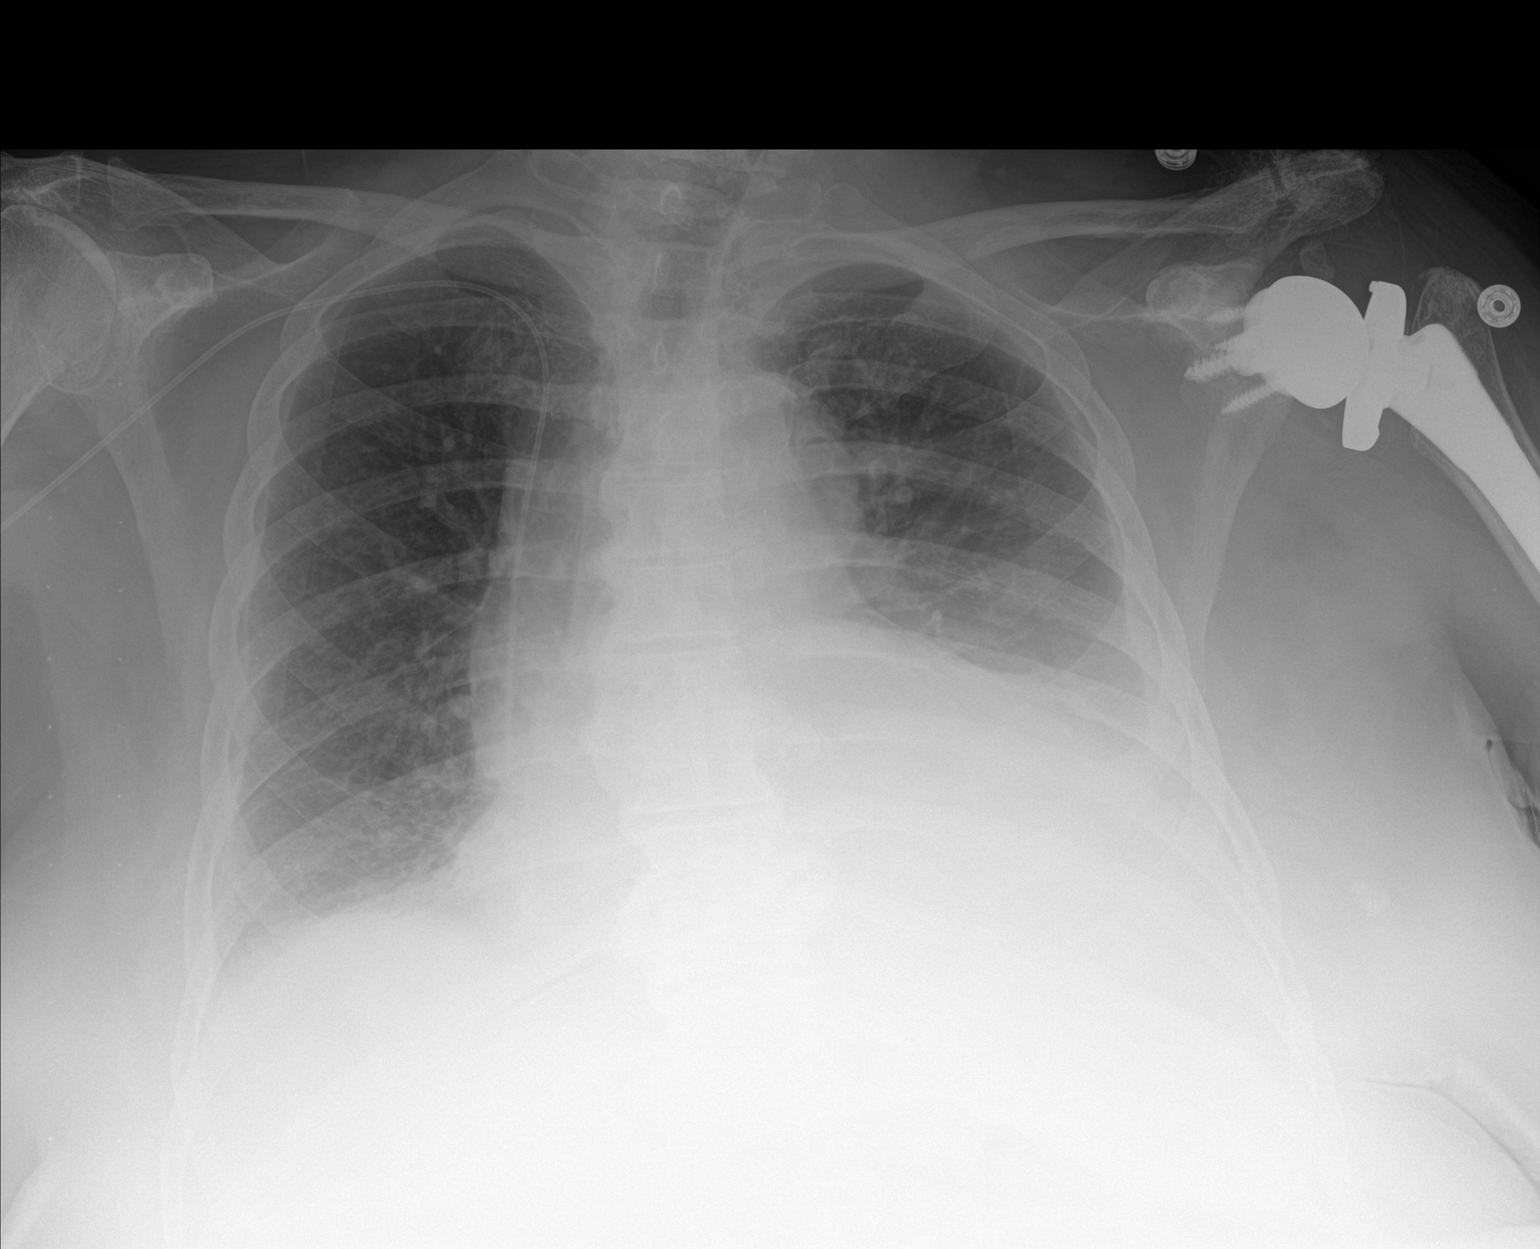

[1 of 1 positions shown; findings below may reference images not displayed]

FINDINGS: Right PICC line tip is at the cavoatrial junction. Cardiomegaly.
Bibasilar opacities, likely atelectasis. Moderate left pleural
effusion. No overt edema.
IMPRESSION: Cardiomegaly. Moderate left pleural effusion. Bibasilar atelectasis.

## 2024-07-01 DEATH — deceased
